# Patient Record
Sex: Female | Born: 1950 | Race: Black or African American | Hispanic: No | Marital: Married | Smoking: Never smoker
Health system: Southern US, Community
[De-identification: ages and names within clinical notes are randomized; demographics above are authoritative.]

## PROBLEM LIST (undated history)

## (undated) DIAGNOSIS — I639 Cerebral infarction, unspecified: Secondary | ICD-10-CM

## (undated) DIAGNOSIS — R531 Weakness: Secondary | ICD-10-CM

## (undated) DIAGNOSIS — J40 Bronchitis, not specified as acute or chronic: Secondary | ICD-10-CM

## (undated) DIAGNOSIS — Z803 Family history of malignant neoplasm of breast: Secondary | ICD-10-CM

## (undated) DIAGNOSIS — F32A Depression, unspecified: Secondary | ICD-10-CM

## (undated) DIAGNOSIS — R42 Dizziness and giddiness: Secondary | ICD-10-CM

## (undated) DIAGNOSIS — J029 Acute pharyngitis, unspecified: Secondary | ICD-10-CM

## (undated) DIAGNOSIS — N189 Chronic kidney disease, unspecified: Secondary | ICD-10-CM

## (undated) DIAGNOSIS — F329 Major depressive disorder, single episode, unspecified: Secondary | ICD-10-CM

## (undated) DIAGNOSIS — J349 Unspecified disorder of nose and nasal sinuses: Secondary | ICD-10-CM

## (undated) DIAGNOSIS — E785 Hyperlipidemia, unspecified: Secondary | ICD-10-CM

## (undated) DIAGNOSIS — I69359 Hemiplegia and hemiparesis following cerebral infarction affecting unspecified side: Secondary | ICD-10-CM

## (undated) DIAGNOSIS — I10 Essential (primary) hypertension: Secondary | ICD-10-CM

## (undated) DIAGNOSIS — G4730 Sleep apnea, unspecified: Secondary | ICD-10-CM

## (undated) DIAGNOSIS — M1990 Unspecified osteoarthritis, unspecified site: Secondary | ICD-10-CM

## (undated) DIAGNOSIS — R0602 Shortness of breath: Secondary | ICD-10-CM

## (undated) DIAGNOSIS — C649 Malignant neoplasm of unspecified kidney, except renal pelvis: Secondary | ICD-10-CM

## (undated) DIAGNOSIS — I69398 Other sequelae of cerebral infarction: Principal | ICD-10-CM

## (undated) DIAGNOSIS — I219 Acute myocardial infarction, unspecified: Secondary | ICD-10-CM

## (undated) HISTORY — DX: Hemiplegia and hemiparesis following cerebral infarction affecting unspecified side: I69.359

## (undated) HISTORY — DX: Unspecified disorder of nose and nasal sinuses: J34.9

## (undated) HISTORY — DX: Depression, unspecified: F32.A

## (undated) HISTORY — DX: Acute myocardial infarction, unspecified: I21.9

## (undated) HISTORY — DX: Unspecified osteoarthritis, unspecified site: M19.90

## (undated) HISTORY — DX: Hyperlipidemia, unspecified: E78.5

## (undated) HISTORY — DX: Shortness of breath: R06.02

## (undated) HISTORY — DX: Dizziness and giddiness: R42

## (undated) HISTORY — DX: Major depressive disorder, single episode, unspecified: F32.9

## (undated) HISTORY — DX: Chronic kidney disease, unspecified: N18.9

## (undated) HISTORY — DX: Other sequelae of cerebral infarction: I69.398

## (undated) HISTORY — DX: Cerebral infarction, unspecified: I63.9

## (undated) HISTORY — DX: Family history of malignant neoplasm of breast: Z80.3

## (undated) HISTORY — DX: Weakness: R53.1

## (undated) HISTORY — DX: Bronchitis, not specified as acute or chronic: J40

## (undated) HISTORY — DX: Sleep apnea, unspecified: G47.30

## (undated) HISTORY — DX: Essential (primary) hypertension: I10

## (undated) HISTORY — PX: CARPAL TUNNEL RELEASE: SHX101

## (undated) HISTORY — DX: Acute pharyngitis, unspecified: J02.9

---

## 1997-10-27 HISTORY — PX: CHOLECYSTECTOMY: SHX55

## 1999-10-28 HISTORY — PX: OTHER SURGICAL HISTORY: SHX169

## 1999-10-28 HISTORY — PX: NEPHRECTOMY: SHX65

## 2000-10-27 HISTORY — PX: ABDOMINAL HYSTERECTOMY: SHX81

## 2002-10-27 DIAGNOSIS — I219 Acute myocardial infarction, unspecified: Secondary | ICD-10-CM

## 2002-10-27 HISTORY — DX: Acute myocardial infarction, unspecified: I21.9

## 2003-08-08 ENCOUNTER — Inpatient Hospital Stay (HOSPITAL_COMMUNITY): Admission: EM | Admit: 2003-08-08 | Discharge: 2003-08-12 | Payer: Self-pay | Admitting: Emergency Medicine

## 2003-08-08 ENCOUNTER — Encounter: Payer: Self-pay | Admitting: Cardiology

## 2003-08-09 ENCOUNTER — Encounter: Payer: Self-pay | Admitting: Cardiology

## 2003-10-02 ENCOUNTER — Encounter (HOSPITAL_COMMUNITY): Admission: RE | Admit: 2003-10-02 | Discharge: 2003-12-26 | Payer: Self-pay | Admitting: Cardiology

## 2003-11-07 ENCOUNTER — Emergency Department (HOSPITAL_COMMUNITY): Admission: EM | Admit: 2003-11-07 | Discharge: 2003-11-07 | Payer: Self-pay | Admitting: Emergency Medicine

## 2004-05-03 ENCOUNTER — Encounter: Admission: RE | Admit: 2004-05-03 | Discharge: 2004-05-03 | Payer: Self-pay | Admitting: Internal Medicine

## 2004-08-07 ENCOUNTER — Inpatient Hospital Stay (HOSPITAL_COMMUNITY): Admission: EM | Admit: 2004-08-07 | Discharge: 2004-08-08 | Payer: Self-pay | Admitting: Emergency Medicine

## 2004-10-13 ENCOUNTER — Emergency Department (HOSPITAL_COMMUNITY): Admission: EM | Admit: 2004-10-13 | Discharge: 2004-10-13 | Payer: Self-pay | Admitting: Emergency Medicine

## 2004-12-20 ENCOUNTER — Ambulatory Visit (HOSPITAL_COMMUNITY): Admission: RE | Admit: 2004-12-20 | Discharge: 2004-12-20 | Payer: Self-pay | Admitting: Internal Medicine

## 2005-02-23 ENCOUNTER — Inpatient Hospital Stay (HOSPITAL_COMMUNITY): Admission: EM | Admit: 2005-02-23 | Discharge: 2005-02-25 | Payer: Self-pay | Admitting: Emergency Medicine

## 2005-04-15 ENCOUNTER — Encounter: Admission: RE | Admit: 2005-04-15 | Discharge: 2005-04-15 | Payer: Self-pay | Admitting: Otolaryngology

## 2005-12-25 ENCOUNTER — Encounter: Admission: RE | Admit: 2005-12-25 | Discharge: 2005-12-25 | Payer: Self-pay | Admitting: Cardiology

## 2005-12-30 ENCOUNTER — Encounter: Admission: RE | Admit: 2005-12-30 | Discharge: 2005-12-30 | Payer: Self-pay | Admitting: Cardiology

## 2006-05-21 ENCOUNTER — Emergency Department (HOSPITAL_COMMUNITY): Admission: EM | Admit: 2006-05-21 | Discharge: 2006-05-21 | Payer: Self-pay | Admitting: Emergency Medicine

## 2006-10-14 ENCOUNTER — Encounter: Admission: RE | Admit: 2006-10-14 | Discharge: 2006-10-14 | Payer: Self-pay | Admitting: Cardiology

## 2006-11-04 ENCOUNTER — Ambulatory Visit (HOSPITAL_COMMUNITY): Admission: RE | Admit: 2006-11-04 | Discharge: 2006-11-04 | Payer: Self-pay | Admitting: Gastroenterology

## 2006-11-04 ENCOUNTER — Encounter (INDEPENDENT_AMBULATORY_CARE_PROVIDER_SITE_OTHER): Payer: Self-pay | Admitting: Specialist

## 2006-11-16 ENCOUNTER — Ambulatory Visit: Payer: Self-pay | Admitting: Hematology and Oncology

## 2006-11-18 ENCOUNTER — Other Ambulatory Visit: Admission: RE | Admit: 2006-11-18 | Discharge: 2006-11-18 | Payer: Self-pay | Admitting: Hematology and Oncology

## 2006-11-18 ENCOUNTER — Encounter: Payer: Self-pay | Admitting: Hematology and Oncology

## 2006-11-18 LAB — CBC WITH DIFFERENTIAL/PLATELET
Eosinophils Absolute: 0.5 10*3/uL (ref 0.0–0.5)
HCT: 41.4 % (ref 34.8–46.6)
LYMPH%: 30.1 % (ref 14.0–48.0)
MONO#: 1.4 10*3/uL — ABNORMAL HIGH (ref 0.1–0.9)
NEUT#: 8.2 10*3/uL — ABNORMAL HIGH (ref 1.5–6.5)
NEUT%: 56 % (ref 39.6–76.8)
Platelets: 301 10*3/uL (ref 145–400)
WBC: 14.7 10*3/uL — ABNORMAL HIGH (ref 3.9–10.0)
lymph#: 4.4 10*3/uL — ABNORMAL HIGH (ref 0.9–3.3)

## 2006-11-20 LAB — COMPREHENSIVE METABOLIC PANEL
ALT: 35 U/L (ref 0–35)
CO2: 29 mEq/L (ref 19–32)
Calcium: 9.5 mg/dL (ref 8.4–10.5)
Chloride: 98 mEq/L (ref 96–112)
Creatinine, Ser: 0.82 mg/dL (ref 0.40–1.20)
Glucose, Bld: 214 mg/dL — ABNORMAL HIGH (ref 70–99)
Sodium: 142 mEq/L (ref 135–145)
Total Bilirubin: 0.5 mg/dL (ref 0.3–1.2)
Total Protein: 7.8 g/dL (ref 6.0–8.3)

## 2006-11-20 LAB — PROTEIN ELECTROPHORESIS, SERUM
Alpha-2-Globulin: 11 % (ref 7.1–11.8)
Gamma Globulin: 21.1 % — ABNORMAL HIGH (ref 11.1–18.8)
Total Protein, Serum Electrophoresis: 7.8 g/dL (ref 6.0–8.3)

## 2006-11-20 LAB — LACTATE DEHYDROGENASE: LDH: 158 U/L (ref 94–250)

## 2006-11-23 ENCOUNTER — Ambulatory Visit (HOSPITAL_COMMUNITY): Admission: RE | Admit: 2006-11-23 | Discharge: 2006-11-23 | Payer: Self-pay | Admitting: Hematology and Oncology

## 2006-11-24 LAB — FLOW CYTOMETRY

## 2007-08-27 ENCOUNTER — Encounter: Admission: RE | Admit: 2007-08-27 | Discharge: 2007-08-27 | Payer: Self-pay | Admitting: General Surgery

## 2007-09-03 ENCOUNTER — Ambulatory Visit (HOSPITAL_COMMUNITY): Admission: RE | Admit: 2007-09-03 | Discharge: 2007-09-03 | Payer: Self-pay | Admitting: General Surgery

## 2007-09-08 ENCOUNTER — Ambulatory Visit (HOSPITAL_BASED_OUTPATIENT_CLINIC_OR_DEPARTMENT_OTHER): Admission: RE | Admit: 2007-09-08 | Discharge: 2007-09-08 | Payer: Self-pay | Admitting: General Surgery

## 2007-09-08 ENCOUNTER — Encounter: Payer: Self-pay | Admitting: Internal Medicine

## 2007-09-19 ENCOUNTER — Ambulatory Visit: Payer: Self-pay | Admitting: Internal Medicine

## 2007-10-24 ENCOUNTER — Emergency Department (HOSPITAL_COMMUNITY): Admission: EM | Admit: 2007-10-24 | Discharge: 2007-10-24 | Payer: Self-pay | Admitting: Emergency Medicine

## 2007-11-04 ENCOUNTER — Ambulatory Visit: Payer: Self-pay | Admitting: Internal Medicine

## 2007-11-04 ENCOUNTER — Encounter: Payer: Self-pay | Admitting: Internal Medicine

## 2007-11-04 DIAGNOSIS — I219 Acute myocardial infarction, unspecified: Secondary | ICD-10-CM | POA: Insufficient documentation

## 2007-11-04 DIAGNOSIS — G4710 Hypersomnia, unspecified: Secondary | ICD-10-CM | POA: Insufficient documentation

## 2007-11-04 DIAGNOSIS — R29818 Other symptoms and signs involving the nervous system: Secondary | ICD-10-CM

## 2007-11-04 DIAGNOSIS — G4730 Sleep apnea, unspecified: Secondary | ICD-10-CM

## 2007-11-04 DIAGNOSIS — Z9089 Acquired absence of other organs: Secondary | ICD-10-CM

## 2007-11-04 DIAGNOSIS — J40 Bronchitis, not specified as acute or chronic: Secondary | ICD-10-CM

## 2007-11-04 DIAGNOSIS — Z905 Acquired absence of kidney: Secondary | ICD-10-CM | POA: Insufficient documentation

## 2007-11-08 ENCOUNTER — Encounter: Payer: Self-pay | Admitting: Internal Medicine

## 2008-01-21 ENCOUNTER — Ambulatory Visit: Payer: Self-pay | Admitting: Internal Medicine

## 2008-01-21 DIAGNOSIS — J309 Allergic rhinitis, unspecified: Secondary | ICD-10-CM | POA: Insufficient documentation

## 2008-02-17 ENCOUNTER — Encounter: Payer: Self-pay | Admitting: Internal Medicine

## 2008-04-19 ENCOUNTER — Encounter: Admission: RE | Admit: 2008-04-19 | Discharge: 2008-07-18 | Payer: Self-pay | Admitting: General Surgery

## 2008-05-09 ENCOUNTER — Ambulatory Visit (HOSPITAL_COMMUNITY): Admission: RE | Admit: 2008-05-09 | Discharge: 2008-05-10 | Payer: Self-pay | Admitting: General Surgery

## 2008-05-09 HISTORY — PX: LAPAROSCOPIC GASTRIC BANDING: SHX1100

## 2008-05-22 ENCOUNTER — Ambulatory Visit: Payer: Self-pay | Admitting: Internal Medicine

## 2008-08-03 ENCOUNTER — Encounter: Admission: RE | Admit: 2008-08-03 | Discharge: 2008-08-03 | Payer: Self-pay | Admitting: General Surgery

## 2008-10-02 ENCOUNTER — Encounter: Admission: RE | Admit: 2008-10-02 | Discharge: 2008-10-02 | Payer: Self-pay | Admitting: General Surgery

## 2008-12-18 ENCOUNTER — Ambulatory Visit: Payer: Self-pay | Admitting: Internal Medicine

## 2008-12-21 ENCOUNTER — Encounter: Admission: RE | Admit: 2008-12-21 | Discharge: 2008-12-21 | Payer: Self-pay | Admitting: General Surgery

## 2009-12-26 ENCOUNTER — Inpatient Hospital Stay (HOSPITAL_COMMUNITY): Admission: EM | Admit: 2009-12-26 | Discharge: 2009-12-28 | Payer: Self-pay | Admitting: Emergency Medicine

## 2009-12-27 ENCOUNTER — Encounter (INDEPENDENT_AMBULATORY_CARE_PROVIDER_SITE_OTHER): Payer: Self-pay | Admitting: Internal Medicine

## 2010-11-17 ENCOUNTER — Encounter: Payer: Self-pay | Admitting: General Surgery

## 2011-01-19 LAB — LIPID PANEL
Cholesterol: 214 mg/dL — ABNORMAL HIGH (ref 0–200)
Triglycerides: 74 mg/dL (ref <150)

## 2011-01-19 LAB — URINALYSIS, ROUTINE W REFLEX MICROSCOPIC
Bilirubin Urine: NEGATIVE
Ketones, ur: NEGATIVE mg/dL
Nitrite: NEGATIVE
Protein, ur: NEGATIVE mg/dL
Urobilinogen, UA: 1 mg/dL (ref 0.0–1.0)

## 2011-01-19 LAB — BASIC METABOLIC PANEL
Calcium: 9 mg/dL (ref 8.4–10.5)
Chloride: 103 mEq/L (ref 96–112)
Creatinine, Ser: 0.94 mg/dL (ref 0.4–1.2)
GFR calc Af Amer: >60 mL/min (ref >60)
Sodium: 136 mEq/L (ref 135–145)

## 2011-01-19 LAB — PROTIME-INR
INR: 1.02 (ref 0.00–1.49)
Prothrombin Time: 13.3 seconds (ref 11.6–15.2)

## 2011-01-19 LAB — CBC
MCV: 94.1 fL (ref 78.0–100.0)
RBC: 4.03 MIL/uL (ref 3.87–5.11)
RBC: 4.49 MIL/uL (ref 3.87–5.11)
RDW: 14.3 % (ref 11.5–15.5)
WBC: 9.1 10*3/uL (ref 4.0–10.5)
WBC: 9.5 10*3/uL (ref 4.0–10.5)

## 2011-01-19 LAB — POCT CARDIAC MARKERS
CKMB, poc: <1 ng/mL — ABNORMAL LOW (ref 1.0–8.0)
Myoglobin, poc: 69.6 ng/mL (ref 12–200)
Troponin i, poc: <0.05 ng/mL (ref 0.00–0.09)

## 2011-01-19 LAB — COMPREHENSIVE METABOLIC PANEL
BUN: 9 mg/dL (ref 6–23)
CO2: 25 mEq/L (ref 19–32)
Calcium: 9.3 mg/dL (ref 8.4–10.5)
Chloride: 101 mEq/L (ref 96–112)
Creatinine, Ser: 0.92 mg/dL (ref 0.4–1.2)
GFR calc non Af Amer: >60 mL/min (ref >60)
Total Bilirubin: 0.7 mg/dL (ref 0.3–1.2)

## 2011-01-19 LAB — GLUCOSE, CAPILLARY
Glucose-Capillary: 106 mg/dL — ABNORMAL HIGH (ref 70–99)
Glucose-Capillary: 115 mg/dL — ABNORMAL HIGH (ref 70–99)
Glucose-Capillary: 167 mg/dL — ABNORMAL HIGH (ref 70–99)
Glucose-Capillary: 205 mg/dL — ABNORMAL HIGH (ref 70–99)

## 2011-01-19 LAB — CK TOTAL AND CKMB (NOT AT ARMC): Total CK: 106 U/L (ref 7–177)

## 2011-01-19 LAB — URINE MICROSCOPIC-ADD ON

## 2011-01-19 LAB — HEMOGLOBIN A1C: Mean Plasma Glucose: 177 mg/dL

## 2011-01-19 LAB — APTT: aPTT: 22 seconds — ABNORMAL LOW (ref 24–37)

## 2011-03-11 NOTE — Op Note (Signed)
[name], [name]            ACCOUNT NO.:  [id number]   MEDICAL RECORD NO.:  [id number]          PATIENT TYPE:  AMB   LOCATION:  DAY                          FACILITY:  [facility]   PHYSICIAN:  Sharlet Salina T. Hoxworth, M.D.DATE OF BIRTH:  Nov 26, 1950   DATE OF PROCEDURE:  05/09/2008  DATE OF DISCHARGE:                               OPERATIVE REPORT   PREOPERATIVE DIAGNOSIS:  Morbid obesity.   POSTOPERATIVE DIAGNOSIS:  Morbid obesity.   SURGICAL PROCEDURES:  Placement of laparoscopic adjustable gastric band.   SURGEON:  Lorne Skeens. Hoxworth, M.D.   ANESTHESIA:  General.   ASSISTANT:  Dr. Luretha Murphy.   BRIEF HISTORY:  [name] is a 60 year old female with progressive  morbid obesity unresponsive to medical management and multiple  comorbidities.  Follow-up following extensive discussion detailed  elsewhere, we have elected to proceed with placement laparoscopic  adjustable gastric band for treatment of her morbid obesity.   DESCRIPTION OF OPERATION:  The patient brought to the operating room and  placed in supine position on the operating table and general anesthesia  was induced.  She received preoperative antibiotics and subcutaneous  heparin.  PAS were placed.  It was widely sterilely prepped and draped.  Correct patient and procedure were verified.  The patient had a previous  open splenectomy and nephrectomy with a large left subcostal incision.  I therefore gained access in the right upper quadrant midclavicular line  with 11 mm OptiVu trocar without difficulty and pneumoperitoneum  established.  There were fairly extensive adhesions to the anterior  abdominal wall and to the left upper quadrant incision but these were  omental adhesions and for the most part fairly filmy.  Under direct  vision I placed a 11 mm trocar just above and to the left of the  umbilicus and through this port took down the anterior adhesions of the  omentum until the stomach was exposed.   There was some adhesions up  around the fundus from the splenectomy, but not severe.  Through a 5-mm  site a Davidson retractor was placed to the epigastrium and the left  lobe of liver elevated with good exposure.  An additional 15 mm trocar  was placed well laterally in the right upper quadrant.  Another 11 mm  trocar was placed in the left upper quadrant.  The angle of His was  dissected and the peritoneum overlying the left crus incised and the  finger dissector used to dissect bluntly back toward the retrogastric  area.  The pars flaccida was then opened along the avascular area and  the base of the right crus identified in an area of crossing fat.  This  was opened and the finger dissector used to bluntly go retrogastric  without difficulty and it was deployed up to the previous dissected area  at the angle of His.  An AP Standard flush band system was introduced  and the tubing passed into the finger dissector which was then brought  back retrogastric and the band brought back around the stomach without  difficulty.  The calibration tube was placed orally into the stomach and  the band was buckled into place without any undue tension.  The  calibration tube was removed and holding the tubing toward the patient's  feet exposing the small gastric pouch, the fundus was imbricated up over  the band to the small gastric pouch with three interrupted 2-0 Ethibond  sutures.  This did require a little further mobilization of the fundus  which was done without difficulty.  The operative site was inspected for  hemostasis which appeared complete.  The band was in good position and  rotated freely.  The tubing was brought out through the right mid trocar  site.  The Nathanson's retractor was removed under direct vision.  All  CO2 was evacuated and trocars removed.  Right mid abdominal incision was  lengthened slightly and the anterior fascia exposed.  Four 2-0 Prolene  sutures were placed for  securing the port.  The tubing was cut, attached  the port which was then affixed to the anterior fascia with the sutures.  The tubing was seen to curve nicely into the abdomen.  All incisions  were irrigated with saline.  The subcu port site was closed with running  3-0 Vicryl and skin incisions were closed with subcuticular Monocryl and  Dermabond.  Sponge, needle and instrument counts were correct.  The  patient was taken recovery in good condition.      Lorne Skeens. Hoxworth, M.D.  Electronically Signed     BTH/MEDQ  D:  05/09/2008  T:  05/09/2008  Job:  [id number]

## 2011-03-11 NOTE — Procedures (Signed)
[name], [name]            ACCOUNT NO.:  [id number]   MEDICAL RECORD NO.:  [id number]          PATIENT TYPE:  OUT   LOCATION:  SLEEP CENTER                 FACILITY:  [facility]   PHYSICIAN:  Clinton D. Maple Hudson, MD, FCCP, FACPDATE OF BIRTH:  Mar 02[birth year] 1953   DATE OF STUDY:  09/08/2007                            NOCTURNAL POLYSOMNOGRAM   REFERRING PHYSICIAN:  Sharlet Salina T. Hoxworth, M.D.   INDICATIONS FOR PROCEDURE:  Hypersomnia with sleep apnea.   RESULTS:  Epward sleepiness score 15/24. BMI 44.8. Weight 237 pounds.  Height 61 inches. Neck 14 inches.   HOME MEDICATIONS:  Listed and reviewed.   A previous outside sleep report from January 11, 2003 recorded an AHI of  20 per hour with desaturation to 73% and patient reportedly subsequently  was fitted with CPAP, now for re-evaluation, anticipating surgery.   SLEEP ARCHITECTURE:  Total sleep time 381 minutes with sleep efficiency  86%. Stage 1 was 7%; Stage 2, 80%; Stage 3 absent. REM 12% of total  sleep time. Sleep latency 6 minutes, REM latency 269 minutes. Awake  after sleep onset 54 minutes. Arousal index 6.4.   RESPIRATORY DATA:  CPAP titration protocol. CPAP was titrated to 14 CWP,  AHI 1 per hour. A small Mirage Quattro full face mask was used with  heated humidifier.   OXYGEN DATA:  Mean oxygen saturation with CPAP was 95% on room air.   CARDIAC DATA:  Sinus rhythm with PVC's.   MOVEMENT/PARASOMNIA:  No significant movement disturbance. No bathroom  trips.   IMPRESSION/RECOMMENDATIONS:  1. Successful CPAP titration to 14 CWP, AHI 1 per hour. A small Mirage      Quattro mask was used with heated humidifier.  2. Diagnostic NP SG on January 11, 2003, elsewhere, had recorded an AHI      of 20 per hour.     Clinton D. Maple Hudson, MD, [facility], FACP  Diplomate, Biomedical engineer of Sleep Medicine  Electronically Signed    CDY/MEDQ  D:  09/19/2007 13:57:09  T:  09/19/2007 19:35:55  Job:  [id number]

## 2011-03-14 NOTE — H&P (Signed)
NAMEELESE, Erica NO.:  [id number]   MEDICAL RECORD NO.:  [id number]          PATIENT TYPE:  INP   LOCATION:  3733                         FACILITY:  [facility]   PHYSICIAN:  Cassell Clement, M.D. DATE OF BIRTH:  [birth year]   DATE OF ADMISSION:  08/07/2004  DATE OF DISCHARGE:                                HISTORY & PHYSICAL   CHIEF COMPLAINT:  Chest pain.   HISTORY:  This is a 61 year old married African American female admitted  with left chest pain. The pain started about 11 p.m. last evening while  watching television. It lasted for about four hours until she was able to  fall asleep. She tried taking nitroglycerin times two with no help. She  noted dyspnea. She noted radiation of the pain to the neck, down the left  arm, and through to the back.  There was no vomiting. There was mild nausea  and diaphoresis. The pain was better this morning, but then recurred at  about 11:00 this morning and she came to the emergency room. She does have a  past history of known coronary artery disease and had a lateral myocardial  infarction on August 11, 2003, with cardiac catheterization and stent by  Dr. Deborah Chalk. She was found to have an occluded or nearly occluded  intermediate coronary artery which was opened and stented. She had  subsequent adenosine Cardiolite stress test, February 06, 2004, which showed a  fixed lateral defect and equivocal lateral redistribution with a normal  ejection fraction of 63%.   Her family history reveals that her father died of a bad heart, diabetes,  and Alzheimer disease. Her mother is still living at age 8, but has high  blood pressure.   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 She is married and has two children. She does not use alcohol or  tobacco. She has no known medication allergies.   PAST MEDICAL HISTORY:  Diabetes for about 17 years and hypertension for  about 25 years. She has had a  prior left nephrectomy and splenectomy, but  she is unable to tell me what the cause of the surgery was. She has a  history of depression and sees a psychiatrist, Dr. Raquel James. The remainder of  the review of systems is negative in detail. She does have a history of GERD  and is on Protonix.   PHYSICAL EXAMINATION:  VITAL SIGNS: Blood pressure 122/60, pulse 70 and  regular, respirations normal.  GENERAL: This is an obese woman in no acute distress.  HEENT/NECK: Unremarkable. Carotids normal. Jugular venous pressure normal.  Thyroid normal.  CHEST: Clear.  HEART: Quiet precordium. There is no murmurs, rubs, or gallops. The patient  is very tender to palpation over the costochondral junction suggesting  costochondritis.  ABDOMEN: Soft. There is no mass. There is an old left upper quadrant scar.  EXTREMITIES: Good peripheral pulses. No edema, no phlebitis.   Electrocardiogram on admission showed minor nonspecific ST-T wave  abnormalities, but no acute abnormalities. Chest x-ray done in the emergency  room is pending my review.  LABORATORY DATA:  Negative cardiac enzymes, point of care twice in the  emergency room and once subsequent. She has normal renal function with a BUN  of 17, creatinine 1.0, and her blood sugar is 70.   IMPRESSION:  1.  Chest pain. Rule out myocardial infarction which is doubtful and I      suspect probable acute costochondritis.  2.  Coronary artery disease with a remote lateral wall myocardial infarction      with a history of catheterization and stent to intermediate coronary      vessel on August 11, 2003.  3.  Diabetes mellitus, insulin dependent.  4.  Hypertensive cardiovascular disease.  5.  Exogenous obesity.  6.  Depression.   DISPOSITION:  Will observe overnight on telemetry. Will continue with IV  heparin. Will get serial enzymes and EKGs. Will give her a trial of  ibuprofen for costochondritis. We will continue her Protonix to protect the   stomach.       TB/MEDQ  D:  08/07/2004  T:  08/07/2004  Job:  [id number]   cc:   Colleen Can. Deborah Chalk, M.D.  Fax: [phone]   Fleet Contras, M.D.  [address]  [place]  [place] [address]  Fax: [phone]

## 2011-03-14 NOTE — H&P (Signed)
[name], [name] NO.:  [id number]   MEDICAL RECORD NO.:  [id number]                   PATIENT TYPE:  INP   LOCATION:  2023                                 FACILITY:  [facility]   PHYSICIAN:  Colleen Can. Deborah Chalk, M.D.            DATE OF BIRTH:  1951/08/30   DATE OF ADMISSION:  08/08/2003  DATE OF DISCHARGE:                                HISTORY & PHYSICAL   CHIEF COMPLAINT:  Chest pain.   HISTORY OF PRESENT ILLNESS:  The patient is an obese black female who is 60  years old.  She presents to the emergency department from Dr. Malon Kindle  office on the evening of October 12th.  She has had chest pain that  basically started this past Saturday.  It has waxed and waned.  She  describes it as a tight-like feeling, but she does have chest wall  tenderness.  She has had associated shortness of breath as well as  diaphoresis.  It seemingly is exacerbated with exertion and improved with  rest.  She has been using Tums intermittently with only short-term relief.  She now presents to the emergency room for further evaluation and  management.   PAST MEDICAL/SURGICAL HISTORY:  1. Obesity.  2. Diabetes x10-12 years.  3. Hypertension.  4. Depression.  5. Previous left nephrectomy for cancer in approximately 2002.  6. Status post cholecystectomy, splenectomy, hysterectomy, appendectomy and     right hand carpal tunnel surgery.   ALLERGIES:  None.   MEDICATIONS:  1. Avandia 8 mg.  2. Glucovance 10-500 twice a day.  3. Lotrel 5-20.  4. Effexor 150.   FAMILY HISTORY:  Father died with diabetes and heart failure; mother is  alive in her 66's and has no report to heart problems.   SOCIAL HISTORY:  Nonsmoker, no alcohol use.  She has been married for 14  years.  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   REVIEW OF SYSTEMS:  She has had some chronic tinnitus, but no recent URI, no  fever, no fluid, no cough, no  abdominal pain.  She denies any injury to the  chest wall.  She has had no constipation, no diarrhea, no complaints of  edema, no syncope, presyncope and otherwise, review of systems is as noted  above and otherwise, unremarkable.   PHYSICAL EXAMINATION:  GENERAL:  A very pleasant, obese black female in no  acute distress.  VITAL SIGNS:  Blood pressure 144/85, heart rate 93, respirations 18.  She is  afebrile.  O2 saturation was 98%.  SKIN:  Warm and dry.  Color is unremarkable.  LUNGS:  Basically clear.  CARDIAC:  Regular rhythm.  She does have positive chest wall tenderness with  just minimal palpation.  ABDOMEN:  Obese, soft, positive bowel sounds, nontender.  EXTREMITIES:  Without edema.  NEUROLOGIC:  No gross focal deficit.   LABORATORY DATA:  Pertinent labs are pending.   OVERALL IMPRESSION:  1. Chest pain with both an exertional component as well as chest wall     tenderness, somewhat atypical for coronary disease.  2. Diabetes.  3. Hypertension.  4. Obesity.  5. History of nephrectomy for renal cell carcinoma.   PLAN:  She will be admitted, will follow her labs, will add PPI and  Darvocet.  Will initially plan for her to have Cardiolite study in light of  her soul kidney.  She may actually indeed, need cardiac catheterization.        Juanell Fairly C. Earl Gala, N.P.                 Colleen Can. Deborah Chalk, M.D.    LCO/MEDQ  D:  08/09/2003  T:  08/09/2003  Job:  [id number]   cc:   Jethro Bastos, M.D.  [address]  [place]  [place] [address]  Fax: [phone]

## 2011-03-14 NOTE — Discharge Summary (Signed)
NAMEVICI, NOVICK            ACCOUNT NO.:  [id number]   MEDICAL RECORD NO.:  [id number]          PATIENT TYPE:  INP   LOCATION:  3713                         FACILITY:  [facility]   PHYSICIAN:  Colleen Can. Deborah Chalk, M.D.DATE OF BIRTH:  [birth year]   DATE OF ADMISSION:  02/23/2005  DATE OF DISCHARGE:  02/25/2005                                 DISCHARGE SUMMARY   PRIMARY DISCHARGE DIAGNOSIS:  Chest pain with subsequent negative adenosine  Cardiolite study with no evidence of ischemia, ejection fraction is 58%.   SECONDARY DISCHARGE DIAGNOSES:  1.  Known atherosclerotic cardiovascular disease with previous history of a      lateral wall myocardial infarction suffered on August 11, 2003 with a      catheterization and stent placement per Dr. Roger Shelter to an      occluded intermediate coronary artery.  2.  Obesity.  3.  Diabetes.  4.  Gastroesophageal reflux disease.  5.  Hyperlipidemia.  6.  Anxiety and depression.  7.  Hypertension.  8.  Previous history of a left nephrectomy.  9.  History of splenectomy.   HISTORY OF PRESENT ILLNESS:  The patient is a pleasant obese black female  who presents to the emergency room on February 23, 2005 with complaints of  chest pain.  She has had chest pain off and on over the past several weeks  that has been occurring in her back as well as the left side of her chest  and in the midsternal region.  She describes it as a stabby-like discomfort,  but on the day prior to admission, she had developed chest pressure that was  similar to her previous chest pain syndrome.  She presented to the emergency  room, where her cardiac markers were negative x3; she was subsequently  admitted to the rule-out unit for further evaluation.   Please see the history and physical per Dr. Armanda Magic for further patient  presentation and profile.   LABORATORY DATA:  Her cardiac markers were negative.  I-STAT was basically  satisfactory.  CBC was normal.   Twelve-lead electrocardiogram showed nonspecific T wave abnormalities with  normal sinus rhythm.   HOSPITAL COURSE:  The patient was admitted.  She was placed on IV  nitroglycerin as well as subcutaneous Lovenox.  She ruled out negative for  myocardial infarction.  We proceeded on with stress Cardiolite study the  following day on Feb 24, 2005; this was tolerated well without any problems.  The results showed no evidence of ischemia.  Ejection fraction was 58%.  She  subsequently had her IV nitroglycerin weaned and had her oral nitrate  therapy restarted and today, on Feb 25, 2005, she is doing well and she had  no further bouts of chest pain.  She has been ambulatory without problems.  Her rhythm has remained stable.  She did have a brief episode of her heart  rate down into the low 30s while she was soundly sleeping and was basically  asymptomatic.  Overall, it is felt that she is stable for discharge and will  continue with outpatient evaluation.   DISCHARGE CONDITION:  Stable.   DISCHARGE MEDICINES:  1.  Actos 30 mg a day.  2.  Valium p.r.n.  3.  Plavix 75 mg a day.  4.  Wellbutrin 100 mg a day.  5.  Insulin as before.  6.  Glucovance 5/500 daily.  7.  Lotrel 10/20 daily.  8.  Protonix 40 mg a day.  9.  Imdur 60 mg a day.  10. Vytorin as before.  11. Atenolol as before.   DIET:  Her diet is to be diabetic, heart-healthy.   FOLLOWUP:  We will see her back in the office in approximately 1 week; she  is asked to call the office and schedule that appointment.  She is asked to  bring all of her medicine bottles to that appointment as well.      LC/MEDQ  D:  02/25/2005  T:  02/25/2005  Job:  [id number]

## 2011-03-14 NOTE — Op Note (Signed)
[name], [name]            ACCOUNT NO.:  [id number]   MEDICAL RECORD NO.:  [id number]          PATIENT TYPE:  AMB   LOCATION:  ENDO                         FACILITY:  [facility]   PHYSICIAN:  Shirley Friar, MDDATE OF BIRTH:  1951-10-07   DATE OF PROCEDURE:  11/04/2006  DATE OF DISCHARGE:                               OPERATIVE REPORT   PROCEDURE:  Colonoscopy.   INDICATION:  Family history of colon polyps.   MEDICATIONS:  Fentanyl 125 mcg IV, Versed 12.5 mg IV.   FINDINGS:  Rectal exam was normal.  The adult colonoscope was inserted  into an adequately-prepped colon and advanced to the cecum, where the  ileocecal valve and appendiceal orifice were identified.  During  intubation of the cecum a small amount of superficial punctate  hemorrhages occurred from scope suction.  A 3 mm sessile polyp was noted  in the cecal base, and this was were removed with cold biopsy.  On  careful withdrawal of the colonoscope a 2 mm the ascending colon polyp  was noted, and this was removed with cold biopsy.  No other polyps were  seen.  Shallow left-sided diverticulosis was noted.  Retroflexion was  unremarkable.   ASSESSMENT:  1. Colon polyps x2, status post cold biopsy removal.  2. Scattered left-sided diverticulosis.   PLAN:  1. Follow up on pathology.  2. Resume Plavix secondary to history of coronary stents as recommend      by cardiology.  3. Repeat colonoscopy in 3-5 years.  4. High-fiber diet.      Shirley Friar, MD  Electronically Signed     VCS/MEDQ  D:  11/04/2006  T:  11/04/2006  Job:  [id number]   cc:   Colleen Can. Deborah Chalk, M.D.  Fleet Contras, M.D.

## 2011-03-14 NOTE — Discharge Summary (Signed)
NAMEMAEBRY, OBRIEN                        ACCOUNT NO.:  [id number]   MEDICAL RECORD NO.:  [id number]                   PATIENT TYPE:  INP   LOCATION:  6524                                 FACILITY:  [facility]   PHYSICIAN:  Colleen Can. Deborah Chalk, M.D.            DATE OF BIRTH:  02-01-1951   DATE OF ADMISSION:  08/08/2003  DATE OF DISCHARGE:  08/12/2003                                 DISCHARGE SUMMARY   PRIMARY DISCHARGE DIAGNOSIS:  Lateral wall myocardial infarction with  occluded intermediate coronary artery; moderate diffuse 3 vessel disease per  cardiac catheterization; and successful Stent placement to the intermediate  coronary.   SECONDARY DISCHARGE DIAGNOSES:  1. Obesity.  2. Diabetes.  3. Hypertension.  4. History of depression.  5. Hypertension.   HISTORY OF PRESENT ILLNESS:  The patient is a very pleasant 60 year old  obese black female who presents to the emergency department from Dr.  Malon Kindle office on the evening of August 08, 2003. She has recently moved  to the [place] area from [place]. She had recently attended the [employer]coming game where she began having chest pain. It basically waxed and  waned. It was described as a tight-like feeling. She did have, however,  associated chest wall tenderness. She also noted to be shortness of breath.  Her discomfort was exacerbated with exertion as well as relieved with rest.  However, was also relieved with Tums for short term resolution. She  subsequently presented to the emergency room and was admitted for further  medical management.   Please see the dictated history and physical for further patient  presentation and profile.   LABORATORY DATA:  On admission, cardiac enzymes were negative x2. Troponin,  however, 0.36 to 0.36. Chemistries were within normal limits. CBC showed  white count 12.5, hemoglobin 12, hematocrit 36. Platelets were 338,000. PT  and PTT were unremarkable.   PROCEDURE:  Adenosine  Cardiolite showing a small fixed perfusion defect to  the mid anterior wall with a moderate focus of reversible perfusion adjacent  to the fixed defect. This extended from the mid anterior lateral wall to the  lateral wall of the apex. The left ventricular cavity size appeared to be  within normal limits. Ejection fraction was calculated to be 48.   HOSPITAL COURSE:  The patient was admitted to telemetry. She was placed on  nitrates as well as anti-coagulation with heparin. We proceeded initially  with Cardiolite study, with the results as noted above. In light of these  findings, we proceeded on with cardiac catheterization. The patient does  have prior history of nephrectomy and was hydrated and then we proceeded on  with cardiac catheterization on August 11, 2003. With that, she  demonstrated mild anterior hypokinesis. The left main was normal. The  intermediate was 100% occluded. The left anterior descending has diffuse  irregularities of the 60% to 70% nature. The right coronary artery is small  with diffuse  60% to 70% proximal narrowings. There is a large obtuse  marginal branch with 30% to 40% narrowing. A subsequent 2.5 x 18 mm Cypher  stent was placed to the intermediate with an overall satisfactory result  obtained. Post procedure, she was transferred back to telemetry and by  August 12, 2003, she was doing well without complaints. She was felt to be  a stable candidate for discharge.   CONDITION ON DISCHARGE:  Stable.   DISCHARGE MEDICATIONS:  She will resume her Effexor, Lotrel, and Avandia as  she was taking before. She is given the okay to restart her Glucovance as of  Monday following discharge. New medicines include:  1. Lipitor 10 q.d.  2. Plavix 75 mg a day for 6 months.  3. Aspirin daily.  4. Lopressor 25 b.i.d.  5. Nitroglycerin p.r.n.  6. Protonix 40 mg a day.   ACTIVITY:  To be light until she is seen back in the office. She is to place  an ice pack to the  groin as needed.   FOLLOW UP:  Will plan on seeing her back in 2 to 3 weeks and certainly  sooner if problems would arise in the interim.      Juanell Fairly C. Earl Gala, N.P.                 Colleen Can. Deborah Chalk, M.D.    LCO/MEDQ  D:  09/09/2003  T:  09/09/2003  Job:  [id number]   cc:   Jethro Bastos, M.D.  [address]  [place]  [place] [address]  Fax: [phone]   Gwen Pounds, M.D.  [address]  [place]  [place] [address]  Fax: [phone]

## 2011-03-14 NOTE — Cardiovascular Report (Signed)
NAMELENISE, JR                        ACCOUNT NO.:  [id number]   MEDICAL RECORD NO.:  [id number]                   PATIENT TYPE:  INP   LOCATION:  6524                                 FACILITY:  [facility]   PHYSICIAN:  Colleen Can. Deborah Chalk, M.D.            DATE OF BIRTH:  [birth year]   DATE OF PROCEDURE:  08/11/2003  DATE OF DISCHARGE:  08/12/2003                              CARDIAC CATHETERIZATION   HISTORY:  Ms. Rankin is a 60 year old female with a history of diabetes  and obesity.  She presents with approximately a three-day history of chest  pain.  She had positive troponins but with negative CK-MBs.  She is referred  for catheterization after having a positive adenosine Cardiolite study.   PROCEDURE:  Left heart catheterization with selective coronary angiography,  left ventricular angiography with stent placement in the intermediate  coronary.   TYPE AND SITE OF ENTRY:  Percutaneous, right femoral artery.   CATHETERS:  6-French 4-curved Judkins right and left coronary catheters; 6-  French pigtail ventriculographic catheter; 6-French FL4 guide, Hi-Torque  Floppy guidewire, 2.5 x 15-mm Maverick balloon, subsequently a 2.5 x 18-mm  CYPHER stent.   CONTRAST MATERIAL:  Omnipaque.   MEDICATIONS GIVEN PRIOR TO THE PROCEDURE:  Valium 10 mg p.o.   MEDICATIONS GIVEN DURING THE PROCEDURE:  Versed 2 mg IV, heparin,  Integrilin.   COMMENTS:  The patient tolerated the procedure well.   HEMODYNAMIC DATA:  1. The aortic pressure was 140/84.  2. The LV was 134/11-19.  3. There was no aortic valve gradient noted on pullback.   ANGIOGRAPHIC DATA:  1. Left main coronary artery was normal.  2. Left anterior descending:  The left anterior descending was a reasonably     large vessel that crossed the apex.  There was 60% narrowing distally.  3. Left circumflex:  The left circumflex continued in the AV groove.  There     was a bifurcating branch.  One of the distal branches had  60-70%     narrowing.  4. Intermediate:  There were two large to moderate-sized intermediate     branches.  A large intermediate vessel had 30-40% narrowings proximally.     It had three distal branches with irregularities but no significant focal     disease.  A second intermediate-type vessel was totally occluded and     filled with retrograde collaterals.  5. Right coronary artery:  The right coronary artery was a small,     nondominant vessel.  There was diffuse 60-70% narrowing proximally.  This     would not be suitable for angioplasty because of the small nature of the     artery.   Left ventricular angiogram was performed in the RAO position.  Overall  cardiac size and silhouette are normal.  There was anterior hypokinesis and  mild distal inferior hypokinesis.  Global ejection fraction would be  estimated to be in the  50% range.   ANGIOPLASTY PROCEDURE:  We turned our attention to the severely stenotic or  occluded intermediate vessel.  There was antegrade flow, and this was met  with retrograde flow, making it hard to tell if the vessel was totally  occluded or just severely stenotic.  We used an FL4 guide, Hi-Torque Floppy  guidewire initially passed with a 2.5 x 15-mm Maverick balloon.  This was  inflated but did have difficulty without moving in location with inflation.  We then returned with a 2.5 x 18-mm CYPHER stent.  This was inflated to a  maximum of 14 atm.  The final angiographic result was felt to be excellent,  with good distal flow and appropriate lumen size.   OVERALL IMPRESSION:  1. Lateral myocardial infarction with occluded intermediate coronary.  2. Moderate diffuse three-vessel coronary atherosclerosis.  3. Successful stent placement in the intermediate coronary artery.                                               Colleen Can. Deborah Chalk, M.D.    SNT/MEDQ  D:  08/11/2003  T:  08/12/2003  Job:  [id number]   cc:   Jethro Bastos, M.D.  [address]  [place]  [place] [address]  Fax: [phone]

## 2011-03-14 NOTE — Discharge Summary (Signed)
[name], [name] NO.:  [id number]   MEDICAL RECORD NO.:  [id number]          PATIENT TYPE:  INP   LOCATION:  3733                         FACILITY:  [facility]   PHYSICIAN:  Cassell Clement, M.D. DATE OF BIRTH:  1951-04-25   DATE OF ADMISSION:  08/07/2004  DATE OF DISCHARGE:  08/08/2004                                 DISCHARGE SUMMARY   FINAL DIAGNOSES:  1.  Chest pain, myocardial infarction ruled out.  2.  Probable costochondritis.  3.  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  4.  Diabetes.  5.  Hypertension.  6.  Exogenous obesity.  7.  Depression.   HISTORY:  This 60 year old African American female was admitted through the  emergency room with left chest pain on August 07, 2004.  She had had the  onset of the pain the previous evening while watching television.  She had  taken nitroglycerin with no improvement.  The pain resolved and then  recurred on the morning of admission.  She has a history of known coronary  disease with a prior lateral myocardial infarction on August 11, 2003,  requiring a stent to a nearly totally occluded intermediate coronary artery  by Dr. Deborah Chalk.  She had a two-day adenosine Cardiolite stress test on February 06, 2004, in the office showing a fixed lateral deficit, equivocal lateral  redistribution and normal EF of 63%.   PHYSICAL EXAMINATION:  The physical exam on admission was significant,  except for significant tenderness along the left costochondral junctions of  the chest wall.  Vital signs on admission were stable.   LABORATORY DATA:  Her electrocardiogram on admission showed only minor  nonspecific ST-T wave abnormalities, but no acute change.  Cardiac enzymes  initially were negative.  BUN 17 and creatinine 1.0.   HOSPITAL COURSE:  The patient was admitted to 3700.  She was placed on  telemetry.  Her rhythm remained stable.  She was placed on IV heparin.  She  was observed overnight and  serial enzymes x 4 showed no evidence of  myocardial damage.  She was treated empirically for her costochondritis  chest wall pain with ibuprofen with good results overnight and by the  following morning she was ready for discharge.   DISCHARGE MEDICATIONS:  She is being discharged on the same medications as  admission with the addition of Nexium 40 mg one daily for prophylaxis  against GERD and on ibuprofen 200 mg three times a day p.r.n.   FOLLOWUP:  She is to follow up with Dr. Deborah Chalk in two weeks.   CONDITION ON DISCHARGE:  Improved.       TB/MEDQ  D:  08/08/2004  T:  08/08/2004  Job:  [id number]   cc:   Colleen Can. Deborah Chalk, M.D.  Fax: [phone]   Fleet Contras, M.D.  [address]  [place]  [place] [address]  Fax: [phone]

## 2011-03-17 ENCOUNTER — Other Ambulatory Visit: Payer: Self-pay | Admitting: Orthopaedic Surgery

## 2011-03-17 ENCOUNTER — Ambulatory Visit (HOSPITAL_COMMUNITY)
Admission: RE | Admit: 2011-03-17 | Discharge: 2011-03-17 | Disposition: A | Discharge status: Home / Self Care | Payer: BC Managed Care – PPO | Source: Ambulatory Visit | Attending: Orthopaedic Surgery | Admitting: Orthopaedic Surgery

## 2011-03-17 ENCOUNTER — Other Ambulatory Visit (HOSPITAL_COMMUNITY): Payer: Self-pay | Admitting: Orthopaedic Surgery

## 2011-03-17 ENCOUNTER — Encounter (HOSPITAL_COMMUNITY): Payer: BC Managed Care – PPO

## 2011-03-17 DIAGNOSIS — Z01818 Encounter for other preprocedural examination: Secondary | ICD-10-CM

## 2011-03-17 DIAGNOSIS — Z01810 Encounter for preprocedural cardiovascular examination: Secondary | ICD-10-CM | POA: Insufficient documentation

## 2011-03-17 DIAGNOSIS — I10 Essential (primary) hypertension: Secondary | ICD-10-CM | POA: Insufficient documentation

## 2011-03-17 DIAGNOSIS — Z01812 Encounter for preprocedural laboratory examination: Secondary | ICD-10-CM | POA: Insufficient documentation

## 2011-03-17 LAB — BASIC METABOLIC PANEL
Calcium: 9.4 mg/dL (ref 8.4–10.5)
Creatinine, Ser: 0.89 mg/dL (ref 0.4–1.2)
GFR calc Af Amer: >60 mL/min (ref >60)

## 2011-03-17 LAB — CBC
Platelets: 300 10*3/uL (ref 150–400)
RDW: 14.8 % (ref 11.5–15.5)
WBC: 12.7 10*3/uL — ABNORMAL HIGH (ref 4.0–10.5)

## 2011-03-17 LAB — PROTIME-INR
INR: 0.98 (ref 0.00–1.49)
Prothrombin Time: 13.2 seconds (ref 11.6–15.2)

## 2011-03-17 LAB — SURGICAL PCR SCREEN
MRSA, PCR: NEGATIVE
Staphylococcus aureus: NEGATIVE

## 2011-03-20 ENCOUNTER — Ambulatory Visit (HOSPITAL_COMMUNITY)
Admission: RE | Admit: 2011-03-20 | Discharge: 2011-03-20 | Disposition: A | Discharge status: Home / Self Care | Payer: BC Managed Care – PPO | Source: Ambulatory Visit | Attending: Orthopaedic Surgery | Admitting: Orthopaedic Surgery

## 2011-03-20 ENCOUNTER — Other Ambulatory Visit: Payer: Self-pay | Admitting: Orthopaedic Surgery

## 2011-03-20 DIAGNOSIS — Z01812 Encounter for preprocedural laboratory examination: Secondary | ICD-10-CM | POA: Insufficient documentation

## 2011-03-20 DIAGNOSIS — D2120 Benign neoplasm of connective and other soft tissue of unspecified lower limb, including hip: Secondary | ICD-10-CM | POA: Insufficient documentation

## 2011-03-20 DIAGNOSIS — I739 Peripheral vascular disease, unspecified: Secondary | ICD-10-CM | POA: Insufficient documentation

## 2011-03-20 DIAGNOSIS — G4733 Obstructive sleep apnea (adult) (pediatric): Secondary | ICD-10-CM | POA: Insufficient documentation

## 2011-03-20 DIAGNOSIS — I2510 Atherosclerotic heart disease of native coronary artery without angina pectoris: Secondary | ICD-10-CM | POA: Insufficient documentation

## 2011-03-20 DIAGNOSIS — E119 Type 2 diabetes mellitus without complications: Secondary | ICD-10-CM | POA: Insufficient documentation

## 2011-03-20 LAB — GLUCOSE, CAPILLARY

## 2011-03-22 NOTE — Op Note (Signed)
  [name], [name]            ACCOUNT NO.:  [id number]  MEDICAL RECORD NO.:  [id number]           PATIENT TYPE:  O  LOCATION:  DAYL                         [facility]ACILITY:  [facility]  PHYSICIAN:  Vanita Panda. Magnus Ivan, M.D.DATE OF BIRTH:  11-17-1950  DATE OF PROCEDURE:  03/20/2011 DATE OF DISCHARGE:  03/20/2011                              OPERATIVE REPORT   PREOPERATIVE DIAGNOSIS:  Right anterior ankle soft tissue mass.  POSTOPERATIVE DIAGNOSIS:  Right anterior ankle soft tissue mass.  PROCEDURE:  Excision of right ankle soft tissue mass.  FINDINGS:  Small less than 3 cm mass in the anterior soft tissues, possible giant cell tumor, tendon sheath; pathology pending.  SURGEON:  Vanita Panda. Magnus Ivan, M.D.  ANESTHESIA: 1. General via LMA. 2. Local with 0.25% plain Sensorcaine.  BLOOD LOSS:  Minimal.  COMPLICATIONS:  None.  INDICATIONS:  [name] is a 60 year old female with diabetes, heart disease, and peripheral vascular disease.  She has developed a right ankle soft tissue mass in the soft tissues of the anterior aspect of her ankle __________ and found to be a solid mass.  I recommended she undergo excision of this mass.  The risks and benefits of this were explained and she did wish to proceed with surgery.  Given the fact that was hurting her as far as shoe wear.  PROCEDURE DESCRIPTION:  After informed consent was obtained, appropriate right ankle was marked.  She was brought to the operating room, placed supine on the operating table.  General anesthesia has been obtained. The right ankle was prepped and draped with DuraPrep and sterile drapes. Time-out was called and she was identified as a correct patient, correct right ankle.  I made an incision directly over this small soft tissue mass and I was able to dissect around the mass and removed it from its entirety.  It looked like it was associated with the anterior tendon. There was no joint involvement and no  invasions of the soft tissue surrounding bone that I could see.  I passed this off the table for prominent identification through pathology.  I then copiously irrigated the tissues and closed with 2- 0 Vicryl and followed by 4-0 Vicryl and Dermabond and well-padded sterile dressing  and OpSite was placed.  The patient was awakened, extubated, and taken to the recovery room in stable condition.  All final counts were correct and there were no complications noted.     Vanita Panda. Magnus Ivan, M.D.     CYB/MEDQ  D:  03/20/2011  T:  03/21/2011  Job:  [id number]  Electronically Signed by Doneen Poisson M.D. on 03/22/2011 11:10:49 AM

## 2011-05-23 ENCOUNTER — Encounter (INDEPENDENT_AMBULATORY_CARE_PROVIDER_SITE_OTHER): Payer: Self-pay

## 2011-05-23 ENCOUNTER — Ambulatory Visit (INDEPENDENT_AMBULATORY_CARE_PROVIDER_SITE_OTHER): Payer: BC Managed Care – PPO | Admitting: Physician Assistant

## 2011-05-23 VITALS — BP 138/84 | Ht 61.0 in | Wt 212.8 lb

## 2011-05-23 DIAGNOSIS — Z4651 Encounter for fitting and adjustment of gastric lap band: Secondary | ICD-10-CM

## 2011-05-23 NOTE — Patient Instructions (Signed)
Take clear liquids for the next 48 hours. Thin protein shakes are ok to start on Saturday evening. Call us if you have persistent vomiting or regurgitation, night cough or reflux symptoms. Return as scheduled or sooner if you notice no changes in hunger/portion sizes.   

## 2011-05-23 NOTE — Progress Notes (Signed)
  HISTORY: [name] is a 60 y.o.female who received an AP-Standard lap-band in July 2009 by Dr. Johna Sheriff. She was last seen in June 2011 when she complained of GERD type symptoms and we opted to not do an adjustment at that time secondary to perceived over-restriction. She comes in today complaining of increased weight. It is very difficult to obtain clear information about her eating habits but she is insistent on having an adjustment today. She says she often eats fish without difficulty but overall she has relatively little appetite. She will sometimes eat ice cream and mashed potatoes. She has no persistent reflux or regurgitation, fortunately.  VITAL SIGNS: Filed Vitals:   05/23/11 1458  BP: 138/84    PHYSICAL EXAM: Physical exam reveals a very well-appearing 60 y.o.female in no apparent distress Neurologic: Awake, alert, oriented Psych: Bright affect, conversant Respiratory: Breathing even and unlabored. No stridor or wheezing Abdomen: Soft, nontender, nondistended to palpation. Incisions well-healed. No incisional hernias. Port easily palpated. Extremities: Atraumatic, good range of motion.  ASSESMENT: 60 y.o.  female  s/p AP-Standard lap-band. She would like an adjustment today.  PLAN: We discussed at length the likelihood that she's engaging in maladaptive eating behaviors but again, it was difficult to get a consistent history. I don't believe she's completely over-restricted as she's capable of eating solid foods without complaint. As such we decided to do a very small fill in the hopes that she could lose some more weight. I added 0.25 mL to her band after which she was able to swallow water without difficulty at all. Her total fill volume to date is 6.25 mL. She was instructed to take clears over the weekend and to return in 3 months.

## 2011-05-30 ENCOUNTER — Encounter (INDEPENDENT_AMBULATORY_CARE_PROVIDER_SITE_OTHER): Payer: BC Managed Care – PPO

## 2011-06-06 ENCOUNTER — Encounter (INDEPENDENT_AMBULATORY_CARE_PROVIDER_SITE_OTHER): Payer: BC Managed Care – PPO

## 2011-07-24 LAB — CBC
Platelets: 304
RBC: 4.75
WBC: 11.9 — ABNORMAL HIGH

## 2011-07-24 LAB — BASIC METABOLIC PANEL
BUN: 16
Calcium: 9.8
Chloride: 103
Creatinine, Ser: 0.93
GFR calc Af Amer: >60
GFR calc non Af Amer: >60

## 2011-07-25 LAB — CBC
HCT: 40.1
Hemoglobin: 13.1
MCV: 88.2
Platelets: 282
RBC: 4.55
WBC: 19.2 — ABNORMAL HIGH

## 2011-07-25 LAB — DIFFERENTIAL
Eosinophils Absolute: 0
Eosinophils Relative: 0
Lymphocytes Relative: 18
Lymphs Abs: 3.4
Monocytes Absolute: 2 — ABNORMAL HIGH
Monocytes Relative: 10

## 2011-08-22 ENCOUNTER — Encounter (INDEPENDENT_AMBULATORY_CARE_PROVIDER_SITE_OTHER): Payer: BC Managed Care – PPO

## 2011-09-03 ENCOUNTER — Encounter (INDEPENDENT_AMBULATORY_CARE_PROVIDER_SITE_OTHER): Payer: Self-pay | Admitting: Physician Assistant

## 2011-09-05 ENCOUNTER — Telehealth (INDEPENDENT_AMBULATORY_CARE_PROVIDER_SITE_OTHER): Payer: Self-pay | Admitting: General Surgery

## 2011-09-05 NOTE — Telephone Encounter (Signed)
Called pt at home. Confirmed she would be able to come to lap band clinic for 10:15 appt on 09/12/11.

## 2011-09-05 NOTE — Telephone Encounter (Signed)
Called pt mobile to advise of correction needed for 11/16 appt at 1:30. Left vmail advising of 1/2 day schedule.

## 2011-09-10 ENCOUNTER — Encounter (INDEPENDENT_AMBULATORY_CARE_PROVIDER_SITE_OTHER): Payer: Self-pay | Admitting: General Surgery

## 2011-09-12 ENCOUNTER — Encounter (INDEPENDENT_AMBULATORY_CARE_PROVIDER_SITE_OTHER): Payer: BC Managed Care – PPO

## 2011-09-25 ENCOUNTER — Encounter (INDEPENDENT_AMBULATORY_CARE_PROVIDER_SITE_OTHER): Payer: Self-pay | Admitting: Physician Assistant

## 2011-10-10 ENCOUNTER — Encounter (INDEPENDENT_AMBULATORY_CARE_PROVIDER_SITE_OTHER): Payer: Self-pay

## 2011-10-10 ENCOUNTER — Ambulatory Visit (INDEPENDENT_AMBULATORY_CARE_PROVIDER_SITE_OTHER): Payer: BC Managed Care – PPO | Admitting: Physician Assistant

## 2011-10-10 ENCOUNTER — Encounter (INDEPENDENT_AMBULATORY_CARE_PROVIDER_SITE_OTHER): Payer: Self-pay | Admitting: General Surgery

## 2011-10-10 VITALS — BP 138/92 | HR 80 | Temp 98.0°F | Resp 18 | Ht 61.0 in | Wt 196.8 lb

## 2011-10-10 DIAGNOSIS — Z4651 Encounter for fitting and adjustment of gastric lap band: Secondary | ICD-10-CM

## 2011-10-10 NOTE — Patient Instructions (Signed)
Take clear liquids for the next 48 hours. Thin protein shakes are ok to start on Saturday evening. Call us if you have persistent vomiting or regurgitation, night cough or reflux symptoms. Return as scheduled or sooner if you notice no changes in hunger/portion sizes.   

## 2011-10-10 NOTE — Progress Notes (Signed)
  HISTORY: [name] is a 60 y.o.female who received an AP-Standard lap-band in July 2009 by Dr. Johna Sheriff. She says she's getting more hungry than previously. She's having no untoward symptoms.  VITAL SIGNS: Filed Vitals:   10/10/11 1119  BP: 138/92  Pulse: 80  Temp: 98 F (36.7 C)  Resp: 18    PHYSICAL EXAM: Physical exam reveals a very well-appearing 60 y.o.female in no apparent distress Neurologic: Awake, alert, oriented Psych: Bright affect, conversant Respiratory: Breathing even and unlabored. No stridor or wheezing Abdomen: Soft, nontender, nondistended to palpation. Incisions well-healed. No incisional hernias. Port easily palpated. Extremities: Atraumatic, good range of motion.  ASSESMENT: 60 y.o.  female  s/p AP-Standard lap-band.   PLAN: The patient's port was accessed with a 20G Huber needle without difficulty. Clear fluid was aspirated and 0.25 mL saline was added to the port to give a total predicted volume of 6.5 mL. The patient was able to swallow water without difficulty following the procedure and was instructed to take clear liquids for the next 24-48 hours and advance slowly as tolerated.

## 2011-12-11 ENCOUNTER — Encounter (INDEPENDENT_AMBULATORY_CARE_PROVIDER_SITE_OTHER): Payer: Self-pay | Admitting: General Surgery

## 2012-02-12 ENCOUNTER — Other Ambulatory Visit (INDEPENDENT_AMBULATORY_CARE_PROVIDER_SITE_OTHER): Payer: Self-pay

## 2012-02-12 ENCOUNTER — Encounter (INDEPENDENT_AMBULATORY_CARE_PROVIDER_SITE_OTHER): Payer: Self-pay | Admitting: Physician Assistant

## 2012-02-12 ENCOUNTER — Ambulatory Visit (INDEPENDENT_AMBULATORY_CARE_PROVIDER_SITE_OTHER): Payer: BC Managed Care – PPO | Admitting: Physician Assistant

## 2012-02-12 DIAGNOSIS — E669 Obesity, unspecified: Secondary | ICD-10-CM

## 2012-02-12 NOTE — Patient Instructions (Signed)
Return in 3 months or sooner if needed. Follow-up with nutrition to evaluate your diet. Exercise 30 minutes daily.

## 2012-02-12 NOTE — Progress Notes (Signed)
  HISTORY: [name] is a 61 y.o.female who received an AP-Standard lap-band in July 2009 by Dr. Johna Sheriff. She says overall she doesn't get hungry. She eats once a day for the most part and her portions remain small. She denies persistent regurgitation or reflux but says she can't tolerate certain foods. She has not been exercising regularly.  VITAL SIGNS: Filed Vitals:   02/12/12 1113  BP: 130/82    PHYSICAL EXAM: Physical exam reveals a very well-appearing 61 y.o.female in no apparent distress Neurologic: Awake, alert, oriented Psych: Bright affect, conversant Respiratory: Breathing even and unlabored. No stridor or wheezing Extremities: Atraumatic, good range of motion. Skin: Warm, Dry, no rashes Musculoskeletal: Normal gait, Joints normal  ASSESMENT: 60 y.o.  female  s/p AP-Standard lap-band.   PLAN: Her two biggest hurdles to weight loss are lack of physical activity and her diet. She does avoid fried foods but she names potatoes as one of the foods that she "can eat." I advised her to check in with nutrition to evaluate her overall diet then return to see [place] in 3 months. I also asked that she exercise daily at least 30 minutes. She has a treadmill at home.

## 2012-03-16 ENCOUNTER — Ambulatory Visit: Payer: BC Managed Care – PPO | Admitting: *Deleted

## 2012-04-26 ENCOUNTER — Ambulatory Visit: Payer: BC Managed Care – PPO | Admitting: *Deleted

## 2012-05-13 ENCOUNTER — Encounter (INDEPENDENT_AMBULATORY_CARE_PROVIDER_SITE_OTHER): Payer: BC Managed Care – PPO

## 2012-07-19 ENCOUNTER — Inpatient Hospital Stay (HOSPITAL_COMMUNITY)
Admission: EM | Admit: 2012-07-19 | Discharge: 2012-07-22 | DRG: 014 | Disposition: A | Discharge status: Rehabilitation Facility | Payer: BC Managed Care – PPO | Attending: Internal Medicine | Admitting: Internal Medicine

## 2012-07-19 ENCOUNTER — Encounter (HOSPITAL_COMMUNITY): Payer: Self-pay | Admitting: Family Medicine

## 2012-07-19 ENCOUNTER — Emergency Department (HOSPITAL_COMMUNITY): Payer: BC Managed Care – PPO

## 2012-07-19 DIAGNOSIS — M6281 Muscle weakness (generalized): Secondary | ICD-10-CM

## 2012-07-19 DIAGNOSIS — Z8249 Family history of ischemic heart disease and other diseases of the circulatory system: Secondary | ICD-10-CM

## 2012-07-19 DIAGNOSIS — R531 Weakness: Secondary | ICD-10-CM | POA: Diagnosis present

## 2012-07-19 DIAGNOSIS — G8190 Hemiplegia, unspecified affecting unspecified side: Secondary | ICD-10-CM | POA: Diagnosis present

## 2012-07-19 DIAGNOSIS — I639 Cerebral infarction, unspecified: Secondary | ICD-10-CM | POA: Diagnosis present

## 2012-07-19 DIAGNOSIS — R209 Unspecified disturbances of skin sensation: Secondary | ICD-10-CM

## 2012-07-19 DIAGNOSIS — F32A Depression, unspecified: Secondary | ICD-10-CM | POA: Diagnosis present

## 2012-07-19 DIAGNOSIS — F3289 Other specified depressive episodes: Secondary | ICD-10-CM | POA: Diagnosis present

## 2012-07-19 DIAGNOSIS — Z833 Family history of diabetes mellitus: Secondary | ICD-10-CM

## 2012-07-19 DIAGNOSIS — Z8051 Family history of malignant neoplasm of kidney: Secondary | ICD-10-CM

## 2012-07-19 DIAGNOSIS — I6789 Other cerebrovascular disease: Secondary | ICD-10-CM

## 2012-07-19 DIAGNOSIS — E785 Hyperlipidemia, unspecified: Secondary | ICD-10-CM | POA: Diagnosis present

## 2012-07-19 DIAGNOSIS — I6350 Cerebral infarction due to unspecified occlusion or stenosis of unspecified cerebral artery: Principal | ICD-10-CM | POA: Diagnosis present

## 2012-07-19 DIAGNOSIS — E119 Type 2 diabetes mellitus without complications: Secondary | ICD-10-CM | POA: Diagnosis present

## 2012-07-19 DIAGNOSIS — Z823 Family history of stroke: Secondary | ICD-10-CM

## 2012-07-19 DIAGNOSIS — I10 Essential (primary) hypertension: Secondary | ICD-10-CM | POA: Diagnosis present

## 2012-07-19 DIAGNOSIS — Z803 Family history of malignant neoplasm of breast: Secondary | ICD-10-CM

## 2012-07-19 DIAGNOSIS — I2510 Atherosclerotic heart disease of native coronary artery without angina pectoris: Secondary | ICD-10-CM | POA: Diagnosis present

## 2012-07-19 DIAGNOSIS — F329 Major depressive disorder, single episode, unspecified: Secondary | ICD-10-CM | POA: Diagnosis present

## 2012-07-19 DIAGNOSIS — Z8673 Personal history of transient ischemic attack (TIA), and cerebral infarction without residual deficits: Secondary | ICD-10-CM

## 2012-07-19 DIAGNOSIS — Z7902 Long term (current) use of antithrombotics/antiplatelets: Secondary | ICD-10-CM

## 2012-07-19 DIAGNOSIS — Z79899 Other long term (current) drug therapy: Secondary | ICD-10-CM

## 2012-07-19 DIAGNOSIS — Z8261 Family history of arthritis: Secondary | ICD-10-CM

## 2012-07-19 DIAGNOSIS — I252 Old myocardial infarction: Secondary | ICD-10-CM

## 2012-07-19 HISTORY — DX: Malignant neoplasm of unspecified kidney, except renal pelvis: C64.9

## 2012-07-19 LAB — COMPREHENSIVE METABOLIC PANEL
Alkaline Phosphatase: 136 U/L — ABNORMAL HIGH (ref 39–117)
BUN: 13 mg/dL (ref 6–23)
Chloride: 100 mEq/L (ref 96–112)
GFR calc Af Amer: 78 mL/min — ABNORMAL LOW (ref >90)
Glucose, Bld: 137 mg/dL — ABNORMAL HIGH (ref 70–99)
Potassium: 4.5 mEq/L (ref 3.5–5.1)
Total Bilirubin: 0.3 mg/dL (ref 0.3–1.2)

## 2012-07-19 LAB — CBC WITH DIFFERENTIAL/PLATELET
Eosinophils Absolute: 0.3 10*3/uL (ref 0.0–0.7)
HCT: 40.5 % (ref 36.0–46.0)
Hemoglobin: 13 g/dL (ref 12.0–15.0)
Lymphs Abs: 3.4 10*3/uL (ref 0.7–4.0)
MCH: 29.5 pg (ref 26.0–34.0)
Monocytes Relative: 9 % (ref 3–12)
Neutrophils Relative %: 50 % (ref 43–77)
RBC: 4.4 MIL/uL (ref 3.87–5.11)

## 2012-07-19 LAB — PROTIME-INR: Prothrombin Time: 12.7 seconds (ref 11.6–15.2)

## 2012-07-19 LAB — GLUCOSE, CAPILLARY: Glucose-Capillary: 135 mg/dL — ABNORMAL HIGH (ref 70–99)

## 2012-07-19 LAB — TROPONIN I: Troponin I: <0.3 ng/mL (ref <0.30)

## 2012-07-19 MED ORDER — CLOPIDOGREL BISULFATE 75 MG PO TABS
75.0000 mg | ORAL_TABLET | Freq: Every day | ORAL | Status: DC
  Administered 2012-07-20 – 2012-07-21 (×3): 75 mg via ORAL
  Filled 2012-07-19 (×3): qty 1

## 2012-07-19 MED ORDER — ENOXAPARIN SODIUM 40 MG/0.4ML ~~LOC~~ SOLN
40.0000 mg | Freq: Every day | SUBCUTANEOUS | Status: DC
  Administered 2012-07-20 – 2012-07-21 (×3): 40 mg via SUBCUTANEOUS
  Filled 2012-07-19 (×4): qty 0.4

## 2012-07-19 MED ORDER — INSULIN ASPART 100 UNIT/ML ~~LOC~~ SOLN
0.0000 [IU] | Freq: Three times a day (TID) | SUBCUTANEOUS | Status: DC
  Administered 2012-07-20: 8 [IU] via SUBCUTANEOUS
  Administered 2012-07-20 – 2012-07-21 (×2): 3 [IU] via SUBCUTANEOUS
  Administered 2012-07-21 – 2012-07-22 (×3): 2 [IU] via SUBCUTANEOUS
  Administered 2012-07-22: 3 [IU] via SUBCUTANEOUS
  Administered 2012-07-22: 2 [IU] via SUBCUTANEOUS

## 2012-07-19 MED ORDER — VENLAFAXINE HCL ER 150 MG PO CP24
150.0000 mg | ORAL_CAPSULE | Freq: Every day | ORAL | Status: DC
  Administered 2012-07-20 – 2012-07-21 (×3): 150 mg via ORAL
  Filled 2012-07-19 (×4): qty 1

## 2012-07-19 MED ORDER — SODIUM CHLORIDE 0.9 % IV SOLN
INTRAVENOUS | Status: DC
  Administered 2012-07-20: 01:00:00 via INTRAVENOUS

## 2012-07-19 MED ORDER — LOSARTAN POTASSIUM 50 MG PO TABS
100.0000 mg | ORAL_TABLET | Freq: Every day | ORAL | Status: DC
  Administered 2012-07-20 – 2012-07-21 (×2): 100 mg via ORAL
  Filled 2012-07-19 (×4): qty 2

## 2012-07-19 MED ORDER — CITALOPRAM HYDROBROMIDE 20 MG PO TABS
20.0000 mg | ORAL_TABLET | Freq: Every day | ORAL | Status: DC
  Administered 2012-07-20 – 2012-07-21 (×3): 20 mg via ORAL
  Filled 2012-07-19 (×4): qty 1

## 2012-07-19 MED ORDER — ONDANSETRON HCL 4 MG/2ML IJ SOLN: 4.0000 mg | Freq: Four times a day (QID) | INTRAMUSCULAR | Status: DC | PRN

## 2012-07-19 MED ORDER — METOPROLOL TARTRATE 50 MG PO TABS
50.0000 mg | ORAL_TABLET | Freq: Two times a day (BID) | ORAL | Status: DC
  Administered 2012-07-20 – 2012-07-22 (×6): 50 mg via ORAL
  Filled 2012-07-19 (×9): qty 1

## 2012-07-19 MED ORDER — SENNOSIDES-DOCUSATE SODIUM 8.6-50 MG PO TABS
1.0000 | ORAL_TABLET | Freq: Every evening | ORAL | Status: DC | PRN
  Filled 2012-07-19: qty 1

## 2012-07-19 NOTE — ED Notes (Signed)
Patient transported to CT 

## 2012-07-19 NOTE — ED Notes (Signed)
Per pt woke up this am and noticed some left sided weakness. sts unable to walk on left leg and use left arm appropriately. Pt hx of stroke with left sided weakness.

## 2012-07-19 NOTE — ED Notes (Signed)
CBG 135  

## 2012-07-19 NOTE — ED Notes (Signed)
Admitting MD at bedside.

## 2012-07-19 NOTE — ED Notes (Signed)
PIV attempt x 2. Unsuccessful. 2nd RN in to assess.

## 2012-07-19 NOTE — H&P (Signed)
[facility] History and Physical  HIRA TRENT QMV:[id number] DOB: 1951/08/06 DOA: 07/19/2012  Referring physician: Dr. Tanna Savoy PCP: [name], MD   Chief Complaint: left side weakness (concerns for CVA)  HPI: [name] is a 61 y.o. female with pmh of HTN, HLD, DM, hx of previously heart attack and remote pontine CVA; came to ED complaining of left side weakness, LUE numbness and difficulty walking. Patient reports that symptoms started gradually around 10-11am after waking up; reports some associated base neck discomfort. Patient came to ED after more than 6 hours of worsening symptoms (reason why code stroke and TPA has been called). Patient denies CP, SOB, abdominal pain, nausea/vomiting, dysuria, cough or any other complaints. CT in ED negative for acute intracranial abnormalities. [facility] called to admit patient for further evaluation and treatment.  Of note due to prior stroke and also MI, patient has been on plavix and reports to be compliant with medications.  Review of Systems: Negative except as otherwise mentioned on HPI.  Past Medical History  Diagnosis Date  . Heart attack   . Stroke   . Hypertension   . Diabetes mellitus   . Depression   . Sleep apnea   . Hyperlipidemia   . Bronchitis   . SOB (shortness of breath)   . Arthritis   . Dizziness   . Weakness   . Chronic kidney disease     possible stones - confirm with patient  . Sore throat   . Sinus problem   . Family history of breast cancer    Past Surgical History  Procedure Date  . Spleen removal 2001  . Kidney surgery 2001    removal - confirm which with patient, was not on history form  . Laparoscopic gastric banding 05/09/2008  . Abdominal hysterectomy 2002  . Cholecystectomy 1999   Social History:  reports that she has never smoked. She does not have any smokeless tobacco history on file. She reports that she does not drink alcohol or use illicit drugs. Patient from home with no  assistance needed  prior to admission for ADL's  Allergies  Allergen Reactions  . Iohexol      Code: HIVES, Desc: Pt states in 2005 w/ a heart cath, she broke out in large hives and a rash.  She was given 50mg  benadryl, po.  She tolerated the procedure well w/o complication.  Thanks., Onset Date: [id number]     Family History  Problem Relation Age of Onset  . Diabetes Father   . Alzheimer's disease Father   . Kidney cancer Father   . Hypertension Father   . Kidney disease Father     kidney removal  . Hypertension Mother   . Arthritis Mother   . Stroke Brother   . Diabetes Brother   . Hypertension Brother   . Diabetes Sister   . Thyroid disease Sister   . Hyperlipidemia Sister   . Other Sister     thyroidectomy   Prior to Admission medications   Medication Sig Start Date End Date Taking? Authorizing Provider  citalopram (CELEXA) 20 MG tablet Take 20 mg by mouth at bedtime.   Yes Historical Provider, MD  clopidogrel (PLAVIX) 75 MG tablet Take 75 mg by mouth at bedtime.   Yes Historical Provider, MD  losartan (COZAAR) 100 MG tablet Take 100 mg by mouth at bedtime.   Yes Historical Provider, MD  metFORMIN (GLUCOPHAGE) 500 MG tablet Take 500 mg by mouth at bedtime.   Yes Historical  Provider, MD  metoprolol (LOPRESSOR) 50 MG tablet Take 50 mg by mouth 2 (two) times daily.   Yes Historical Provider, MD  traMADol (ULTRAM) 50 MG tablet Take 50 mg by mouth 2 (two) times daily as needed. For pain   Yes Historical Provider, MD  venlafaxine XR (EFFEXOR-XR) 150 MG 24 hr capsule Take 150 mg by mouth at bedtime.   Yes Historical Provider, MD   Physical Exam: Filed Vitals:   07/19/12 1513 07/19/12 1734 07/19/12 1800 07/19/12 1830  BP: 159/97  112/94 141/70  Pulse: 99  76 88  Temp: 97.6 F (36.4 C) 98.6 F (37 C)    TempSrc: Oral     Resp: 18  20 16   SpO2: 97%  100% 97%     General:  NAD  Eyes: PERRLA, no nystagmus, EOMI  ENT: no erythema or exudates inside her mouth, good  dentitions, moist MM  Neck: no bruits  Cardiovascular: regular rate, S1 and S2, no rubs or gallops.  Respiratory: CTA bilaterally  Abdomen: soft, NT, ND, positive BS  Skin: no rash or petechiae  Musculoskeletal: no joint swelling, no joint erythema, decrease range of motion on her Left shoulder, no pain.  Psychiatric: appropriate, no SI  Neurologic: CN grossly intact, AAOX3; patient with 3-5 LUE muscle strength; also with 4/5 on LLE; rest of her limbs with normal strength and coordination; no tongue deviation; no nystagmus.  Labs on Admission:  Basic Metabolic Panel:  Lab 07/19/12 2130  NA 137  K 4.5  CL 100  CO2 29  GLUCOSE 137*  BUN 13  CREATININE 0.91  CALCIUM 10.2  MG --  PHOS --   Liver Function Tests:  Lab 07/19/12 1555  AST 20  ALT 15  ALKPHOS 136*  BILITOT 0.3  PROT 8.6*  ALBUMIN 4.0   CBC:  Lab 07/19/12 1555  WBC 9.5  NEUTROABS 4.8  HGB 13.0  HCT 40.5  MCV 92.0  PLT 341   CBG:  Lab 07/19/12 1541  GLUCAP 135*    Radiological Exams on Admission: Ct Head Wo Contrast  07/19/2012  *RADIOLOGY REPORT*  Clinical Data: 61 year old female left side weakness.  History of stroke.  CT HEAD WITHOUT CONTRAST  Technique:  Contiguous axial images were obtained from the base of the skull through the vertex without contrast.  Comparison: Brain MRI and head CT 12/26/2009.  Findings: Visualized paranasal sinuses and mastoids are clear.  No acute osseous abnormality identified.  Visualized orbits and scalp soft tissues are within normal limits.  Chronic advanced small vessel ischemia.  Chronic lacunar infarct in the left thalamus.  Expected evolution of the right brain stem infarct which was acute at the time of the comparison.  Patchy and confluent cerebral white matter hypodensity.  No midline shift, mass effect, or evidence of mass lesion.  No ventriculomegaly. No acute intracranial hemorrhage identified.  No suspicious intracranial vascular hyperdensity. No  evidence of cortically based acute infarction identified.  IMPRESSION: Chronically advanced small vessel ischemia. No acute intracranial abnormality.   Original Report Authenticated By: Ulla Potash III, M.D.     EKG: no acute ischemic changes; Regular rate  Assessment/Plan 1-Left-sided weakness: affecting LUE more than LLE. At this point with concerns for recurrent CVA vs pinch nerve; patient is reporting base of neck discomfort and mainly LUE extremity numbness along with weakness. Will admit for stroke work up, MRI to be extended to cervical spine; continue plavix and follow neurology recommendations. CT scan of head without acute abnormalities.  2-HTN (  hypertension): essentially stable, especially for acute setting of stroke; mild permissive with hypertensive state. Continue current meds.  3-HLD (hyperlipidemia): check lipid profile, continue statins.  4-Depression: continue celexa and effexor; no SI or hallucinations.  5-Diabetes mellitus: will hold metformin while inside the hospital; start on SSI and follow A1C  6-Hx of MI: no current CP, EKG w/o acute ischemia. Will admit to telemetry; cycle cardiac enzymes.  DVT: lovenox   Code Status: Full Family Communication: plan of care discussed with husband and patient at bedside Disposition Plan: home when medically stable  Time spent: > 30 minutes  Leelynn Whetsel [facility] Pager [phone]  If 7PM-7AM, please contact night-coverage [email or web address] Password [facility] 07/19/2012, 6:57 PM

## 2012-07-19 NOTE — Consult Note (Signed)
Reason for Consult: Left-sided weakness Referring Physician: [place], C.  CC: Left-sided weakness  History is obtained from: Patient, husband  HPI: Erica HENDRICKSEN is an 61 y.o. female with a history of a previous stroke causing left hemiparesis who presents with onset a left hemiparesis that started this morning. She does feel that it got slightly worse later, when she noticed that she was falling. She states that it is similar to her previous stroke symptoms, however she did not have as much difficulty with walking with her previous stroke. She denies any recent illness, cold, burning with urination, other signs of infection.  She takes Plavix at home for stroke prevention.   ROS: An 11 point ROS was performed and is negative except as noted in the HPI.  Past Medical History  Diagnosis Date  . Heart attack   . Stroke   . Hypertension   . Diabetes mellitus   . Depression   . Sleep apnea   . Hyperlipidemia   . Bronchitis   . SOB (shortness of breath)   . Arthritis   . Dizziness   . Weakness   . Chronic kidney disease     possible stones - confirm with patient  . Sore throat   . Sinus problem   . Family history of breast cancer     Family History: Alzheimer's disease, diabetes, hypertension, brother with stroke  Social History: Tob: Negative  Exam: Current vital signs: BP 150/102  Pulse 99  Temp 98.6 F (37 C) (Oral)  Resp 18  SpO2 100% Vital signs in last 24 hours: Temp:  [97.6 F (36.4 C)-98.6 F (37 C)] 98.6 F (37 C) (09/23 1734) Pulse Rate:  [76-99] 99  (09/23 2045) Resp:  [16-29] 18  (09/23 2045) BP: (112-159)/(70-118) 150/102 mmHg (09/23 2045) SpO2:  [97 %-100 %] 100 % (09/23 2045)  General: In bed, no apparent distress CV: Regular rate and rhythm Mental Status: Patient is awake, alert, oriented to person, place, month, year, and situation. Immediate and remote memory are intact. Patient is able to give a clear and coherent history. Speech is  non-dysarthric language is fluent. Cranial Nerves: II: Visual Fields are full. Pupils are equal, round, and reactive to light.  Discs are difficult to visualize. III,IV, VI: EOMI without ptosis or diploplia.  V,VII: Facial sensation and movement are symmetric.  VIII: hearing is intact to voice X: Uvula elevates symmetrically XI: Shoulder shrug is symmetric. XII: tongue is midline without atrophy or fasciculations.  Motor: Tone is normal. Bulk is normal. 5/5 strength was present in right arm and leg in her left arm, she has 3-4/5 strength, and her left leg she has 4/5 strength in hip flexion, otherwise 5 out of 5 Sensory: Sensation is symmetric to light touch and temperature in the arms and legs. Deep Tendon Reflexes: 2+ and symmetric in the biceps and patellae.  Plantars: Toes are downgoing bilaterally.  Cerebellar: FNF and HKS are intact on right, FNF consistent with weakness on left.  Gait: Did not assess due to weakness   I have reviewed labs in epic and the results pertinent to this consultation are: CMP unremarkable CBC unremarkable Coags normal  I have reviewed the images obtained: CT head-old right pontine stroke, no acute findings  Impression: 61 year old female with left hemiparesis in the setting of a previous right pontine CVA. At this time, I suspect that she has had a recurrent CVA. Also possible would be unmasking of her previous deficits do to some infection, though  there are no signs of this currently. She was not given tPA due to delay in arrival.   Recommendations: 1. HgbA1c, fasting lipid panel 2. MRI, MRA  of the brain without contrast 3. PT consult, OT consult, Speech consult 4. Echocardiogram 5. Carotid dopplers 6. Prophylactic therapy-Antiplatelet med: Plavix - dose 75mg  after swallow eval, if fails then rectal ASA 300mg  7. Risk factor modification 8. Telemetry monitoring 9. Frequent neuro checks 10. UA  Ritta Slot, MD [facility] [phone]

## 2012-07-19 NOTE — ED Provider Notes (Signed)
History     CSN: [id number]  Arrival date & time 07/19/12  1500   First MD Initiated Contact with Patient 07/19/12 1640      Chief Complaint  Patient presents with  . Cerebrovascular Accident    (Consider location/radiation/quality/duration/timing/severity/associated sxs/prior treatment) Patient is a 61 y.o. female presenting with weakness. The history is provided by the patient.  Weakness The primary symptoms include paresthesias and focal weakness. Primary symptoms do not include loss of consciousness, altered mental status, loss of sensation, speech change, fever, nausea or vomiting. The symptoms began 12 to 24 hours ago. The episode lasted 10 hours. The symptoms are unchanged. The neurological symptoms are focal. Context: woke up with the left sided weakness and has not gotten better.  The weakness is unchanged. There is weakness in these regions/motions: left shoulder extension, left bicep flexion, left forearm extension, left thigh extension, left thigh flexion, left plantarflexion and left dorsiflexion. There is impairment of the following actions: reaching upwards, holding things, standing up and walking. There is no impairment of the following actions: swallowing, articulating words or sticking tongue out.  Additional symptoms include weakness. Additional symptoms do not include leg pain or loss of balance. Medical issues also include cerebral vascular accident. Workup history includes MRI.    Past Medical History  Diagnosis Date  . Heart attack   . Stroke   . Hypertension   . Diabetes mellitus   . Depression   . Sleep apnea   . Hyperlipidemia   . Bronchitis   . SOB (shortness of breath)   . Arthritis   . Dizziness   . Weakness   . Chronic kidney disease     possible stones - confirm with patient  . Sore throat   . Sinus problem   . Family history of breast cancer     Past Surgical History  Procedure Date  . Spleen removal 2001  . Kidney surgery 2001    removal  - confirm which with patient, was not on history form  . Laparoscopic gastric banding 05/09/2008  . Abdominal hysterectomy 2002  . Cholecystectomy 1999    Family History  Problem Relation Age of Onset  . Diabetes Father   . Alzheimer's disease Father   . Kidney cancer Father   . Hypertension Father   . Kidney disease Father     kidney removal  . Hypertension Mother   . Arthritis Mother   . Stroke Brother   . Diabetes Brother   . Hypertension Brother   . Diabetes Sister   . Thyroid disease Sister   . Hyperlipidemia Sister   . Other Sister     thyroidectomy    History  Substance Use Topics  . Smoking status: Never Smoker   . Smokeless tobacco: Not on file  . Alcohol Use: No    OB History    Grav Para Term Preterm Abortions TAB SAB Ect Mult Living                  Review of Systems  Constitutional: Negative for fever.  Gastrointestinal: Negative for nausea and vomiting.  Neurological: Positive for focal weakness, weakness and paresthesias. Negative for speech change, loss of consciousness and loss of balance.  Psychiatric/Behavioral: Negative for altered mental status.  All other systems reviewed and are negative.    Allergies  Iohexol  Home Medications   Current Outpatient Rx  Name Route Sig Dispense Refill  . CITALOPRAM HYDROBROMIDE 20 MG PO TABS Oral Take 20 mg by  mouth at bedtime.    . CLOPIDOGREL BISULFATE 75 MG PO TABS Oral Take 75 mg by mouth at bedtime.    Marland Kitchen LOSARTAN POTASSIUM 100 MG PO TABS Oral Take 100 mg by mouth at bedtime.    Marland Kitchen METFORMIN HCL 500 MG PO TABS Oral Take 500 mg by mouth at bedtime.    Marland Kitchen METOPROLOL TARTRATE 50 MG PO TABS Oral Take 50 mg by mouth 2 (two) times daily.    . TRAMADOL HCL 50 MG PO TABS Oral Take 50 mg by mouth 2 (two) times daily as needed. For pain    . VENLAFAXINE HCL ER 150 MG PO CP24 Oral Take 150 mg by mouth at bedtime.      BP 159/97  Pulse 99  Temp 97.6 F (36.4 C) (Oral)  Resp 18  SpO2 97%  Physical Exam    Nursing note and vitals reviewed. Constitutional: She is oriented to person, place, and time. She appears well-developed and well-nourished. No distress.  HENT:  Head: Normocephalic and atraumatic.  Right Ear: Tympanic membrane and ear canal normal.  Left Ear: Tympanic membrane and ear canal normal.  Mouth/Throat: Oropharynx is clear and moist.  Eyes: Conjunctivae normal and EOM are normal. Pupils are equal, round, and reactive to light.  Neck: Normal range of motion. Neck supple.  Cardiovascular: Normal rate, regular rhythm and intact distal pulses.   No murmur heard. Pulmonary/Chest: Effort normal and breath sounds normal. No respiratory distress. She has no wheezes. She has no rales.  Abdominal: Soft. She exhibits no distension. There is no tenderness. There is no rebound and no guarding.  Musculoskeletal: Normal range of motion. She exhibits no edema and no tenderness.  Neurological: She is alert and oriented to person, place, and time. She has normal strength. A sensory deficit is present. No cranial nerve deficit. Coordination normal.       4/5 strength in the left upper and lower ext.  Normal finger to nose.  Skin: Skin is warm and dry. No rash noted. No erythema.  Psychiatric: She has a normal mood and affect. Her behavior is normal.    ED Course  Procedures (including critical care time)  Labs Reviewed  GLUCOSE, CAPILLARY - Abnormal; Notable for the following:    Glucose-Capillary 135 (*)     All other components within normal limits  COMPREHENSIVE METABOLIC PANEL - Abnormal; Notable for the following:    Glucose, Bld 137 (*)     Total Protein 8.6 (*)     Alkaline Phosphatase 136 (*)     GFR calc non Af Amer 68 (*)     GFR calc Af Amer 78 (*)     All other components within normal limits  CBC WITH DIFFERENTIAL  PROTIME-INR   Ct Head Wo Contrast  07/19/2012  *RADIOLOGY REPORT*  Clinical Data: 61 year old female left side weakness.  History of stroke.  CT HEAD WITHOUT  CONTRAST  Technique:  Contiguous axial images were obtained from the base of the skull through the vertex without contrast.  Comparison: Brain MRI and head CT 12/26/2009.  Findings: Visualized paranasal sinuses and mastoids are clear.  No acute osseous abnormality identified.  Visualized orbits and scalp soft tissues are within normal limits.  Chronic advanced small vessel ischemia.  Chronic lacunar infarct in the left thalamus.  Expected evolution of the right brain stem infarct which was acute at the time of the comparison.  Patchy and confluent cerebral white matter hypodensity.  No midline shift, mass effect, or  evidence of mass lesion.  No ventriculomegaly. No acute intracranial hemorrhage identified.  No suspicious intracranial vascular hyperdensity. No evidence of cortically based acute infarction identified.  IMPRESSION: Chronically advanced small vessel ischemia. No acute intracranial abnormality.   Original Report Authenticated By: Harley Hallmark, M.D.      No diagnosis found.    MDM   Patient with symptoms concerning for stroke today. She has a history of stroke and heart attack in the past. States she has no ongoing symptoms from her prior stroke that occurred 5-6 years ago.  She woke up this morning with left upper and lower extremity symptoms.  On exam she has left upper extremity and lower extremity weakness and mild decrease in sensation. Lab work is unremarkable. Patient states she still currently taking Plavix.  Head CT pending  Feel patient needs admission for further stroke workup.  Also MRI of head and neck ordered to r/o cervical cord compression.        Gwyneth Sprout, MD 07/19/12 2320

## 2012-07-20 ENCOUNTER — Encounter (HOSPITAL_COMMUNITY): Payer: Self-pay | Admitting: Physical Medicine and Rehabilitation

## 2012-07-20 ENCOUNTER — Inpatient Hospital Stay (HOSPITAL_COMMUNITY): Payer: BC Managed Care – PPO

## 2012-07-20 DIAGNOSIS — E119 Type 2 diabetes mellitus without complications: Secondary | ICD-10-CM

## 2012-07-20 DIAGNOSIS — I10 Essential (primary) hypertension: Secondary | ICD-10-CM

## 2012-07-20 DIAGNOSIS — E785 Hyperlipidemia, unspecified: Secondary | ICD-10-CM

## 2012-07-20 DIAGNOSIS — F329 Major depressive disorder, single episode, unspecified: Secondary | ICD-10-CM

## 2012-07-20 DIAGNOSIS — I6789 Other cerebrovascular disease: Secondary | ICD-10-CM

## 2012-07-20 DIAGNOSIS — M6281 Muscle weakness (generalized): Secondary | ICD-10-CM

## 2012-07-20 LAB — TROPONIN I
Troponin I: <0.3 ng/mL (ref <0.30)
Troponin I: <0.3 ng/mL (ref <0.30)

## 2012-07-20 LAB — LIPID PANEL
HDL: 57 mg/dL (ref >39)
LDL Cholesterol: 151 mg/dL — ABNORMAL HIGH (ref 0–99)
Total CHOL/HDL Ratio: 4 RATIO
VLDL: 22 mg/dL (ref 0–40)

## 2012-07-20 LAB — HEMOGLOBIN A1C: Hgb A1c MFr Bld: 6.9 % — ABNORMAL HIGH (ref <5.7)

## 2012-07-20 LAB — GLUCOSE, CAPILLARY
Glucose-Capillary: 251 mg/dL — ABNORMAL HIGH (ref 70–99)
Glucose-Capillary: 149 mg/dL — ABNORMAL HIGH (ref 70–99)

## 2012-07-20 MED ORDER — MORPHINE SULFATE 2 MG/ML IJ SOLN: 2.0000 mg | Freq: Four times a day (QID) | INTRAMUSCULAR | Status: DC | PRN

## 2012-07-20 MED ORDER — GABAPENTIN 100 MG PO CAPS
100.0000 mg | ORAL_CAPSULE | Freq: Three times a day (TID) | ORAL | Status: DC
  Administered 2012-07-20 – 2012-07-22 (×7): 100 mg via ORAL
  Filled 2012-07-20 (×9): qty 1

## 2012-07-20 MED ORDER — ATORVASTATIN CALCIUM 10 MG PO TABS
10.0000 mg | ORAL_TABLET | Freq: Every day | ORAL | Status: DC
  Filled 2012-07-20: qty 1

## 2012-07-20 MED ORDER — STROKE: EARLY STAGES OF RECOVERY BOOK
Freq: Once | Status: AC
  Administered 2012-07-20: 17:00:00
  Filled 2012-07-20: qty 1

## 2012-07-20 MED ORDER — INFLUENZA VIRUS VACC SPLIT PF IM SUSP
0.5000 mL | INTRAMUSCULAR | Status: AC
  Administered 2012-07-21: 0.5 mL via INTRAMUSCULAR
  Filled 2012-07-20: qty 0.5

## 2012-07-20 MED ORDER — TRAMADOL HCL 50 MG PO TABS: 50.0000 mg | ORAL_TABLET | Freq: Four times a day (QID) | ORAL | Status: DC | PRN

## 2012-07-20 MED ORDER — ATORVASTATIN CALCIUM 20 MG PO TABS
20.0000 mg | ORAL_TABLET | Freq: Every day | ORAL | Status: DC
  Administered 2012-07-20 – 2012-07-22 (×3): 20 mg via ORAL
  Filled 2012-07-20 (×3): qty 1

## 2012-07-20 NOTE — Progress Notes (Signed)
Utilization review completed. Keyandre Pileggi, RN, BSN. 

## 2012-07-20 NOTE — Evaluation (Addendum)
Physical Therapy Evaluation Patient Details Name: [name] MRN: [id number] DOB: [birth year] Today's Date: 07/20/2012 Time: 7253-6644 PT Time Calculation (min): 50 min  PT Assessment / Plan / Recommendation Clinical Impression  Pt admitted with left sided weakness with prior R pontine infarct and CT negative awaiting MRI. Dr. Gwenlyn Perking present at initiation of session and encouraged pt for mobility with P.T. Pt able to ambulate with therapist min assist to bathroom to void with door partially closed for pt privacy. Pt instructed to tell P.T. when finished voiding as P.T. in room to assist with mobility due to decreased strength but pt attempted standing on her own from [facility] over toilet to perform pericare and as P.T. opened door to assist, pt with LOB and landed on left buttock on ground. Pt with report of no injury and assisted via maximove with P.T. and Rn  to return pt from bathroom to bed. Pt demonstrating decreased strength of LUE and LLE, decreased balance, safety awareness and  [name] benefit from acute therapy to maximize strength, function, balance and mobility prior to discharge to decrease burden of care and maximize safety.     PT Assessment  Patient needs continued PT services    Follow Up Recommendations  Inpatient Rehab;Supervision for mobility/OOB    Barriers to Discharge Decreased caregiver support      Equipment Recommendations  Other (comment) (to be assessed based on pt progress)    Recommendations for Other Services Rehab consult   Frequency Min 4X/week    Precautions / Restrictions Precautions Precautions: Fall Precaution Comments: left weakness   Pertinent Vitals/Pain 3/10 left shoulder soreness throughout without change and RN aware BP 174/74 supine with HR 72 prior to activity BP 182/104 prior to lift HR 66 BP 158/95 HR 63 end of session supine in bed      Mobility  Bed Mobility Bed Mobility: Rolling Right;Rolling Left;Left Sidelying to Sit;Sitting -  Scoot to [place] of Bed;Scooting to [facility] Rolling Right: 3: Mod assist Rolling Left: 3: Mod assist;With rail Left Sidelying to Sit: 3: Mod assist;HOB flat;With rails Sitting - Scoot to Edge of Bed: 4: Min assist Scooting to [facility]: 1: +2 Total assist Scooting to [facility]: Patient Percentage: 30% Details for Bed Mobility Assistance: cueing for sequence, technique and safety with transfers. Assist to elevate trunk and pivot hips to EOB with use of pad and reciprocal scooting Transfers Transfers: Sit to Stand;Stand to Sit Sit to Stand: 4: Min assist;From bed Stand to Sit: 4: Min assist;To bed;To toilet Details for Transfer Assistance: Pt stood x 2 trials from bed with left knee blocked and cueing for sequence with min assist of anterior translation and steadying once standing Ambulation/Gait Ambulation/Gait Assistance: 4: Min assist Ambulation Distance (Feet): 15 Feet Assistive device: 1 person hand held assist Ambulation/Gait Assistance Details: Pt ambulated to bathroom from bed with one person hand held assist with wide based gait and slightly unsteady gait Gait Pattern: Step-to pattern;Wide base of support;Trunk flexed Gait velocity: decreased Stairs: No    Exercises     PT Diagnosis: Abnormality of gait;Acute pain;Hemiplegia non-dominant side  PT Problem List: Decreased strength;Decreased range of motion;Decreased knowledge of use of DME;Decreased activity tolerance;Decreased balance;Decreased mobility;Decreased knowledge of precautions;Decreased safety awareness;Pain PT Treatment Interventions: Gait training;DME instruction;Functional mobility training;Therapeutic activities;Therapeutic exercise;Balance training;Patient/family education   PT Goals Acute Rehab PT Goals PT Goal Formulation: With patient Time For Goal Achievement: 08/03/12 Potential to Achieve Goals: Fair Pt [name] go Supine/Side to Sit: with supervision;with HOB 0 degrees PT Goal: Supine/Side  to Sit - Progress: Goal set today Pt  [name] go Sit to Supine/Side: with min assist;with HOB 0 degrees PT Goal: Sit to Supine/Side - Progress: Goal set today Pt [name] go Sit to Stand: with supervision PT Goal: Sit to Stand - Progress: Goal set today Pt [name] go Stand to Sit: with supervision PT Goal: Stand to Sit - Progress: Goal set today Pt [name] Ambulate: 51 - 150 feet;with least restrictive assistive device;with supervision PT Goal: Ambulate - Progress: Goal set today Pt [name] Go Up / Down Stairs: 6-9 stairs;with min assist;with rail(s) PT Goal: Up/Down Stairs - Progress: Goal set today  Visit Information  Last PT Received On: 07/20/12 Assistance Needed: +2 (safety)    Subjective Data  Subjective: I forgot that I wasn't as strong as usual Patient Stated Goal: be able to return home   Prior Functioning  Home Living Lives With: Spouse Available Help at Discharge: Family;Available PRN/intermittently Type of Home: House Home Access: Ramped entrance Home Layout: Two level;Bed/bath upstairs Alternate Level Stairs-Number of Steps: 13 Alternate Level Stairs-Rails: Right Bathroom Shower/Tub: Walk-in shower;Door [employer] Toilet: Standard Home Adaptive Equipment: Bedside commode/3-in-1;Walker - rolling;Straight cane;Other (comment) (chair lift and hoyer lift) Additional Comments: Pt dgtr has cerebral palsy and equipment left from dgtr who currently lives out of the home.  Prior Function Level of Independence: Independent Able to Take Stairs?: Yes Driving: Yes Communication Communication: No difficulties    Cognition  Overall Cognitive Status: Impaired Area of Impairment: Safety/judgement Arousal/Alertness: Awake/alert Orientation Level: Appears intact for tasks assessed Behavior During Session: [facility] for tasks performed Safety/Judgement: Decreased safety judgement for tasks assessed;Decreased awareness of need for assistance Safety/Judgement - Other Comments: Pt educated for need for assist with mobility and still  attempting standing without assist    Extremity/Trunk Assessment Right Upper Extremity Assessment RUE ROM/Strength/Tone: Within functional levels Left Upper Extremity Assessment LUE ROM/Strength/Tone: Deficits LUE ROM/Strength/Tone Deficits: grossly 2+/5 elbow flexion and extension, 1/5 shoulder flexion LUE Sensation: WFL - Light Touch LUE Coordination: Deficits LUE Coordination Deficits: pt unable to grip on LUE or perform finger to thumb with any digit Right Lower Extremity Assessment RLE ROM/Strength/Tone: Within functional levels RLE Sensation: WFL - Light Touch RLE Coordination: WFL - gross/fine motor Left Lower Extremity Assessment LLE ROM/Strength/Tone: Deficits LLE ROM/Strength/Tone Deficits: hip flexion 3+/5, knee flexion 3-/5, hip abduction/ADD 2+/5 LLE Sensation: WFL - Light Touch Trunk Assessment Trunk Assessment: Other exceptions Trunk Exceptions: pt with decreaed neck ROM due to pain left shoulder   Balance Static Sitting Balance Static Sitting - Balance Support: Feet supported;Right upper extremity supported Static Sitting - Level of Assistance: 5: Stand by assistance Static Sitting - Comment/# of Minutes: 3 Static Standing Balance Static Standing - Balance Support: Left upper extremity supported Static Standing - Level of Assistance: 4: Min assist Static Standing - Comment/# of Minutes: 1  End of Session PT - End of Session Activity Tolerance: Other (comment) (treatment limited secondary to fall ) Patient left: in bed;with call bell/phone within reach;with bed alarm set Nurse Communication: Mobility status  GP     Delorse Lek 07/20/2012, 9:59 AM  Delaney Meigs, PT [phone]

## 2012-07-20 NOTE — Progress Notes (Signed)
Pt fell with PT today; per PT and patient, she landed on her buttocks; pt does not c/o pain; MD paged and made aware, also made aware that MRI results were available

## 2012-07-20 NOTE — Evaluation (Signed)
Speech Language Pathology Evaluation Patient Details Name: LAQUETA BONAVENTURA MRN: [id number] DOB: 09-Apr-1951 Today's Date: 07/20/2012 Time: 0865-7846 SLP Time Calculation (min): 29 min  Problem List:  Patient Active Problem List  Diagnosis  . AMI  . ALLERGIC RHINITIS  . BRONCHITIS  . HYPERSOMNIA, ASSOCIATED WITH SLEEP APNEA  . MUSCULOSKELETAL PAIN  . NEPHRECTOMY, HX OF  . Other Acquired Absence of Organ  . Left-sided weakness  . HTN (hypertension)  . HLD (hyperlipidemia)  . Depression  . Diabetes mellitus   Past Medical History:  Past Medical History  Diagnosis Date  . Heart attack   . Stroke   . Hypertension   . Diabetes mellitus   . Depression   . Sleep apnea   . Hyperlipidemia   . Bronchitis   . SOB (shortness of breath)   . Arthritis   . Dizziness   . Weakness   . Chronic kidney disease     possible stones - confirm with patient  . Sore throat   . Sinus problem   . Family history of breast cancer    Past Surgical History:  Past Surgical History  Procedure Date  . Spleen removal 2001  . Kidney surgery 2001    removal - confirm which with patient, was not on history form  . Laparoscopic gastric banding 05/09/2008  . Abdominal hysterectomy 2002  . Cholecystectomy 1999   HPI:   61 year old female with left hemiparesis in the setting of a previous right pontine CVA. Pt reports baseline memory deficits after prior CVA.    Assessment / Plan / Recommendation Clinical Impression  Pt reports decline in memory several weeks prior to this admission, with family commenting on her changes in recall.  Pt presents today with deficits in complex verbal recall, anticipatory awareness, and complex verbal problem-solving.  Speech is intelligible/not dysarthric.  Pt expresses belief that cognitive function has declined.  May benefit from trial SLP intervention per her request to address the aforementioned deficits. Has passed RN stroke swallow screen.        SLP Assessment  All further Speech Language Pathology  needs can be addressed in the next venue of care    Follow Up Recommendations  Inpatient Rehab    Analycia Khokhar L. Samson Frederic, [place] CCC/SLP Pager [phone]  Blenda Mounts Laurice 07/20/2012, 10:41 AM

## 2012-07-20 NOTE — Consult Note (Signed)
Physical Medicine and Rehabilitation Consult  Reason for Consult: Left sided weakness with numbness Referring Physician:  Dr. Gwenlyn Perking   HPI: [name] is a 61 y.o. 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 CT head negative for acute changes. Neurology consulted and recommended full workup for recurrent stroke v/s unmasking of piror deficits due to infection. 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  PT/ST evaluations done today. Patient with decline in memory over past several weeks as well as balance deficits with unsteady gait. MD, therapy team recommending CIR.   Review of Systems  HENT: Negative for hearing loss.   Eyes: Negative for blurred vision and double vision.  Respiratory: Negative for cough and shortness of breath.   Cardiovascular: Negative for chest pain and palpitations.  Gastrointestinal: Negative for heartburn and abdominal pain.  Genitourinary: Negative for urgency and frequency.  Musculoskeletal: Positive for joint pain (left greater than right hip pain. ).       Left shoulder pain--falls X3 prior to admission.   Neurological: Positive for focal weakness and weakness. Negative for headaches.  Psychiatric/Behavioral: Positive for memory loss (for a  past few months. ).   Past Medical History  Diagnosis Date  . Heart attack   . Stroke   . Hypertension   . Diabetes mellitus   . Depression   . Sleep apnea   . Hyperlipidemia   . Bronchitis   . SOB (shortness of breath)   . Arthritis   . Dizziness   . Weakness   . Chronic kidney disease     possible stones - confirm with patient  . Sore throat   . Sinus problem   . Family history of breast cancer   . Renal cancer    Past Surgical History    Procedure Date  . Spleen removal 2001  . Kidney surgery 2001    removal - due to cancer  . Laparoscopic gastric banding 05/09/2008  . Abdominal hysterectomy 2002  . Cholecystectomy 1999   Family History  Problem Relation Age of Onset  . Diabetes Father   . Alzheimer's disease Father   . Kidney cancer Father   . Hypertension Father   . Kidney disease Father     kidney removal  . Hypertension Mother   . Arthritis Mother   . Stroke Brother   . Diabetes Brother   . Hypertension Brother   . Diabetes Sister   . Thyroid disease Sister   . Hyperlipidemia Sister   . Other Sister     thyroidectomy   Social History:  Married.  Disabled due to multiple medical issues. She reports that she has never smoked. She does not have any smokeless tobacco history on file. She reports that she does not use illicit drugs. Uses alcohol on socially-1/24months. Sedentary PTA- "spends a lot of time in bed" per husband.   Allergies  Allergen Reactions  . Iohexol      Code: HIVES, Desc: Pt states in 2005 w/ a heart cath, she broke out in large hives and a rash.  She was given 50mg  benadryl, po.  She tolerated the procedure well w/o complication.  Thanks., Onset Date: [id number]    Medications Prior to Admission  Medication Sig Dispense Refill  . citalopram (CELEXA) 20 MG tablet Take 20 mg by mouth  at bedtime.      . clopidogrel (PLAVIX) 75 MG tablet Take 75 mg by mouth at bedtime.      Marland Kitchen losartan (COZAAR) 100 MG tablet Take 100 mg by mouth at bedtime.      . metFORMIN (GLUCOPHAGE) 500 MG tablet Take 500 mg by mouth at bedtime.      . metoprolol (LOPRESSOR) 50 MG tablet Take 50 mg by mouth 2 (two) times daily.      . traMADol (ULTRAM) 50 MG tablet Take 50 mg by mouth 2 (two) times daily as needed. For pain      . venlafaxine XR (EFFEXOR-XR) 150 MG 24 hr capsule Take 150 mg by mouth at bedtime.        Home: Home Living Lives With: Spouse Available Help at Discharge: Family;Available  PRN/intermittently Type of Home: House Home Access: Ramped entrance Home Layout: Two level;Bed/bath upstairs Alternate Level Stairs-Number of Steps: 13 Alternate Level Stairs-Rails: Right Bathroom Shower/Tub: Walk-in shower;Door [employer] Toilet: Standard Home Adaptive Equipment: Bedside commode/3-in-1;Walker - rolling;Straight cane;Other (comment) (chair lift and hoyer lift) Additional Comments: Pt dgtr has cerebral palsy and equipment left from dgtr who currently lives out of the home.   Functional History: Prior Function Able to Take Stairs?: Yes Driving: Yes Vocation: On disability Functional Status:  Mobility: Bed Mobility Bed Mobility: Rolling Right;Rolling Left;Left Sidelying to Sit;Sitting - Scoot to [place] of Bed;Scooting to [facility] Rolling Right: 3: Mod assist Rolling Left: 3: Mod assist;With rail Left Sidelying to Sit: 3: Mod assist;HOB flat;With rails Sitting - Scoot to Edge of Bed: 4: Min assist Scooting to [facility]: 1: +2 Total assist Scooting to [facility]: Patient Percentage: 30% Transfers Transfers: Sit to Stand;Stand to Sit Sit to Stand: 4: Min assist;From bed Stand to Sit: 4: Min assist;To bed;To toilet Ambulation/Gait Ambulation/Gait Assistance: 4: Min assist Ambulation Distance (Feet): 15 Feet Assistive device: 1 person hand held assist Ambulation/Gait Assistance Details: Pt ambulated to bathroom from bed with one person hand held assist with wide based gait and slightly unsteady gait Gait Pattern: Step-to pattern;Wide base of support;Trunk flexed Gait velocity: decreased Stairs: No    ADL:    Cognition: Cognition Overall Cognitive Status: Impaired Arousal/Alertness: Awake/alert Orientation Level: Oriented X4 Attention: Selective Memory: Impaired Memory Impairment: Other (comment) (baseline deficits) Awareness: Impaired Awareness Impairment: Anticipatory impairment Problem Solving: Impaired Problem Solving Impairment: Verbal complex Safety/Judgment:  Impaired Cognition Overall Cognitive Status: Impaired Area of Impairment: Safety/judgement Arousal/Alertness: Awake/alert Orientation Level: Appears intact for tasks assessed Behavior During Session: WFL for tasks performed Safety/Judgement: Decreased safety judgement for tasks assessed;Decreased awareness of need for assistance Safety/Judgement - Other Comments: Pt educated for need for assist with mobility and still attempting standing without assist  Blood pressure 158/95, pulse 64, temperature 98.4 F (36.9 C), temperature source Oral, resp. rate 18, height 5\' 1"  (1.549 m), weight 89.359 kg (197 lb), SpO2 100.00%. Physical Exam  Nursing note and vitals reviewed. Constitutional: She is oriented to person, place, and time. She appears well-developed and well-nourished.  HENT:  Head: Normocephalic and atraumatic.  Eyes: Pupils are equal, round, and reactive to light.  Neck: Normal range of motion.  Cardiovascular: Normal rate and regular rhythm.   Pulmonary/Chest: Effort normal and breath sounds normal.  Abdominal: Soft. Bowel sounds are normal.  Musculoskeletal: She exhibits tenderness (left shoulder with ROM).  Neurological: She is alert and oriented to person, place, and time.       Distracted and needs redirection. Impaired insight.  Left upper exttremity is 1-2/5. LLE is  3-4/5. Mild sensory defiicts along left arm more than leg. Mild left central 7 and tongue deviation.  Skin: Skin is warm and dry.  Psychiatric: She has a normal mood and affect. Her behavior is normal. Judgment and thought content normal.    HEMOGLOBIN A1C     Status: Abnormal   Collection Time   07/19/12 10:20 PM      Component Value Range   Hemoglobin A1C 6.9 (*) <5.7 %   Mean Plasma Glucose 151 (*) <117 mg/dL  TROPONIN I     Status: Normal   Collection Time   07/19/12 10:20 PM      Component Value Range   Troponin I <0.30  <0.30 ng/mL  VITAMIN B12     Status: Normal   Collection Time   07/19/12 10:20  PM      Component Value Range   Vitamin B-12 635  211 - 911 pg/mL  TSH     Status: Normal   Collection Time   07/19/12 10:20 PM      Component Value Range   TSH 0.930  0.350 - 4.500 uIU/mL  TROPONIN I     Status: Normal   Collection Time   07/20/12  5:32 AM      Component Value Range   Troponin I <0.30  <0.30 ng/mL  LIPID PANEL     Status: Abnormal   Collection Time   07/20/12  5:35 AM      Component Value Range   Cholesterol 230 (*) 0 - 200 mg/dL   Triglycerides 161  <096 mg/dL   HDL 57  >04 mg/dL   Total CHOL/HDL Ratio 4.0     VLDL 22  0 - 40 mg/dL   LDL Cholesterol 540 (*) 0 - 99 mg/dL  GLUCOSE, CAPILLARY     Status: Abnormal   Collection Time   07/20/12  7:46 AM      Component Value Range   Glucose-Capillary 119 (*) 70 - 99 mg/dL  GLUCOSE, CAPILLARY     Status: Abnormal   Collection Time   07/20/12  1:12 PM      Component Value Range   Glucose-Capillary 166 (*) 70 - 99 mg/dL     Assessment/Plan: Diagnosis: right frontal infarct with left upper extremity weakness 1. Does the need for close, 24 hr/day medical supervision in concert with the patient's rehab needs make it unreasonable for this patient to be served in a less intensive setting? Yes 2. Co-Morbidities requiring supervision/potential complications: htn, depression,  3. Due to bladder management, bowel management, safety, skin/wound care, disease management, medication administration, pain management and patient education, does the patient require 24 hr/day rehab nursing? Yes 4. Does the patient require coordinated care of a physician, rehab nurse, PT (1-2 hrs/day, 5 days/week) and OT (1-2 hrs/day, 5 days/week) to address physical and functional deficits in the context of the above medical diagnosis(es)? Yes Addressing deficits in the following areas: balance, endurance, locomotion, strength, transferring, bowel/bladder control, bathing, dressing, feeding, grooming, toileting and psychosocial support 5. Can the  patient actively participate in an intensive therapy program of at least 3 hrs of therapy per day at least 5 days per week? Yes 6. The potential for patient to make measurable gains while on inpatient rehab is excellent 7. Anticipated functional outcomes upon discharge from inpatient rehab are mod I with PT, mod I with OT, n/a with SLP. 8. Estimated rehab length of stay to reach the above functional goals is: 7-12 days 9. Does the patient  have adequate social supports to accommodate these discharge functional goals? Yes 10. Anticipated D/C setting: Home 11. Anticipated post D/C treatments: HH therapy 12. Overall Rehab/Functional Prognosis: excellent  RECOMMENDATIONS: This patient's condition is appropriate for continued rehabilitative care in the following setting: CIR Patient has agreed to participate in recommended program. Yes Note that insurance prior authorization may be required for reimbursement for recommended care.  Comment:Rehab RN to follow up. Bed availability remains a potential issue.   Ivory Broad, MD     07/20/2012

## 2012-07-20 NOTE — Progress Notes (Signed)
Stroke Team Progress Note  HISTORY [name] is an 61 y.o. female with a history of a previous stroke causing left hemiparesis who presents with onset a left hemiparesis that started this morning 07/19/2012. She does feel that it got slightly worse later, when she noticed that she was falling. She states that it is similar to her previous stroke symptoms, however she did not have as much difficulty with walking with her previous stroke. She denies any recent illness, cold, burning with urination, other signs of infection. She takes Plavix at home for stroke prevention. Patient was not a TPA candidate secondary to delay in arrival. She was admitted  for further evaluation and treatment.  SUBJECTIVE No family is at the bedside.  Overall she feels her condition is gradually improving.   OBJECTIVE Most recent Vital Signs: Filed Vitals:   07/20/12 0200 07/20/12 0400 07/20/12 0800 07/20/12 0938  BP: 178/89 166/92 171/74 158/95  Pulse: 68 64 64 63  Temp:      TempSrc:      Resp:      Height:      Weight:      SpO2: 100%      CBG (last 3)   Basename 07/20/12 0746 07/19/12 2143 07/19/12 1541  GLUCAP 119* 177* 135*   Intake/Output from previous day:    IV Fluid Intake:     . sodium chloride 50 mL/hr at 07/20/12 0038    MEDICATIONS    . citalopram  20 mg Oral QHS  . clopidogrel  75 mg Oral QHS  . enoxaparin  40 mg Subcutaneous QHS  . influenza  inactive virus vaccine  0.5 mL Intramuscular Tomorrow-1000  . insulin aspart  0-15 Units Subcutaneous TID WC  . losartan  100 mg Oral QHS  . metoprolol  50 mg Oral BID  . venlafaxine XR  150 mg Oral QHS   PRN:  ondansetron (ZOFRAN) IV, senna-docusate  Diet:  Carb Control thin liquids Activity:  Bedrest and Up with assistance DVT Prophylaxis:  Lovenox 40 mg sq daily   CLINICALLY SIGNIFICANT STUDIES Basic Metabolic Panel:  Lab 07/19/12 4098  NA 137  K 4.5  CL 100  CO2 29  GLUCOSE 137*  BUN 13  CREATININE 0.91  CALCIUM  10.2  MG --  PHOS --   Liver Function Tests:  Lab 07/19/12 1555  AST 20  ALT 15  ALKPHOS 136*  BILITOT 0.3  PROT 8.6*  ALBUMIN 4.0   CBC:  Lab 07/19/12 1555  WBC 9.5  NEUTROABS 4.8  HGB 13.0  HCT 40.5  MCV 92.0  PLT 341   Coagulation:  Lab 07/19/12 1555  LABPROT 12.7  INR 0.96   Cardiac Enzymes:  Lab 07/20/12 0532 07/19/12 2220  CKTOTAL -- --  CKMB -- --  CKMBINDEX -- --  TROPONINI <0.30 <0.30   Urinalysis: No results found for this basename: COLORURINE:2,APPERANCEUR:2,LABSPEC:2,PHURINE:2,GLUCOSEU:2,HGBUR:2,BILIRUBINUR:2,KETONESUR:2,PROTEINUR:2,UROBILINOGEN:2,NITRITE:2,LEUKOCYTESUR:2 in the last 168 hours Lipid Panel    Component Value Date/Time   CHOL 230* 07/20/2012 0535   TRIG 108 07/20/2012 0535   HDL 57 07/20/2012 0535   CHOLHDL 4.0 07/20/2012 0535   VLDL 22 07/20/2012 0535   LDLCALC 151* 07/20/2012 0535   HgbA1C  Lab Results  Component Value Date   HGBA1C  Value: 7.8 (NOTE) The ADA recommends the following therapeutic goal for glycemic control related to Hgb A1c measurement: Goal of therapy: <6.5 Hgb A1c  Reference: American Diabetes Association: Clinical Practice Recommendations 2010, Diabetes Care, 2010, 33: (Suppl  1).* 12/26/2009  Urine Drug Screen:   No results found for this basename: labopia, cocainscrnur, labbenz, amphetmu, thcu, labbarb    Alcohol Level: No results found for this basename: ETH:2 in the last 168 hours  CT of the brain  07/19/2012 Chronically advanced small vessel ischemia. No acute intracranial abnormality  MRI of the brain    MRA of the brain    2D Echocardiogram    Carotid Doppler    CXR    EKG  normal sinus rhythm.   Therapy Recommendations PT - ; OT - ; ST -   Physical Exam   Pleasant young African American lady currently not in distress.Awake alert. Afebrile. Head is nontraumatic. Neck is supple without bruit. Hearing is normal. Cardiac exam no murmur or gallop. Lungs are clear to auscultation. Distal pulses are  well felt.  Neurological Exam : Awake alert oriented x 3 normal speech and language.fundi not visualized. Vision acuity and fields appear normal. Extraocular moments are full range without nystagmus. Mild left lower face asymmetry. Tongue midline. No drift. Mild diminished fine finger movements on left. Orbits right over left upper extremity. Mild left grip weak.. Normal sensation . Normal coordination. Plantars both downgoing. Gait was not tested. ASSESSMENT [name] is a 61 y.o. female presenting with left hemiparesis. Imaging pending; Work up underway. On clopidogrel 75 mg orally every day prior to admission. Now on clopidogrel 75 mg orally every day for secondary stroke prevention. Patient with resultant left hemiparesis that is improving.   CAD - MI  Stroke Hypertension Diabetes, HgbA1c pending Sleep apnea Hyperlipidemia, LDL 151, not on statin   Hospital day # 1  TREATMENT/PLAN  Continue clopidogrel 75 mg orally every day for secondary stroke prevention.  F/u MRI, MRA, 2D, carotid doppler Consider ACCELERATE trial, use of Cholesteryl Ester Transfer Protein (CETP) Inhibitor: Potential of Evacetrapib to treat atherosclerosis and CAD. [facility] will contact patient with information about trial, screen for inclusion. Statin added  Annie Main, MSN, RN, ANVP-BC, ANP-BC, GNP-BC [facility] Pager: [phone] 07/20/2012 10:01 AM  Scribe for Dr. Delia Heady, Stroke Center Medical Director, who has personally reviewed chart, pertinent data, examined the patient and developed the plan of care. Pager:  [phone]

## 2012-07-20 NOTE — Progress Notes (Signed)
[facility] PROGRESS NOTE  [name]:[id number] DOB: 1950/12/17 DOA: 07/19/2012 PCP: [name], MD  Assessment/Plan: 1-Left-sided weakness: affecting LUE more than LLE; with associated left shoulder pain and left arm numbness. Will follow MRI and finish stroke workup. Will continue plavix for now as secondary prevention and follow neurology recommendations. Patient has also been started on neurontin for left shoulder discomfort. Continue Pt/OT/SLP  2-HTN (hypertension): essentially stable, especially for acute setting of stroke; will be permissive with hypertensive state. Continue current meds. Pain on her shoulder might be contributing to elevated BP as well. Further adjustment to be done depending on clinical evolution.  3-HLD (hyperlipidemia): LDL 151. Will increase lipitor to 20; patient advise to follow low fatd diet.  4-Depression: continue celexa and effexor; no SI or hallucinations.   5-Diabetes mellitus: will continue holding metformin while inside the hospital; Continue SSI; A1C 6.9 (good control)  6-Hx of MI: no current CP, EKG and telemetry w/o acute ischemia. Will cardiac enzymes negative so far.   DVT: lovenox   Code Status: Full Family Communication: no family at bedside this morning Disposition Plan: Per PT patient will benefit of CIR prior going back home. Order has been placed   Brief narrative: Erica y.o. [name] with pmh of HTN, HLD, DM, hx of previously heart attack and remote pontine CVA; came to ED complaining of left side weakness, LUE numbness and difficulty walking. Patient reports that symptoms started gradually around 10-11am after waking up; reports some associated base neck discomfort. Patient came to ED after more than 6 hours of worsening symptoms (reason why code stroke and TPA has been called). Patient denies CP, SOB, abdominal pain, nausea/vomiting, dysuria, cough or any other complaints. CT in ED negative for acute intracranial  abnormalities.   Consultants:  Neuro  Procedures:  CT head (no acute abnormalities)  MRI brain pending  2-D echo pending  Antibiotics:  none  HPI/Subjective: Afebrile; still with LUE and LLE weakness; also with left shoulder pain and numbness. MRI pending.  Objective: Filed Vitals:   07/20/12 0400 07/20/12 0800 07/20/12 0938 07/20/12 1020  BP: 166/92 171/74 158/95 158/95  Pulse: 64 64 63 64  Temp:      TempSrc:      Resp:      Height:      Weight:      SpO2:       No intake or output data in the 24 hours ending 07/20/12 1034 Filed Weights   07/19/12 2145  Weight: 89.359 kg (197 lb)    Exam:   General:  NAD; afebrile; complaining of left shoulder pain, numbness and also LUE and LLE weakness  Cardiovascular: no rubs or gallops. S1 and s2; regular rate  Respiratory: CTA bilaterally  Abdomen: soft, NT, ND; positive BS  Neuro: LUE and LLE weakness; gait is also affected due to balance issues; normal speech and CN grossly intact.  Data Reviewed: Basic Metabolic Panel:  Lab 07/19/12 8119  NA 137  K 4.5  CL 100  CO2 29  GLUCOSE 137*  BUN 13  CREATININE 0.91  CALCIUM 10.2  MG --  PHOS --   Liver Function Tests:  Lab 07/19/12 1555  AST 20  ALT 15  ALKPHOS 136*  BILITOT 0.3  PROT 8.6*  ALBUMIN 4.0   CBC:  Lab 07/19/12 1555  WBC 9.5  NEUTROABS 4.8  HGB 13.0  HCT 40.5  MCV 92.0  PLT 341   Cardiac Enzymes:  Lab 07/20/12 0532 07/19/12 2220  CKTOTAL -- --  CKMB -- --  CKMBINDEX -- --  TROPONINI <0.30 <0.30   CBG:  Lab 07/20/12 0746 07/19/12 2143 07/19/12 1541  GLUCAP 119* 177* 135*      Studies: Ct Head Wo Contrast  07/19/2012  *RADIOLOGY REPORT*  Clinical Data: [name] left side weakness.  History of stroke.  CT HEAD WITHOUT CONTRAST  Technique:  Contiguous axial images were obtained from the base of the skull through the vertex without contrast.  Comparison: Brain MRI and head CT 12/26/2009.  Findings: Visualized  paranasal sinuses and mastoids are clear.  No acute osseous abnormality identified.  Visualized orbits and scalp soft tissues are within normal limits.  Chronic advanced small vessel ischemia.  Chronic lacunar infarct in the left thalamus.  Expected evolution of the right brain stem infarct which was acute at the time of the comparison.  Patchy and confluent cerebral white matter hypodensity.  No midline shift, mass effect, or evidence of mass lesion.  No ventriculomegaly. No acute intracranial hemorrhage identified.  No suspicious intracranial vascular hyperdensity. No evidence of cortically based acute infarction identified.  IMPRESSION: Chronically advanced small vessel ischemia. No acute intracranial abnormality.   Original Report Authenticated By: Ulla Potash III, M.D.     Scheduled Meds:   . atorvastatin  20 mg Oral q1800  . citalopram  20 mg Oral QHS  . clopidogrel  75 mg Oral QHS  . enoxaparin  40 mg Subcutaneous QHS  . gabapentin  100 mg Oral TID  . influenza  inactive virus vaccine  0.5 mL Intramuscular Tomorrow-1000  . insulin aspart  0-15 Units Subcutaneous TID WC  . losartan  100 mg Oral QHS  . metoprolol  50 mg Oral BID  . venlafaxine XR  150 mg Oral QHS  . DISCONTD: atorvastatin  10 mg Oral q1800   Continuous Infusions:   . sodium chloride 50 mL/hr at 07/20/12 0038     Time spent: > 30 minutes    Monzerat Handler  [facility] Pager [phone]. If 8PM-8AM, please contact night-coverage at [email or web address], password [facility] 07/20/2012, 10:34 AM  LOS: 1 day

## 2012-07-20 NOTE — Progress Notes (Signed)
*  PRELIMINARY RESULTS* Echocardiogram 2D Echocardiogram has been performed.  Jeryl Columbia 07/20/2012, 2:17 PM

## 2012-07-20 NOTE — Progress Notes (Signed)
*  PRELIMINARY RESULTS* Vascular Ultrasound Carotid Duplex (Doppler) has been completed.  Preliminary findings: bilaterally no significant ICA stenosis with antegrade vertebral flow.  Farrel Demark, RDMS, RVT  07/20/2012, 4:03 PM

## 2012-07-21 DIAGNOSIS — I639 Cerebral infarction, unspecified: Secondary | ICD-10-CM | POA: Diagnosis present

## 2012-07-21 DIAGNOSIS — M4712 Other spondylosis with myelopathy, cervical region: Secondary | ICD-10-CM

## 2012-07-21 DIAGNOSIS — I6330 Cerebral infarction due to thrombosis of unspecified cerebral artery: Secondary | ICD-10-CM

## 2012-07-21 LAB — CARDIOLIPIN ANTIBODIES, IGG, IGM, IGA
Anticardiolipin IgG: 10 GPL U/mL — ABNORMAL LOW (ref <23)
Anticardiolipin IgM: 12 MPL U/mL — ABNORMAL HIGH (ref <11)

## 2012-07-21 LAB — BASIC METABOLIC PANEL
CO2: 24 mEq/L (ref 19–32)
Calcium: 9.4 mg/dL (ref 8.4–10.5)
Creatinine, Ser: 0.86 mg/dL (ref 0.50–1.10)
GFR calc non Af Amer: 73 mL/min — ABNORMAL LOW (ref >90)
Sodium: 137 mEq/L (ref 135–145)

## 2012-07-21 LAB — LUPUS ANTICOAGULANT PANEL
DRVVT: 30.3 secs (ref <45.1)
PTT Lupus Anticoagulant: 27.6 secs — ABNORMAL LOW (ref 28.0–43.0)

## 2012-07-21 LAB — GLUCOSE, CAPILLARY
Glucose-Capillary: 140 mg/dL — ABNORMAL HIGH (ref 70–99)
Glucose-Capillary: 109 mg/dL — ABNORMAL HIGH (ref 70–99)

## 2012-07-21 LAB — BETA-2-GLYCOPROTEIN I ABS, IGG/M/A: Beta-2-Glycoprotein I IgM: 4 M Units (ref <20)

## 2012-07-21 NOTE — Progress Notes (Signed)
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 Left-sided weakness: affecting LUE more than LLE; with associated left shoulder pain and left arm numbness. Will follow MRI and finish stroke workup. Will continue plavix for now as secondary prevention and follow neurology recommendations. Patient has also been started on neurontin for left shoulder discomfort. Continue Pt/OT/SLP. Await CIR  2-HTN (hypertension): essentially stable, especially for acute setting of stroke; will be permissive with hypertensive state. Continue current meds. Pain on her shoulder might be contributing to elevated BP as well. Further adjustment to be done depending on clinical evolution.  3-HLD (hyperlipidemia): LDL 151. Will increase lipitor to 20; patient advise to follow low fatd diet.  4-Depression: continue celexa and effexor; no SI or hallucinations.   5-Diabetes mellitus: will continue holding metformin while inside the hospital; Continue SSI; A1C 6.9 (good control)  6-Hx of MI: no current CP, EKG and telemetry w/o acute ischemia. Will cardiac enzymes negative so far.   DVT: lovenox   Code Status: Full Family Communication:  Disposition Plan: CIR vs SNF   Brief narrative: 61 y.o. female with pmh of HTN, HLD, DM, hx of previously heart attack and remote pontine CVA; came to ED complaining of left side weakness, LUE numbness and difficulty walking. Patient reports that symptoms started gradually around 10-11am after waking up; reports some associated base neck discomfort. Patient came to ED after more than 6 hours of worsening symptoms (reason why code stroke and TPA has been called). Patient denies CP, SOB, abdominal pain, nausea/vomiting, dysuria, cough or any other complaints. CT in ED negative for acute intracranial  abnormalities.   Consultants:  Neuro  Procedures:  CT head (no acute abnormalities)  MRI brain pending  2-D echo pending  Antibiotics:  none  HPI/Subjective: C/o LUE weakness   Objective: Filed Vitals:   07/20/12 2000 07/21/12 0000 07/21/12 0400 07/21/12 0629  BP: 163/95 159/83 190/81 168/90  Pulse: 66 61 56 62  Temp: 98.7 F (37.1 C) 98.6 F (37 C) 98.4 F (36.9 C)   TempSrc: Oral Oral Oral   Resp: 18  18   Height:      Weight:      SpO2: 96% 100% 98%     Intake/Output Summary (Last 24 hours) at 07/21/12 0838 Last data filed at 07/21/12 0700  Gross per 24 hour  Intake 1518.33 ml  Output      0 ml  Net 1518.33 ml   Filed Weights   07/19/12 2145  Weight: 89.359 kg (197 lb)    Exam:   General:  NAD; afebrile; complaining of left shoulder pain, numbness and also LUE and LLE weakness  Cardiovascular: no rubs or gallops. S1 and s2; regular rate  Respiratory: CTA bilaterally  Abdomen: soft, NT, ND; positive BS  Neuro: LUE and LLE weakness; gait is also affected due to balance issues; normal speech and CN grossly intact.  Data Reviewed: Basic Metabolic Panel:  Lab 07/21/12 8295 07/19/12 1555  NA 137 137  K 4.1 4.5  CL 102 100  CO2 24 29  GLUCOSE 163* 137*  BUN 16 13  CREATININE 0.86 0.91  CALCIUM 9.4 10.2  MG -- --  PHOS -- --   Liver Function Tests:  Lab 07/19/12 1555  AST 20  ALT 15  ALKPHOS 136*  BILITOT 0.3  PROT 8.6*  ALBUMIN 4.0   CBC:  Lab 07/19/12 1555  WBC 9.5  NEUTROABS 4.8  HGB 13.0  HCT 40.5  MCV 92.0  PLT 341   Cardiac Enzymes:  Lab 07/20/12 1351 07/20/12 0532 07/19/12 2220  CKTOTAL -- -- --  CKMB -- -- --  CKMBINDEX -- -- --  TROPONINI <0.30 <0.30 <0.30   CBG:  Lab 07/21/12 0732 07/20/12 2015 07/20/12 1640 07/20/12 1312 07/20/12 0746  GLUCAP 149* 149* 251* 166* 119*      Studies: Dg Chest 2 View  07/20/2012  *RADIOLOGY REPORT*  Clinical Data: Stroke  CHEST - 2 VIEW  Comparison: Chest radiograph  03/17/2011 and CT chest 11/23/2006  Findings: Stable mild cardiomegaly.  Lung volumes are low bilaterally.  No airspace disease, effusion, or pneumothorax. Multiple healed left-sided rib fractures.  Midline trachea.  On the lateral view, surgical clips are seen in the posterior upper abdomen.  A small-bore catheter tubing projects over the anterior abdomen.  IMPRESSION: No acute cardiopulmonary disease.  Low lung volumes.   Original Report Authenticated By: Britta Mccreedy, M.D.    Ct Head Wo Contrast  07/19/2012  *RADIOLOGY REPORT*  Clinical Data: 61 year old female left side weakness.  History of stroke.  CT HEAD WITHOUT CONTRAST  Technique:  Contiguous axial images were obtained from the base of the skull through the vertex without contrast.  Comparison: Brain MRI and head CT 12/26/2009.  Findings: Visualized paranasal sinuses and mastoids are clear.  No acute osseous abnormality identified.  Visualized orbits and scalp soft tissues are within normal limits.  Chronic advanced small vessel ischemia.  Chronic lacunar infarct in the left thalamus.  Expected evolution of the right brain stem infarct which was acute at the time of the comparison.  Patchy and confluent cerebral white matter hypodensity.  No midline shift, mass effect, or evidence of mass lesion.  No ventriculomegaly. No acute intracranial hemorrhage identified.  No suspicious intracranial vascular hyperdensity. No evidence of cortically based acute infarction identified.  IMPRESSION: Chronically advanced small vessel ischemia. No acute intracranial abnormality.   Original Report Authenticated By: Harley Hallmark, M.D.    Mr Brain Wo Contrast  07/20/2012  *RADIOLOGY REPORT*  Clinical Data:  Hypertensive diabetic patient with hyperlipidemia with remote pontine infarct presenting with left-sided weakness and difficulty walking.  MRI BRAIN WITHOUT CONTRAST MRA HEAD WITHOUT CONTRAST  Technique: Multiplanar, multiecho pulse sequences of the brain and  surrounding structures were obtained according to standard protocol without intravenous contrast.  Angiographic images of the head were obtained using MRA technique without contrast.  Comparison: 07/19/2012 head CT.  12/26/2009 brain MRI and MRA angiogram.  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  Prominent small vessel disease type changes.  No intracranial hemorrhage.  No intracranial mass lesion detected on this unenhanced exam.  Global atrophy without hydrocephalus.  Minimal paranasal sinus mucosal thickening.  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  Prominent small vessel disease type changes.  This has been made a PRA call report utilizing dashboard call feature.  MRA HEAD  Findings: Motion degraded exam.  Evaluating for aneurysm or grading stenosis is therefore limited.  Anterior circulation without medium or large size vessel significant stenosis or occlusion.  Mild to moderate irregularity of portions of the middle cerebral artery branches bilaterally.  Fetal type origin of the posterior cerebral arteries.  Diminutive size vertebral arteries and basilar artery with ectasia and moderate  to marked tandem stenoses most notable involving the right vertebral artery and mid aspect of the basilar artery.  Poor delineation of the right PICA and both AICAs.  Narrowing of the left PICA.  Superior cerebellar artery distal posterior cerebral artery branch vessel irregularity bilaterally.  IMPRESSION: Motion degraded exam.  Intracranial atherosclerotic type changes as detailed above. Findings are most notable involving the posterior circulation and branch vessels.  MRI CERVICAL SPINE WITHOUT CONTRAST  Technique:  Multiplanar and multiecho pulse sequences of the cervical spine, to include the  craniocervical junction and cervicothoracic junction, were obtained without intravenous contrast.  Comparison:   None  Findings:  Motion degraded exam.  Cervical medullary junction unremarkable.  No focal cervical cord signal abnormality.  Visualized paravertebral structures unremarkable.  Both vertebral arteries are patent.  C2-3:  Negative.  C3-4:  Moderate broad-based disc osteophyte complex greater to the right.  Cord flattening greater on the right.  C4-5:  Broad-based disc osteophyte complex greater to the right. Cord flattening greater on the right.  Mild foraminal narrowing greater on the right.  C5-6:  Broad-based disc osteophyte complex greater to the left. Cord flattening greater on the left.  Minimal to mild bilateral foraminal narrowing.  C6-7:  Negative.  C7-T1:  Mild bilateral facet joint degenerative changes.  Mild bilateral foraminal narrowing.  IMPRESSION: Exam is motion degraded particularly axial images.  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   Original Report Authenticated By: Fuller Canada, M.D.    Mr Cervical Spine Wo Contrast  07/20/2012  *RADIOLOGY REPORT*  Clinical Data:  Hypertensive diabetic patient with hyperlipidemia with remote pontine infarct presenting with left-sided weakness and difficulty walking.  MRI BRAIN WITHOUT CONTRAST MRA HEAD WITHOUT CONTRAST  Technique: Multiplanar, multiecho pulse sequences of the brain and surrounding structures were obtained according to standard protocol without intravenous contrast.  Angiographic images of the head were obtained using MRA technique without contrast.  Comparison: 07/19/2012 head CT.  12/26/2009 brain MRI and MRA angiogram.  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  Prominent small vessel disease type changes.  No intracranial hemorrhage.  No intracranial  mass lesion detected on this unenhanced exam.  Global atrophy without hydrocephalus.  Minimal paranasal sinus mucosal thickening.  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  Prominent small vessel disease type changes.  This has been made a PRA call report utilizing dashboard call feature.  MRA HEAD  Findings: Motion degraded exam.  Evaluating for aneurysm or grading stenosis is therefore limited.  Anterior circulation without medium or large size vessel significant stenosis or occlusion.  Mild to moderate irregularity of portions of the middle cerebral artery branches bilaterally.  Fetal type origin of the posterior cerebral arteries.  Diminutive size vertebral arteries and basilar artery with ectasia and moderate to marked tandem stenoses most notable involving the right vertebral artery and mid aspect of the basilar artery.  Poor delineation of the right PICA and both AICAs.  Narrowing of the left PICA.  Superior cerebellar artery distal posterior cerebral artery branch vessel irregularity bilaterally.  IMPRESSION: Motion degraded exam.  Intracranial atherosclerotic type changes as detailed above. Findings are most notable involving the posterior circulation and branch vessels.  MRI CERVICAL SPINE WITHOUT CONTRAST  Technique:  Multiplanar and multiecho pulse sequences of the cervical spine, to  include the craniocervical junction and cervicothoracic junction, were obtained without intravenous contrast.  Comparison:   None  Findings:  Motion degraded exam.  Cervical medullary junction unremarkable.  No focal cervical cord signal abnormality.  Visualized paravertebral structures unremarkable.  Both vertebral arteries are patent.  C2-3:  Negative.  C3-4:  Moderate broad-based disc osteophyte complex greater to the right.  Cord flattening greater on the right.  C4-5:  Broad-based disc osteophyte complex greater  to the right. Cord flattening greater on the right.  Mild foraminal narrowing greater on the right.  C5-6:  Broad-based disc osteophyte complex greater to the left. Cord flattening greater on the left.  Minimal to mild bilateral foraminal narrowing.  C6-7:  Negative.  C7-T1:  Mild bilateral facet joint degenerative changes.  Mild bilateral foraminal narrowing.  IMPRESSION: Exam is motion degraded particularly axial images.  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   Original Report Authenticated By: Fuller Canada, M.D.    Mr Maxine Glenn Head/brain Wo Cm  07/20/2012  *RADIOLOGY REPORT*  Clinical Data:  Hypertensive diabetic patient with hyperlipidemia with remote pontine infarct presenting with left-sided weakness and difficulty walking.  MRI BRAIN WITHOUT CONTRAST MRA HEAD WITHOUT CONTRAST  Technique: Multiplanar, multiecho pulse sequences of the brain and surrounding structures were obtained according to standard protocol without intravenous contrast.  Angiographic images of the head were obtained using MRA technique without contrast.  Comparison: 07/19/2012 head CT.  12/26/2009 brain MRI and MRA angiogram.  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  Prominent small vessel disease type changes.  No intracranial hemorrhage.  No intracranial mass lesion detected on this unenhanced exam.  Global atrophy without hydrocephalus.  Minimal paranasal sinus mucosal thickening.  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  Prominent small vessel disease type changes.  This has been made a PRA call report utilizing dashboard call feature.  MRA HEAD  Findings: Motion degraded exam.  Evaluating for aneurysm or grading stenosis is therefore limited.   Anterior circulation without medium or large size vessel significant stenosis or occlusion.  Mild to moderate irregularity of portions of the middle cerebral artery branches bilaterally.  Fetal type origin of the posterior cerebral arteries.  Diminutive size vertebral arteries and basilar artery with ectasia and moderate to marked tandem stenoses most notable involving the right vertebral artery and mid aspect of the basilar artery.  Poor delineation of the right PICA and both AICAs.  Narrowing of the left PICA.  Superior cerebellar artery distal posterior cerebral artery branch vessel irregularity bilaterally.  IMPRESSION: Motion degraded exam.  Intracranial atherosclerotic type changes as detailed above. Findings are most notable involving the posterior circulation and branch vessels.  MRI CERVICAL SPINE WITHOUT CONTRAST  Technique:  Multiplanar and multiecho pulse sequences of the cervical spine, to include the craniocervical junction and cervicothoracic junction, were obtained without intravenous contrast.  Comparison:   None  Findings:  Motion degraded exam.  Cervical medullary junction unremarkable.  No focal cervical cord signal abnormality.  Visualized paravertebral structures unremarkable.  Both vertebral arteries are patent.  C2-3:  Negative.  C3-4:  Moderate broad-based disc osteophyte complex greater to the right.  Cord flattening greater on the right.  C4-5:  Broad-based disc osteophyte complex greater to the right. Cord flattening greater on the right.  Mild foraminal  narrowing greater on the right.  C5-6:  Broad-based disc osteophyte complex greater to the left. Cord flattening greater on the left.  Minimal to mild bilateral foraminal narrowing.  C6-7:  Negative.  C7-T1:  Mild bilateral facet joint degenerative changes.  Mild bilateral foraminal narrowing.  IMPRESSION: Exam is motion degraded particularly axial images.  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   Original Report Authenticated By: Fuller Canada, M.D.     Scheduled Meds:    .  stroke: mapping our early stages of recovery book   Does not apply Once  . atorvastatin  20 mg Oral q1800  . citalopram  20 mg Oral QHS  . clopidogrel  75 mg Oral QHS  . enoxaparin  40 mg Subcutaneous QHS  . gabapentin  100 mg Oral TID  . influenza  inactive virus vaccine  0.5 mL Intramuscular Tomorrow-1000  . insulin aspart  0-15 Units Subcutaneous TID WC  . losartan  100 mg Oral QHS  . metoprolol  50 mg Oral BID  . venlafaxine XR  150 mg Oral QHS  . DISCONTD: atorvastatin  10 mg Oral q1800   Continuous Infusions:    . sodium chloride 50 mL/hr at 07/20/12 0038         Darren Nodal  [facility] Pager [phone]. If 8PM-8AM, please contact night-coverage at [email or web address], password [facility] 07/21/2012, 8:38 AM  LOS: 2 days

## 2012-07-21 NOTE — Progress Notes (Signed)
Physical Therapy Treatment Patient Details Name: [name] MRN: [id number] DOB: Jun 01, 1951 Today's Date: 07/21/2012 Time: 7829-5621 PT Time Calculation (min): 49 min  PT Assessment / Plan / Recommendation Comments on Treatment Session  Pt very motivated to improve mobility.  Pt is very interested in CIR.  Does not want NHP.  No family present during session.    Follow Up Recommendations  Inpatient Rehab;Supervision for mobility/OOB    Barriers to Discharge        Equipment Recommendations  Wheelchair (measurements);Other (comment) (hemiwalker)    Recommendations for Other Services Rehab consult  Frequency Min 4X/week   Plan Discharge plan remains appropriate    Precautions / Restrictions Precautions Precautions: Fall Precaution Comments: left weakness   Pertinent Vitals/Pain C/o left shoulder pain.      Mobility  Bed Mobility Bed Mobility: Not assessed (Pt up in chair) Transfers Transfers: Sit to Stand;Stand to Sit Sit to Stand: 4: Min assist;With upper extremity assist;Without upper extremity assist;With armrests;From chair/3-in-1 (Practiced multiple times fro strengthing and safety) Stand to Sit: 4: Min assist;With upper extremity assist;Without upper extremity assist;With armrests;To chair/3-in-1 Details for Transfer Assistance: Pt unsteady when first standing and needs time/assist to gain balance Ambulation/Gait Ambulation/Gait Assistance: 4: Min assist Ambulation Distance (Feet): 15 Feet (Also ambulated twice 4 feet each without device) Assistive device: Hemi-walker Ambulation/Gait Assistance Details: Frequent cues for hemiwalker use and sequencing Gait Pattern: Step-to pattern;Wide base of support;Trunk flexed Gait velocity: decreased General Gait Details: Second person utilized for safety and to push IV pole/chair Stairs: No Wheelchair Mobility Wheelchair Mobility: No Modified Rankin (Stroke Patients Only) Pre-Morbid Rankin Score: No symptoms Modified  Rankin: Moderately severe disability    Exercises     PT Diagnosis:    PT Problem List:   PT Treatment Interventions:     PT Goals Acute Rehab PT Goals Time For Goal Achievement: 08/03/12 Potential to Achieve Goals: Good Pt will go Supine/Side to Sit: with supervision;with HOB 0 degrees PT Goal: Supine/Side to Sit - Progress: Other (comment) (NT) Pt will go Sit to Supine/Side: with min assist;with HOB 0 degrees PT Goal: Sit to Supine/Side - Progress: Other (comment) (NT) Pt will go Sit to Stand: with supervision PT Goal: Sit to Stand - Progress: Progressing toward goal Pt will go Stand to Sit: with supervision PT Goal: Stand to Sit - Progress: Progressing toward goal Pt will Ambulate: 51 - 150 feet;with least restrictive assistive device;with supervision PT Goal: Ambulate - Progress: Progressing toward goal Pt will Go Up / Down Stairs: 6-9 stairs;with min assist;with rail(s) PT Goal: Up/Down Stairs - Progress: Other (comment) (NT)  Visit Information  Last PT Received On: 07/21/12 Assistance Needed: +2    Subjective Data  Subjective: Did I have a stroke? Patient Stated Goal: be able to return home   Cognition  Overall Cognitive Status: Appears within functional limits for tasks assessed/performed Arousal/Alertness: Awake/alert Orientation Level: Appears intact for tasks assessed Behavior During Session: [facility] for tasks performed Safety/Judgement - Other Comments: Pt educated for need for assist with mobility    Balance  Static Standing Balance Static Standing - Balance Support: No upper extremity supported Static Standing - Level of Assistance: 4: Min assist Static Standing - Comment/# of Minutes: Stood 3 times for about 2 minutes each time.  performed marching with BLE 5 reps each leg.   End of Session PT - End of Session Equipment Utilized During Treatment: Gait belt Activity Tolerance: Patient tolerated treatment well Patient left: in chair;with chair alarm set Nurse  Communication: Mobility status   GP     Donnella Sham 07/21/2012, 11:54 AM [place], PT  [phone] 07/21/2012

## 2012-07-21 NOTE — Evaluation (Signed)
Occupational Therapy Evaluation Patient Details Name: [name] MRN: [id number] DOB: 1950-10-28 Today's Date: 07/21/2012 Time: 4098-1191 OT Time Calculation (min): 21 min  OT Assessment / Plan / Recommendation Clinical Impression  Pt admitted for R posterior frontal lobe infact resulting in L hemiparesis with deficits listed below.  Pt would benefit from cont OT to increase I with adls and become safer with all mobility due to new hemiplegia.    OT Assessment  Patient needs continued OT Services    Follow Up Recommendations  Inpatient Rehab    Barriers to Discharge Decreased caregiver support hsuband works during the day.  Pt is home alone.  Equipment Recommendations  Tub/shower bench;Other (comment) (thinks she has a 3:1 at home but not sure.)    Recommendations for Other Services Rehab consult  Frequency  Min 2X/week    Precautions / Restrictions Precautions Precautions: Fall Precaution Comments: left weakness Restrictions Weight Bearing Restrictions: No   Pertinent Vitals/Pain Pt w no c/o pain    ADL  Eating/Feeding: Performed;Set up;Other (comment) (occasional min assist since using non dom hand to eat.) Where Assessed - Eating/Feeding: Chair Grooming: Performed;Teeth care;Wash/dry hands;Minimal assistance Where Assessed - Grooming: Supported sitting Upper Body Bathing: Simulated;Minimal assistance Where Assessed - Upper Body Bathing: Supported sitting Lower Body Bathing: Simulated;Maximal assistance Where Assessed - Lower Body Bathing: Supported sit to stand Upper Body Dressing: Performed;Maximal assistance;Other (comment) (hemi dressing technique) Where Assessed - Upper Body Dressing: Supported sitting Lower Body Dressing: Performed;Maximal assistance Where Assessed - Lower Body Dressing: Supported sit to Pharmacist, hospital: Performed;Moderate assistance Toilet Transfer Method: Stand pivot Toilet Transfer Equipment: Bedside commode Toileting - Clothing  Manipulation and Hygiene: Performed;Moderate assistance Where Assessed - Toileting Clothing Manipulation and Hygiene: Standing Transfers/Ambulation Related to ADLs: Pt unsteady and unsafe during transfers.  Pt with decreased awareness of her limitations and feels she can transfer w/o assist.   ADL Comments: Pt with great deal of assist needed for adls due to lack of movement in LUE.    OT Diagnosis: Generalized weakness;Cognitive deficits;Hemiplegia dominant side  OT Problem List: Decreased strength;Decreased range of motion;Impaired balance (sitting and/or standing);Decreased coordination;Decreased cognition;Decreased safety awareness;Decreased knowledge of use of DME or AE;Decreased knowledge of precautions;Impaired sensation;Impaired UE functional use;Pain OT Treatment Interventions: Self-care/ADL training;Neuromuscular education;DME and/or AE instruction;Therapeutic activities   OT Goals Acute Rehab OT Goals OT Goal Formulation: With patient Time For Goal Achievement: 08/04/12 Potential to Achieve Goals: Good ADL Goals Pt Will Perform Eating: with set-up;Sitting, chair ADL Goal: Eating - Progress: Goal set today Pt Will Perform Grooming: Supported;Standing at sink;with supervision ADL Goal: Grooming - Progress: Goal set today Pt Will Perform Upper Body Bathing: with min assist;Sitting at sink ADL Goal: Upper Body Bathing - Progress: Goal set today Pt Will Perform Upper Body Dressing: with min assist;Sit to stand from chair;Supported ADL Goal: Location manager Dressing - Progress: Goal set today Pt Will Perform Tub/Shower Transfer: Tub transfer;Transfer tub bench;with min assist ADL Goal: Tub/Shower Transfer - Progress: Goal set today Additional ADL Goal #1: PT will complete all aspects of toileting with 3:1 over commode and S. ADL Goal: Additional Goal #1 - Progress: Goal set today Arm Goals Additional Arm Goal #1: PT will be independent with SROM of LUE to maintain ROM of this side for  future adls. Arm Goal: Additional Goal #1 - Progress: Goal set today  Visit Information  Last OT Received On: 07/21/12 Assistance Needed: +1    Subjective Data  Subjective: "I just want to go on my cruise  in Dec." Patient Stated Goal: to get better   Prior Functioning     Home Living Lives With: Spouse Available Help at Discharge: Family;Available PRN/intermittently Type of Home: House Home Access: Ramped entrance Home Layout: Two level;Bed/bath upstairs Alternate Level Stairs-Number of Steps: 13 Alternate Level Stairs-Rails: Right Bathroom Shower/Tub: Tub/shower unit;Walk-in shower Bathroom Toilet: Standard Home Adaptive Equipment: Bedside commode/3-in-1;Walker - rolling;Straight cane;Other (comment) Additional Comments: Pt dgtr has cerebral palsy and equipment left from dgtr who currently lives out of the home.  Prior Function Level of Independence: Independent Able to Take Stairs?: Yes Driving: Yes Vocation: On disability Communication Communication: No difficulties Dominant Hand: Left         Vision/Perception Vision - Assessment Vision Assessment: Vision not tested Perception Perception: Impaired Inattention/Neglect: Does not attend to left side of body   Cognition  Overall Cognitive Status: Impaired Area of Impairment: Attention;Safety/judgement;Awareness of deficits Arousal/Alertness: Awake/alert Orientation Level: Oriented X4 / Intact Behavior During Session: [facility] for tasks performed Current Attention Level: Selective Attention - Other Comments: Pt was easily distracted.  Had a difficult time focusing on the things that were being evaluated.  Pt mildly hyperverbal and this may be premorbid.  Feel she did not hear most of what therapist was instructing. Safety/Judgement: Decreased awareness of safety precautions;Decreased safety judgement for tasks assessed;Impulsive;Decreased awareness of need for assistance Safety/Judgement - Other Comments: Pt educated  for need for assist with mobility Awareness of Deficits: Pt feels she could go home and take care of herself "somehow."  Pt did admit by end of session to needing some rehab before returning home. Cognition - Other Comments: Pt may have had "impulsive" and talkative personality beforehand.  Feel she will be a fall risk due to wanting to get up and do for herself all the time.    Extremity/Trunk Assessment Right Upper Extremity Assessment RUE ROM/Strength/Tone: Within functional levels RUE Sensation: WFL - Light Touch RUE Coordination: WFL - gross/fine motor Left Upper Extremity Assessment LUE ROM/Strength/Tone: Deficits LUE ROM/Strength/Tone Deficits: Shoulder 1/5, elbow flexion and extension 1/5, wrist and hand 0/5. LUE Sensation: Deficits LUE Sensation Deficits: Pt with decreased light touch but is able to feel. LUE Coordination: Deficits LUE Coordination Deficits: pt unable to grip on LUE or perform finger to thumb with any digit     Mobility Bed Mobility Bed Mobility: Not assessed Transfers Transfers: Sit to Stand;Stand to Sit Sit to Stand: 4: Min assist;Without upper extremity assist;From chair/3-in-1;With armrests Stand to Sit: 4: Min assist;With upper extremity assist;Without upper extremity assist;With armrests;To chair/3-in-1 Details for Transfer Assistance: Pt with greater difficulty moving LLE than RLE.  Pt unsteady when first standing and should not be standing alone.     Shoulder Instructions     Exercise Other Exercises Other Exercises: Educated pt on PROM exercises for L side.  Pt needs reinforcement.   Balance Static Standing Balance Static Standing - Balance Support: No upper extremity supported Static Standing - Level of Assistance: 4: Min assist Static Standing - Comment/# of Minutes: Stood 3 times for about 2 minutes each time.  performed marching with BLE 5 reps each leg.    End of Session OT - End of Session Activity Tolerance: Patient tolerated treatment  well Patient left: in chair;with call bell/phone within reach Nurse Communication: Mobility status  GO     Hope Budds 07/21/2012, 12:35 PM [phone]

## 2012-07-21 NOTE — Clinical Social Work Psychosocial (Signed)
     Clinical Social Work Department BRIEF PSYCHOSOCIAL ASSESSMENT 07/21/2012  Patient:  [name], [name]     Account Number:  [id number]     Admit date:  07/19/2012  Clinical Social Worker:  Doree Albee  Date/Time:  07/21/2012 05:06 PM  Referred by:  RN  Date Referred:  07/21/2012 Referred for  SNF Placement   Other Referral:   Interview type:  Patient Other interview type:    PSYCHOSOCIAL DATA Living Status:  FAMILY Admitted from facility:   Level of care:   Primary support name:  Pieri,Theldelroe Primary support relationship to patient:  SPOUSE Degree of support available:   strong    CURRENT CONCERNS Current Concerns  Post-Acute Placement   Other Concerns:    SOCIAL WORK ASSESSMENT / PLAN CSW met with pt at bedside to complete psychosocial assessment and assist with pt dc plans. Pt son was visiting pt at time of assessment.    Pt shared that she lives at home with pt spouse. Pt shared that she is hopeful to be admitted to [facility]. CSW and pt discussed the need for back up options as [facility] is currently full.    Pt verbalized understanding and interest in snf. CSW will initiate snf search for patient. Pt is interested in skilled nursing [facility] for rehab.   Assessment/plan status:  Psychosocial Support/Ongoing Assessment of Needs Other assessment/ plan:   Information/referral to community resources:   skilled nursing facility    PATIENTS/FAMILYS RESPONSE TO PLAN OF CARE: Pt thanked csw for concern and support. Pt is motivated to regain strength and to return home. Pt hopeful for CIR and open to snf for rehab.

## 2012-07-21 NOTE — Progress Notes (Signed)
Stroke Team Progress Note  HISTORY [name] is an 61 y.o. female with a history of a previous stroke causing left hemiparesis who presents with onset a left hemiparesis that started this morning 07/19/2012. She does feel that it got slightly worse later, when she noticed that she was falling. She states that it is similar to her previous stroke symptoms, however she did not have as much difficulty with walking with her previous stroke. She denies any recent illness, cold, burning with urination, other signs of infection. She takes Plavix at home for stroke prevention. Patient was not a TPA candidate secondary to delay in arrival. She was admitted  for further evaluation and treatment.  SUBJECTIVE "How can a person go to bed normal and wake up a different person?" pt admits she has cholesterol pills - she stopped taking - the doctor never told her to stop, but he never told her to continue.  OBJECTIVE Most recent Vital Signs: Filed Vitals:   07/20/12 2000 07/21/12 0000 07/21/12 0400 07/21/12 0629  BP: 163/95 159/83 190/81 168/90  Pulse: 66 61 56 62  Temp: 98.7 F (37.1 C) 98.6 F (37 C) 98.4 F (36.9 C)   TempSrc: Oral Oral Oral   Resp: 18  18   Height:      Weight:      SpO2: 96% 100% 98%    CBG (last 3)   Basename 07/21/12 0732 07/20/12 2015 07/20/12 1640  GLUCAP 149* 149* 251*   Intake/Output from previous day: 09/24 0701 - 09/25 0700 In: 1518.3 [I.V.:1518.3] Out: -   IV Fluid Intake:      . sodium chloride 50 mL/hr at 07/20/12 0038    MEDICATIONS     .  stroke: mapping our early stages of recovery book   Does not apply Once  . atorvastatin  20 mg Oral q1800  . citalopram  20 mg Oral QHS  . clopidogrel  75 mg Oral QHS  . enoxaparin  40 mg Subcutaneous QHS  . gabapentin  100 mg Oral TID  . influenza  inactive virus vaccine  0.5 mL Intramuscular Tomorrow-1000  . insulin aspart  0-15 Units Subcutaneous TID WC  . losartan  100 mg Oral QHS  . metoprolol  50  mg Oral BID  . venlafaxine XR  150 mg Oral QHS   PRN:  morphine injection, ondansetron (ZOFRAN) IV, senna-docusate, traMADol  Diet:  Carb Control thin liquids Activity:  Up with assistance DVT Prophylaxis:  Lovenox 40 mg sq daily   CLINICALLY SIGNIFICANT STUDIES Basic Metabolic Panel:   Lab 07/21/12 0557 07/19/12 1555  NA 137 137  K 4.1 4.5  CL 102 100  CO2 24 29  GLUCOSE 163* 137*  BUN 16 13  CREATININE 0.86 0.91  CALCIUM 9.4 10.2  MG -- --  PHOS -- --   Liver Function Tests:   Lab 07/19/12 1555  AST 20  ALT 15  ALKPHOS 136*  BILITOT 0.3  PROT 8.6*  ALBUMIN 4.0   CBC:   Lab 07/19/12 1555  WBC 9.5  NEUTROABS 4.8  HGB 13.0  HCT 40.5  MCV 92.0  PLT 341   Coagulation:   Lab 07/19/12 1555  LABPROT 12.7  INR 0.96   Cardiac Enzymes:   Lab 07/20/12 1351 07/20/12 0532 07/19/12 2220  CKTOTAL -- -- --  CKMB -- -- --  CKMBINDEX -- -- --  TROPONINI <0.30 <0.30 <0.30   Lipid Panel    Component Value Date/Time   CHOL 230*  07/20/2012 0535   TRIG 108 07/20/2012 0535   HDL 57 07/20/2012 0535   CHOLHDL 4.0 07/20/2012 0535   VLDL 22 07/20/2012 0535   LDLCALC 151* 07/20/2012 0535   HgbA1C  Lab Results  Component Value Date   HGBA1C 6.9* 07/19/2012    Urine Drug Screen:   No results found for this basename: labopia,  cocainscrnur,  labbenz,  amphetmu,  thcu,  labbarb    Alcohol Level: No results found for this basename: ETH:2 in the last 168 hours  CT of the brain  07/19/2012 Chronically advanced small vessel ischemia. No acute intracranial abnormality  MRI of the brain  07/20/2012  Acute non hemorrhagic posterior right frontal lobe infarct.  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  Prominent small vessel disease type changes.  MRI Cervical Spine 07/20/2012  Exam is motion degraded particularly axial images.  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     MRA of the brain   07/20/2012 .  Motion degraded exam.  Intracranial atherosclerotic type changes as detailed above. Findings are most notable involving the posterior circulation and branch vessels.   2D Echocardiogram   EF 45-50% with no source of embolus. Mild LVH. Diffuse hypokinesis.  Carotid Doppler  No evidence of hemodynamically significant internal carotid artery stenosis. Vertebral artery flow is antegrade.   CXR  07/20/2012   No acute cardiopulmonary disease.  Low lung volumes.   EKG  normal sinus rhythm.   Therapy Recommendations PT - CIR ; OT - ; ST -   Physical Exam   Pleasant young African American lady currently not in distress.Awake alert. Afebrile. Head is nontraumatic. Neck is supple without bruit. Hearing is normal. Cardiac exam no murmur or gallop. Lungs are clear to auscultation. Distal pulses are well felt.  Neurological Exam : Awake alert oriented x 3 normal speech and language.fundi not visualized. Vision acuity and fields appear normal. Extraocular moments are full range without nystagmus. Mild left lower face asymmetry. Tongue midline. No drift. Mild diminished fine finger movements on left. Orbits right over left upper extremity. Mild left grip weak.. Normal sensation . Normal coordination. Plantars both downgoing. Gait was not tested.  ASSESSMENT Ms. [name] is a 61 y.o. female presenting with left hemiparesis. MRI reveals a new posterior right frontal lobe infarct in setting of old right pontine infarct and bilateral thalamic/basal ganglia infarcts. Infarcts secondary small vessel disease. On clopidogrel 75 mg orally every day prior to admission. Continues clopidogrel 75 mg orally every day for secondary stroke prevention. Patient with resultant left hemiparesis that is improving. Inpatient rehab recommended.   CAD - MI  Stroke Hypertension Diabetes, HgbA1c pending Sleep apnea Hyperlipidemia, LDL 151, on new statin   Hospital day # 2  TREATMENT/PLAN  Continue  clopidogrel 75 mg orally every day for secondary stroke prevention. Consider ACCELERATE trial, use of Cholesteryl Ester Transfer Protein (CETP) Inhibitor: Potential of Evacetrapib to treat atherosclerosis and CAD. [facility] will contact patient with information about trial, screen for inclusion. Ongoing risk factor control Stroke Service will sign off. Follow up with Dr. Pearlean Brownie, Stroke Clinic, in 2 months.  Annie Main, MSN, RN, ANVP-BC, ANP-BC, [facility] Pager: [phone] 07/21/2012 10:45 AM  Scribe for Dr. Delia Heady, Stroke Center Medical Director, who has personally reviewed chart, pertinent data, examined the patient and developed the plan of care. Pager:  [phone]

## 2012-07-22 ENCOUNTER — Encounter (HOSPITAL_COMMUNITY): Payer: Self-pay | Admitting: Physical Medicine and Rehabilitation

## 2012-07-22 ENCOUNTER — Inpatient Hospital Stay (HOSPITAL_COMMUNITY)
Admission: RE | Admit: 2012-07-22 | Discharge: 2012-08-06 | DRG: 462 | Disposition: A | Discharge status: Home Health | Payer: BC Managed Care – PPO | Source: Ambulatory Visit | Attending: Physical Medicine & Rehabilitation | Admitting: Physical Medicine & Rehabilitation

## 2012-07-22 DIAGNOSIS — I252 Old myocardial infarction: Secondary | ICD-10-CM

## 2012-07-22 DIAGNOSIS — I639 Cerebral infarction, unspecified: Secondary | ICD-10-CM | POA: Diagnosis present

## 2012-07-22 DIAGNOSIS — F329 Major depressive disorder, single episode, unspecified: Secondary | ICD-10-CM | POA: Diagnosis present

## 2012-07-22 DIAGNOSIS — Z85528 Personal history of other malignant neoplasm of kidney: Secondary | ICD-10-CM

## 2012-07-22 DIAGNOSIS — I6330 Cerebral infarction due to thrombosis of unspecified cerebral artery: Secondary | ICD-10-CM

## 2012-07-22 DIAGNOSIS — I129 Hypertensive chronic kidney disease with stage 1 through stage 4 chronic kidney disease, or unspecified chronic kidney disease: Secondary | ICD-10-CM

## 2012-07-22 DIAGNOSIS — M47812 Spondylosis without myelopathy or radiculopathy, cervical region: Secondary | ICD-10-CM

## 2012-07-22 DIAGNOSIS — R32 Unspecified urinary incontinence: Secondary | ICD-10-CM

## 2012-07-22 DIAGNOSIS — Z7902 Long term (current) use of antithrombotics/antiplatelets: Secondary | ICD-10-CM

## 2012-07-22 DIAGNOSIS — G4733 Obstructive sleep apnea (adult) (pediatric): Secondary | ICD-10-CM

## 2012-07-22 DIAGNOSIS — Z5189 Encounter for other specified aftercare: Principal | ICD-10-CM

## 2012-07-22 DIAGNOSIS — R269 Unspecified abnormalities of gait and mobility: Secondary | ICD-10-CM

## 2012-07-22 DIAGNOSIS — I672 Cerebral atherosclerosis: Secondary | ICD-10-CM

## 2012-07-22 DIAGNOSIS — M129 Arthropathy, unspecified: Secondary | ICD-10-CM

## 2012-07-22 DIAGNOSIS — I6350 Cerebral infarction due to unspecified occlusion or stenosis of unspecified cerebral artery: Principal | ICD-10-CM

## 2012-07-22 DIAGNOSIS — G8110 Spastic hemiplegia affecting unspecified side: Secondary | ICD-10-CM

## 2012-07-22 DIAGNOSIS — G8190 Hemiplegia, unspecified affecting unspecified side: Secondary | ICD-10-CM

## 2012-07-22 DIAGNOSIS — Z79899 Other long term (current) drug therapy: Secondary | ICD-10-CM

## 2012-07-22 DIAGNOSIS — E785 Hyperlipidemia, unspecified: Secondary | ICD-10-CM

## 2012-07-22 DIAGNOSIS — I2510 Atherosclerotic heart disease of native coronary artery without angina pectoris: Secondary | ICD-10-CM

## 2012-07-22 DIAGNOSIS — N3289 Other specified disorders of bladder: Secondary | ICD-10-CM

## 2012-07-22 DIAGNOSIS — E119 Type 2 diabetes mellitus without complications: Secondary | ICD-10-CM

## 2012-07-22 DIAGNOSIS — N189 Chronic kidney disease, unspecified: Secondary | ICD-10-CM

## 2012-07-22 DIAGNOSIS — Z9884 Bariatric surgery status: Secondary | ICD-10-CM

## 2012-07-22 DIAGNOSIS — Z8673 Personal history of transient ischemic attack (TIA), and cerebral infarction without residual deficits: Secondary | ICD-10-CM

## 2012-07-22 LAB — FACTOR 5 LEIDEN

## 2012-07-22 LAB — GLUCOSE, CAPILLARY
Glucose-Capillary: 174 mg/dL — ABNORMAL HIGH (ref 70–99)
Glucose-Capillary: 127 mg/dL — ABNORMAL HIGH (ref 70–99)
Glucose-Capillary: 144 mg/dL — ABNORMAL HIGH (ref 70–99)

## 2012-07-22 MED ORDER — PROCHLORPERAZINE 25 MG RE SUPP
12.5000 mg | Freq: Four times a day (QID) | RECTAL | Status: DC | PRN
  Filled 2012-07-22: qty 1

## 2012-07-22 MED ORDER — TRAZODONE HCL 50 MG PO TABS: 25.0000 mg | ORAL_TABLET | Freq: Every evening | ORAL | Status: DC | PRN

## 2012-07-22 MED ORDER — INSULIN ASPART 100 UNIT/ML ~~LOC~~ SOLN
0.0000 [IU] | Freq: Three times a day (TID) | SUBCUTANEOUS | Status: DC
  Administered 2012-07-23 – 2012-07-26 (×9): 2 [IU] via SUBCUTANEOUS
  Administered 2012-07-26: 5 [IU] via SUBCUTANEOUS
  Administered 2012-07-27 – 2012-07-28 (×2): 2 [IU] via SUBCUTANEOUS
  Administered 2012-07-28: 3 [IU] via SUBCUTANEOUS
  Administered 2012-07-29 (×2): 2 [IU] via SUBCUTANEOUS
  Administered 2012-07-30: 3 [IU] via SUBCUTANEOUS
  Administered 2012-07-31 (×2): 2 [IU] via SUBCUTANEOUS
  Administered 2012-08-01: 3 [IU] via SUBCUTANEOUS
  Administered 2012-08-02 (×2): 2 [IU] via SUBCUTANEOUS
  Administered 2012-08-02 – 2012-08-05 (×2): 3 [IU] via SUBCUTANEOUS
  Administered 2012-08-06: 2 [IU] via SUBCUTANEOUS

## 2012-07-22 MED ORDER — ENOXAPARIN SODIUM 40 MG/0.4ML ~~LOC~~ SOLN
40.0000 mg | SUBCUTANEOUS | Status: DC
  Administered 2012-07-22 – 2012-08-05 (×15): 40 mg via SUBCUTANEOUS
  Filled 2012-07-22 (×16): qty 0.4

## 2012-07-22 MED ORDER — ATORVASTATIN CALCIUM 20 MG PO TABS: 20.0000 mg | ORAL_TABLET | Freq: Every day | ORAL | Status: DC

## 2012-07-22 MED ORDER — VENLAFAXINE HCL ER 150 MG PO CP24
150.0000 mg | ORAL_CAPSULE | Freq: Every day | ORAL | Status: DC
  Administered 2012-07-22 – 2012-08-01 (×11): 150 mg via ORAL
  Filled 2012-07-22 (×12): qty 1

## 2012-07-22 MED ORDER — GUAIFENESIN-DM 100-10 MG/5ML PO SYRP: 5.0000 mL | ORAL_SOLUTION | Freq: Four times a day (QID) | ORAL | Status: DC | PRN

## 2012-07-22 MED ORDER — LOSARTAN POTASSIUM 50 MG PO TABS
100.0000 mg | ORAL_TABLET | Freq: Every day | ORAL | Status: DC
  Administered 2012-07-22 – 2012-08-05 (×15): 100 mg via ORAL
  Filled 2012-07-22 (×16): qty 2

## 2012-07-22 MED ORDER — TRAMADOL HCL 50 MG PO TABS: 50.0000 mg | ORAL_TABLET | Freq: Four times a day (QID) | ORAL | Status: DC | PRN

## 2012-07-22 MED ORDER — METOPROLOL TARTRATE 50 MG PO TABS
50.0000 mg | ORAL_TABLET | Freq: Two times a day (BID) | ORAL | Status: DC
  Administered 2012-07-22 – 2012-07-24 (×5): 50 mg via ORAL
  Filled 2012-07-22 (×8): qty 1

## 2012-07-22 MED ORDER — ALUM & MAG HYDROXIDE-SIMETH 200-200-20 MG/5ML PO SUSP: 30.0000 mL | ORAL | Status: DC | PRN

## 2012-07-22 MED ORDER — ACETAMINOPHEN 325 MG PO TABS: 325.0000 mg | ORAL_TABLET | ORAL | Status: DC | PRN

## 2012-07-22 MED ORDER — BISACODYL 10 MG RE SUPP
10.0000 mg | Freq: Every day | RECTAL | Status: DC | PRN
  Filled 2012-07-22 (×2): qty 1

## 2012-07-22 MED ORDER — GABAPENTIN 100 MG PO CAPS
100.0000 mg | ORAL_CAPSULE | Freq: Three times a day (TID) | ORAL | Status: DC
  Administered 2012-07-23 – 2012-08-06 (×43): 100 mg via ORAL
  Filled 2012-07-22 (×50): qty 1

## 2012-07-22 MED ORDER — TRAMADOL HCL 50 MG PO TABS: 50.0000 mg | ORAL_TABLET | Freq: Two times a day (BID) | ORAL | Status: DC | PRN

## 2012-07-22 MED ORDER — CITALOPRAM HYDROBROMIDE 20 MG PO TABS
20.0000 mg | ORAL_TABLET | Freq: Every day | ORAL | Status: DC
  Administered 2012-07-22 – 2012-08-05 (×15): 20 mg via ORAL
  Filled 2012-07-22 (×16): qty 1

## 2012-07-22 MED ORDER — PROCHLORPERAZINE MALEATE 5 MG PO TABS
5.0000 mg | ORAL_TABLET | Freq: Four times a day (QID) | ORAL | Status: DC | PRN
  Filled 2012-07-22: qty 2

## 2012-07-22 MED ORDER — POLYETHYLENE GLYCOL 3350 17 G PO PACK
17.0000 g | PACK | Freq: Every day | ORAL | Status: DC | PRN
  Administered 2012-07-28: 17 g via ORAL
  Filled 2012-07-22: qty 1

## 2012-07-22 MED ORDER — GABAPENTIN 100 MG PO CAPS: 100.0000 mg | ORAL_CAPSULE | Freq: Three times a day (TID) | ORAL | Status: DC

## 2012-07-22 MED ORDER — METFORMIN HCL 500 MG PO TABS
500.0000 mg | ORAL_TABLET | Freq: Two times a day (BID) | ORAL | Status: DC
  Administered 2012-07-23 – 2012-08-06 (×29): 500 mg via ORAL
  Filled 2012-07-22 (×32): qty 1

## 2012-07-22 MED ORDER — ATORVASTATIN CALCIUM 20 MG PO TABS
20.0000 mg | ORAL_TABLET | Freq: Every day | ORAL | Status: DC
  Administered 2012-07-23 – 2012-08-05 (×14): 20 mg via ORAL
  Filled 2012-07-22 (×15): qty 1

## 2012-07-22 MED ORDER — CLOPIDOGREL BISULFATE 75 MG PO TABS
75.0000 mg | ORAL_TABLET | Freq: Every day | ORAL | Status: DC
  Administered 2012-07-22 – 2012-08-05 (×15): 75 mg via ORAL
  Filled 2012-07-22 (×16): qty 1

## 2012-07-22 MED ORDER — PROCHLORPERAZINE EDISYLATE 5 MG/ML IJ SOLN
5.0000 mg | Freq: Four times a day (QID) | INTRAMUSCULAR | Status: DC | PRN
  Filled 2012-07-22: qty 2

## 2012-07-22 MED ORDER — DIPHENHYDRAMINE HCL 12.5 MG/5ML PO ELIX: 12.5000 mg | ORAL_SOLUTION | Freq: Four times a day (QID) | ORAL | Status: DC | PRN

## 2012-07-22 MED ORDER — SENNOSIDES-DOCUSATE SODIUM 8.6-50 MG PO TABS: 1.0000 | ORAL_TABLET | Freq: Every evening | ORAL | Status: DC | PRN

## 2012-07-22 MED ORDER — MORPHINE SULFATE 2 MG/ML IJ SOLN: 2.0000 mg | Freq: Four times a day (QID) | INTRAMUSCULAR | Status: DC | PRN

## 2012-07-22 MED ORDER — FLEET ENEMA 7-19 GM/118ML RE ENEM: 1.0000 | ENEMA | Freq: Once | RECTAL | Status: AC | PRN

## 2012-07-22 NOTE — Progress Notes (Signed)
CSW was informed by CIR admissions, that patient has been accepted and has bed availability in CIR today. Pt plans to dc today to CIR. Pt and MD aware. .No further Clinical Social Work needs, signing off.   Catha Gosselin, Theresia Majors  [phone] .07/22/2012 1428pm

## 2012-07-22 NOTE — PMR Pre-admission (Signed)
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 Date:       Deduct:        Out of Pocket Max:        Life Max:   CIR:        SNF:   Outpatient:       Co-Pay:   Home Health:        Co-Pay:   DME:       Co-Pay:    Tertiary:  BCBS state Health Plan    Policy:  [id number]   Subscriber:  Hazeline Junker  Emergency Contact Information Contact Information    Name Relation Home Work Mobile   Artois Spouse [phone]  [phone]   Jamielynn, Wigley   [phone]     Current Medical History  Patient Admitting Diagnosis: R frontal infarct   History of Present Illness:  A 61 y.o. 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 CT head negative for acute changes. Neurology consulted and recommended full workup for recurrent stroke v/s unmasking of piror deficits due to infection. 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 PT/ST evaluations done today. Patient with decline in memory over past several weeks as well as balance deficits with unsteady gait.  Total: 2 =NIH  Past Medical History  Past Medical History  Diagnosis Date  . Heart attack   . Stroke   . Hypertension   . Diabetes mellitus   . Depression   . Sleep apnea   . Hyperlipidemia   . Bronchitis   . SOB (shortness of breath)   . Arthritis   . Dizziness   . Weakness   . Chronic kidney disease     possible stones - confirm with patient  . Sore throat   . Sinus problem   . Family history of breast cancer   .  Renal cancer     Family History  family history includes Alzheimer's disease in her father; Arthritis in her mother; Diabetes in her brother, father, and sister; Hyperlipidemia in her sister; Hypertension in her brother, father, and mother; Kidney cancer in her father; Kidney disease in her father; Other in her sister; Stroke in her brother; and Thyroid disease in her sister.  Prior Rehab/Hospitalizations: Had cardiac rehab in 2004 after stents placed.  Current Medications  Current facility-administered medications:atorvastatin (LIPITOR) tablet 20 mg, 20 mg, Oral, q1800, Vassie Loll, MD, 20 mg at 07/21/12 1732;  citalopram (CELEXA) tablet 20 mg, 20 mg, Oral, QHS, Vassie Loll, MD, 20 mg at 07/21/12 2306;  clopidogrel (PLAVIX) tablet 75 mg, 75 mg, Oral, QHS, Vassie Loll, MD, 75 mg at 07/21/12 2312;  enoxaparin (LOVENOX) injection 40 mg, 40 mg, Subcutaneous, QHS, Vassie Loll, MD, 40 mg at 07/21/12 2307 gabapentin (NEURONTIN) capsule 100 mg, 100 mg, Oral, TID, Vassie Loll, MD, 100 mg at 07/22/12 1039;  insulin aspart (novoLOG) injection 0-15 Units, 0-15 Units, Subcutaneous, TID WC, Vassie Loll, MD, 3 Units at 07/22/12 1319;  losartan (COZAAR) tablet 100 mg, 100 mg, Oral, QHS, Vassie Loll, MD, 100 mg at 07/21/12 2306;  metoprolol  (LOPRESSOR) tablet 50 mg, 50 mg, Oral, BID, Vassie Loll, MD, 50 mg at 07/22/12 1040 morphine 2 MG/ML injection 2 mg, 2 mg, Intravenous, Q6H PRN, Vassie Loll, MD;  ondansetron [facility]) injection 4 mg, 4 mg, Intravenous, Q6H PRN, Vassie Loll, MD;  senna-docusate (Senokot-S) tablet 1 tablet, 1 tablet, Oral, QHS PRN, Vassie Loll, MD;  traMADol Janean Sark) tablet 50 mg, 50 mg, Oral, Q6H PRN, Vassie Loll, MD;  venlafaxine XR [facility]) 24 hr capsule 150 mg, 150 mg, Oral, QHS, Vassie Loll, MD, 150 mg at 07/21/12 2306 DISCONTD: 0.9 %  sodium chloride infusion, , Intravenous, Continuous, Vassie Loll, MD, Last Rate: 50 mL/hr at 07/20/12 0038  Patients Current Diet: Carb Control  Precautions / Restrictions Precautions Precautions: Fall Precaution Comments: left weakness UE>LE Restrictions Weight Bearing Restrictions: No   Prior Activity Level Limited Community (1-2x/wk): 2-3 X a week  Journalist, newspaper / Equipment Home Assistive Devices/Equipment: None Home Adaptive Equipment: Bedside commode/3-in-1;Walker - rolling;Straight cane;Other (comment)  Prior Functional Level Prior Function Level of Independence: Independent Able to Take Stairs?: Yes Driving: Yes Vocation: On disability  Current Functional Level Cognition  Arousal/Alertness: Awake/alert Overall Cognitive Status: Impaired Overall Cognitive Status: Impaired Current Attention Level: Selective Attention - Other Comments: easily distracted by television or other activity in room during therapy Orientation Level: Oriented X4 Safety/Judgement: Decreased safety judgement for tasks assessed;Decreased awareness of need for assistance Safety/Judgement - Other Comments: Pt educated for need for assist with mobility Awareness of Deficits: Pt feels she could go home and take care of herself "somehow."  Pt did admit by end of session to needing some rehab before returning home. Cognition - Other Comments: Pt may have had  "impulsive" and talkative personality beforehand.  Feel she will be a fall risk due to wanting to get up and do for herself all the time. Attention: Selective Memory: Impaired Memory Impairment: Other (comment) (baseline deficits) Awareness: Impaired Awareness Impairment: Anticipatory impairment Problem Solving: Impaired Problem Solving Impairment: Verbal complex Safety/Judgment: Impaired    Extremity Assessment (includes Sensation/Coordination)  RUE ROM/Strength/Tone: Within functional levels RUE Sensation: WFL - Light Touch RUE Coordination: WFL - gross/fine motor  RLE ROM/Strength/Tone: Within functional levels RLE Sensation: WFL - Light Touch RLE Coordination: WFL - gross/fine motor    ADLs  Eating/Feeding: Performed;Set  up;Other (comment) (occasional min assist since using non dom hand to eat.) Where Assessed - Eating/Feeding: Chair Grooming: Performed;Teeth care;Wash/dry hands;Minimal assistance Where Assessed - Grooming: Supported sitting Upper Body Bathing: Simulated;Minimal assistance Where Assessed - Upper Body Bathing: Supported sitting Lower Body Bathing: Simulated;Maximal assistance Where Assessed - Lower Body Bathing: Supported sit to stand Upper Body Dressing: Performed;Maximal assistance;Other (comment) (hemi dressing technique) Where Assessed - Upper Body Dressing: Supported sitting Lower Body Dressing: Performed;Maximal assistance Where Assessed - Lower Body Dressing: Supported sit to Pharmacist, hospital: Performed;Moderate assistance Toilet Transfer Method: Stand pivot Toilet Transfer Equipment: Bedside commode Toileting - Clothing Manipulation and Hygiene: Performed;Moderate assistance Where Assessed - Toileting Clothing Manipulation and Hygiene: Standing Transfers/Ambulation Related to ADLs: Pt unsteady and unsafe during transfers.  Pt with decreased awareness of her limitations and feels she can transfer w/o assist.   ADL Comments: Pt with great deal of  assist needed for adls due to lack of movement in LUE.    Mobility  Bed Mobility: Supine to Sit Rolling Right: 3: Mod assist Rolling Left: 3: Mod assist;With rail Left Sidelying to Sit: 3: Mod assist;HOB flat;With rails Supine to Sit: 4: Min assist;HOB flat;With rails Sitting - Scoot to Edge of Bed: 5: Supervision Scooting to [facility]: 1: +2 Total assist Scooting to [facility]: Patient Percentage: 30%    Transfers  Transfers: Sit to Stand;Stand to Sit Sit to Stand: 4: Min guard;4: Min assist;From bed;From chair/3-in-1;With armrests Stand to Sit: 4: Min guard    Ambulation / Gait / Stairs / Psychologist, prison and probation services  Ambulation/Gait Ambulation/Gait Assistance: 3: Mod assist Ambulation Distance (Feet): 30 Feet Assistive device: 1 person hand held assist Ambulation/Gait Assistance Details: Pt initially mod assist for mobility due to posterior lean with stance phase on LLE which improved to min assist by end of ambulation. Pt with wide based gait, decreased step length and assist for weight shift and balance with gait Gait Pattern: Step-to pattern;Wide base of support;Trunk flexed;Decreased stance time - left Gait velocity: decreased General Gait Details: Second person utilized for safety and to push IV pole/chair Stairs: No Wheelchair Mobility Wheelchair Mobility: No    Posture / Games developer Sitting - Balance Support: Feet supported;Right upper extremity supported Static Sitting - Level of Assistance: 5: Stand by assistance Static Sitting - Comment/# of Minutes: 2 Static Standing Balance Static Standing - Balance Support: No upper extremity supported Static Standing - Level of Assistance: 4: Min assist Static Standing - Comment/# of Minutes: Stood 3 times for about 2 minutes each time.  performed marching with BLE 5 reps each leg.      Previous Home Environment Living Arrangements: Spouse/significant other Lives With: Spouse Available Help at Discharge:  Family;Available PRN/intermittently Type of Home: House Home Layout: Two level;Bed/bath upstairs Alternate Level Stairs-Rails: Right Alternate Level Stairs-Number of Steps: 13 Home Access: Ramped entrance Bathroom Shower/Tub: Tub/shower unit;Walk-in shower Bathroom Toilet: Standard Home Care Services: No Additional Comments: Pt dgtr has cerebral palsy and equipment left from dgtr who currently lives out of the home.   Discharge Living Setting Plans for Discharge Living Setting: Patient's home;House;Lives with (comment) (Lives with husband.) Type of Home at Discharge: House Discharge Home Layout: Two level;1/2 bath on main level (Does have a bedroom on main level.) Alternate Level Stairs-Number of Steps: Has a stair lift to 2nd level. Discharge Home Access: Ramped entrance Do you have any problems obtaining your medications?: No  Social/Family/Support Systems Patient Roles: Spouse;Parent Contact Information: Torryn Fiske - spouse (h) [phone] (c) [phone];  Shivonne Schwartzman - son [phone] Anticipated Caregiver: self and spouse Ability/Limitations of Caregiver: Husband works 7 am to 7 pm.  Son is local and works.  Dtr in [place] on disability. Caregiver Availability: Evenings only Discharge Plan Discussed with Primary Caregiver: Yes Is Caregiver In Agreement with Plan?: Yes Does Caregiver/Family have Issues with Lodging/Transportation while Pt is in Rehab?: No  Goals/Additional Needs Patient/Family Goal for Rehab: PT/OT mod I goals Expected length of stay: 7-12 days Cultural Considerations: None Dietary Needs: Carb Mod Med Cal thin liquids Equipment Needs: TBD Pt/Family Agrees to Admission and willing to participate: Yes Program Orientation Provided & Reviewed with Pt/Caregiver Including Roles  & Responsibilities: Yes  Patient Condition: This patient's condition remains as documented in the Consult dated 07/21/12, in which the Rehabilitation Physician determined and  documented that the patient's condition is appropriate for intensive rehabilitative care in an inpatient rehabilitation facility.  Preadmission Screen Completed By:  Trish Mage, 07/22/2012 3:19 PM ______________________________________________________________________   Discussed status with Dr. Wynn Banker on 07/22/12 at 1322 and received telephone approval for admission today.  Admission Coordinator:  Trish Mage, time1322/Date09/26/13

## 2012-07-22 NOTE — Progress Notes (Signed)
Rehab admissions - Evaluated for possible admission.  I spoke with patient.  She would like inpatient rehab here at [facility].  Bed has become available late this afternoon and can admit today.  Call me for questions.  #[phone]

## 2012-07-22 NOTE — Progress Notes (Signed)
Physical Therapy Treatment Patient Details Name: [name] MRN: [id number] DOB: [birth year] Today's Date: 07/22/2012 Time: 4098-1191 PT Time Calculation (min): 32 min  PT Assessment / Plan / Recommendation Comments on Treatment Session  Pt admitted with R posterior frontal CVA and progressing with mobility. Pt again educated for importance of not mobilizing without assist and pt stated "I can't the alarms go off" . Pt encouraged to continue use of LUE as well as bil LE HEP. Continue to feel pt would benefit from CIR. Will follow.     Follow Up Recommendations  Inpatient Rehab;Supervision for mobility/OOB    Barriers to Discharge        Equipment Recommendations       Recommendations for Other Services    Frequency     Plan Discharge plan remains appropriate;Frequency remains appropriate    Precautions / Restrictions Precautions Precautions: Fall Precaution Comments: left weakness UE>LE   Pertinent Vitals/Pain Soreness at left shoulder unrated    Mobility  Bed Mobility Bed Mobility: Supine to Sit Supine to Sit: 4: Min assist;HOB flat;With rails Sitting - Scoot to Edge of Bed: 5: Supervision Details for Bed Mobility Assistance: Pt able to pivot lower body to EOB and required min assist to elevate trunk from left side to fully achieve sitting. Pt with cueing and increased time to scoot to EOB Transfers Sit to Stand: 4: Min guard;4: Min assist;From bed;From chair/3-in-1;With armrests Stand to Sit: 4: Min guard Details for Transfer Assistance: Pt stood with min assist from bed with increased time to stabilize due to posterior lean. Pt performed 5 trials sit to stand from chair with Rarmrest without physical assist cueing for safety and sequence Ambulation/Gait Ambulation/Gait Assistance: 3: Mod assist Ambulation Distance (Feet): 30 Feet Assistive device: 1 person hand held assist Ambulation/Gait Assistance Details: Pt initially mod assist for mobility due to posterior lean  with stance phase on LLE which improved to min assist by end of ambulation. Pt with wide based gait, decreased step length and assist for weight shift and balance with gait Gait Pattern: Step-to pattern;Wide base of support;Trunk flexed;Decreased stance time - left Gait velocity: decreased Stairs: No    Exercises General Exercises - Lower Extremity Long Arc Quad: AROM;Left;20 reps;Seated Hip Flexion/Marching: AROM;Left;20 reps;Seated   PT Diagnosis:    PT Problem List:   PT Treatment Interventions:     PT Goals Acute Rehab PT Goals PT Goal: Supine/Side to Sit - Progress: Progressing toward goal PT Goal: Sit to Stand - Progress: Progressing toward goal PT Goal: Stand to Sit - Progress: Progressing toward goal PT Goal: Ambulate - Progress: Progressing toward goal  Visit Information  Last PT Received On: 07/22/12 Assistance Needed: +1    Subjective Data  Subjective: I still can't get that left arm to work   Cognition  Overall Cognitive Status: Impaired Area of Impairment: Safety/judgement Arousal/Alertness: Awake/alert Orientation Level: Appears intact for tasks assessed Behavior During Session: [facility] for tasks performed Current Attention Level: Selective Attention - Other Comments: easily distracted by television or other activity in room during therapy Safety/Judgement: Decreased safety judgement for tasks assessed;Decreased awareness of need for assistance    Balance  Static Sitting Balance Static Sitting - Balance Support: Feet supported;Right upper extremity supported Static Sitting - Level of Assistance: 5: Stand by assistance Static Sitting - Comment/# of Minutes: 2  End of Session PT - End of Session Equipment Utilized During Treatment: Gait belt Activity Tolerance: Patient tolerated treatment well Patient left: in chair;with call bell/phone within reach;with  chair alarm set Nurse Communication: Mobility status   GP     Delorse Lek 07/22/2012, 12:34  PM Delaney Meigs, PT [phone]

## 2012-07-22 NOTE — Progress Notes (Signed)
Pt dc pending pasarr number. CSW submitted clinicals as requested by ncmust. Pt, MD,  and RN aware. .Clinical social worker continuing to follow pt to assist with pt dc plans and further csw needs.   Catha Gosselin, Theresia Majors  [phone] .07/22/2012 1342pm

## 2012-07-22 NOTE — Discharge Summary (Signed)
Physician Discharge Summary  [name]:[id number] DOB: 1951-06-19 DOA: 07/19/2012  PCP: [name], MD  Admit date: 07/19/2012 Discharge date: 07/22/2012  Recommendations for Outpatient Follow-up:   Discharge Diagnoses:  Acute non hemorrhagic posterior right frontal lobe infarct.  HTN (hypertension)  HLD (hyperlipidemia)  Depression  Diabetes mellitus   Discharge Condition: fair, left hemiparesis  Diet recommendation: heart healthy   Filed Weights   07/19/12 2145  Weight: 89.359 kg (197 lb)    History of present illness:    Hospital Course:  1-Acute non hemorrhagic posterior right frontal lobe infarct.  Left-sided weakness: affecting LUE more than LLE; with associated left shoulder pain and left arm numbness. MRI confirmed presence of new CVA  Will continue plavix for now as secondary prevention and follow neurology recommendations. Patient has also been started on neurontin for left shoulder discomfort. Continue Pt/OT/SLP at snf  2-HTN (hypertension): essentially stable, especially for acute setting of stroke; will be permissive with hypertensive state. Continued on home meds. Marland Kitchen  3-HLD (hyperlipidemia): LDL 151. Will increase lipitor to 20; patient advise to follow low fat diet.  4-Depression: continue celexa and effexor; no SI or hallucinations.  5-Diabetes mellitus: will continue holding metformin while inside the hospital; Continue SSI; A1C 6.9 (good control)  6-Hx of MI: no current CP, EKG and telemetry w/o acute ischemia.      Procedures: CT of the brain 07/19/2012 Chronically advanced small vessel ischemia. No acute intracranial abnormality  MRI of the brain 07/20/2012 Acute non hemorrhagic posterior right frontal lobe infarct. 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 Prominent small vessel disease type changes.  MRI Cervical Spine 07/20/2012 Exam is motion degraded particularly axial images. Cervical spondylotic changes  most notable C3-4 through C5-6 with cord deformity as described above.  MRA of the brain 07/20/2012 . Motion degraded exam. Intracranial atherosclerotic type changes as detailed above. Findings are most notable involving the posterior circulation and branch vessels.  2D Echocardiogram EF 45-50% with no source of embolus. Mild LVH. Diffuse hypokinesis.  Carotid Doppler No evidence of hemodynamically significant internal carotid artery stenosis. Vertebral artery flow is antegrade.  CXR 07/20/2012 No acute cardiopulmonary disease. Low lung volumes.  EKG normal sinus rhythm.    Consultations:  Neurology   Discharge Exam: Filed Vitals:   07/22/12 0000 07/22/12 0400 07/22/12 0800 07/22/12 1040  BP: 170/79 168/69 167/94 165/97  Pulse: 63 54 62 103  Temp: 98.6 F (37 C) 98.4 F (36.9 C) 99.1 F (37.3 C)   TempSrc: Oral Oral    Resp: 18 18 17    Height:      Weight:      SpO2: 100% 99% 97%     General: axox3 Cardiovascular: RRR Respiratory: ctab  Discharge Instructions     Medication List     As of 07/22/2012 12:35 PM    TAKE these medications         atorvastatin 20 MG tablet   Commonly known as: LIPITOR   Take 1 tablet (20 mg total) by mouth daily at 6 PM.      citalopram 20 MG tablet   Commonly known as: CELEXA   Take 20 mg by mouth at bedtime.      clopidogrel 75 MG tablet   Commonly known as: PLAVIX   Take 75 mg by mouth at bedtime.      gabapentin 100 MG capsule   Commonly known as: NEURONTIN   Take 1 capsule (100 mg total) by mouth 3 (three) times  daily.      losartan 100 MG tablet   Commonly known as: COZAAR   Take 100 mg by mouth at bedtime.      metFORMIN 500 MG tablet   Commonly known as: GLUCOPHAGE   Take 500 mg by mouth at bedtime.      metoprolol 50 MG tablet   Commonly known as: LOPRESSOR   Take 50 mg by mouth 2 (two) times daily.      senna-docusate 8.6-50 MG per tablet   Commonly known as: Senokot-S   Take 1 tablet by mouth at bedtime as  needed.      traMADol 50 MG tablet   Commonly known as: ULTRAM   Take 1 tablet (50 mg total) by mouth 2 (two) times daily as needed for pain. For pain      venlafaxine XR 150 MG 24 hr capsule   Commonly known as: EFFEXOR-XR   Take 150 mg by mouth at bedtime.           Follow-up Information    Follow up with Gates Rigg, MD. (Stroke Clinic)    Contact information:   [address], [address] [facility] [place] [address] [phone]           The results of significant diagnostics from this hospitalization (including imaging, microbiology, ancillary and laboratory) are listed below for reference.    Significant Diagnostic Studies: Dg Chest 2 View  07/20/2012  *RADIOLOGY REPORT*  Clinical Data: Stroke  CHEST - 2 VIEW  Comparison: Chest radiograph 03/17/2011 and CT chest 11/23/2006  Findings: Stable mild cardiomegaly.  Lung volumes are low bilaterally.  No airspace disease, effusion, or pneumothorax. Multiple healed left-sided rib fractures.  Midline trachea.  On the lateral view, surgical clips are seen in the posterior upper abdomen.  A small-bore catheter tubing projects over the anterior abdomen.  IMPRESSION: No acute cardiopulmonary disease.  Low lung volumes.   Original Report Authenticated By: Britta Mccreedy, M.D.    Ct Head Wo Contrast  07/19/2012  *RADIOLOGY REPORT*  Clinical Data: 61 year old female left side weakness.  History of stroke.  CT HEAD WITHOUT CONTRAST  Technique:  Contiguous axial images were obtained from the base of the skull through the vertex without contrast.  Comparison: Brain MRI and head CT 12/26/2009.  Findings: Visualized paranasal sinuses and mastoids are clear.  No acute osseous abnormality identified.  Visualized orbits and scalp soft tissues are within normal limits.  Chronic advanced small vessel ischemia.  Chronic lacunar infarct in the left thalamus.  Expected evolution of the right brain stem infarct which was acute at  the time of the comparison.  Patchy and confluent cerebral white matter hypodensity.  No midline shift, mass effect, or evidence of mass lesion.  No ventriculomegaly. No acute intracranial hemorrhage identified.  No suspicious intracranial vascular hyperdensity. No evidence of cortically based acute infarction identified.  IMPRESSION: Chronically advanced small vessel ischemia. No acute intracranial abnormality.   Original Report Authenticated By: Harley Hallmark, M.D.    Mr Brain Wo Contrast  07/20/2012  *RADIOLOGY REPORT*  Clinical Data:  Hypertensive diabetic patient with hyperlipidemia with remote pontine infarct presenting with left-sided weakness and difficulty walking.  MRI BRAIN WITHOUT CONTRAST MRA HEAD WITHOUT CONTRAST  Technique: Multiplanar, multiecho pulse sequences of the brain and surrounding structures were obtained according to standard protocol without intravenous contrast.  Angiographic images of the head were obtained using MRA technique without contrast.  Comparison: 07/19/2012 head CT.  12/26/2009 brain MRI and  MRA angiogram.  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  Prominent small vessel disease type changes.  No intracranial hemorrhage.  No intracranial mass lesion detected on this unenhanced exam.  Global atrophy without hydrocephalus.  Minimal paranasal sinus mucosal thickening.  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  Prominent small vessel disease type changes.  This has been made a PRA call report utilizing dashboard call feature.  MRA HEAD  Findings: Motion degraded exam.  Evaluating for aneurysm or grading stenosis is therefore limited.  Anterior circulation without medium or large size vessel significant stenosis or occlusion.  Mild to moderate irregularity  of portions of the middle cerebral artery branches bilaterally.  Fetal type origin of the posterior cerebral arteries.  Diminutive size vertebral arteries and basilar artery with ectasia and moderate to marked tandem stenoses most notable involving the right vertebral artery and mid aspect of the basilar artery.  Poor delineation of the right PICA and both AICAs.  Narrowing of the left PICA.  Superior cerebellar artery distal posterior cerebral artery branch vessel irregularity bilaterally.  IMPRESSION: Motion degraded exam.  Intracranial atherosclerotic type changes as detailed above. Findings are most notable involving the posterior circulation and branch vessels.  MRI CERVICAL SPINE WITHOUT CONTRAST  Technique:  Multiplanar and multiecho pulse sequences of the cervical spine, to include the craniocervical junction and cervicothoracic junction, were obtained without intravenous contrast.  Comparison:   None  Findings:  Motion degraded exam.  Cervical medullary junction unremarkable.  No focal cervical cord signal abnormality.  Visualized paravertebral structures unremarkable.  Both vertebral arteries are patent.  C2-3:  Negative.  C3-4:  Moderate broad-based disc osteophyte complex greater to the right.  Cord flattening greater on the right.  C4-5:  Broad-based disc osteophyte complex greater to the right. Cord flattening greater on the right.  Mild foraminal narrowing greater on the right.  C5-6:  Broad-based disc osteophyte complex greater to the left. Cord flattening greater on the left.  Minimal to mild bilateral foraminal narrowing.  C6-7:  Negative.  C7-T1:  Mild bilateral facet joint degenerative changes.  Mild bilateral foraminal narrowing.  IMPRESSION: Exam is motion degraded particularly axial images.  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   Original Report Authenticated By: Fuller Canada, M.D.    Mr Cervical Spine Wo Contrast  07/20/2012   *RADIOLOGY REPORT*  Clinical Data:  Hypertensive diabetic patient with hyperlipidemia with remote pontine infarct presenting with left-sided weakness and difficulty walking.  MRI BRAIN WITHOUT CONTRAST MRA HEAD WITHOUT CONTRAST  Technique: Multiplanar, multiecho pulse sequences of the brain and surrounding structures were obtained according to standard protocol without intravenous contrast.  Angiographic images of the head were obtained using MRA technique without contrast.  Comparison: 07/19/2012 head CT.  12/26/2009 brain MRI and MRA angiogram.  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  Prominent small vessel disease type changes.  No intracranial hemorrhage.  No intracranial mass lesion detected on this unenhanced exam.  Global atrophy without hydrocephalus.  Minimal paranasal sinus mucosal thickening.  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  Prominent small vessel disease type changes.  This has been  made a PRA call report utilizing dashboard call feature.  MRA HEAD  Findings: Motion degraded exam.  Evaluating for aneurysm or grading stenosis is therefore limited.  Anterior circulation without medium or large size vessel significant stenosis or occlusion.  Mild to moderate irregularity of portions of the middle cerebral artery branches bilaterally.  Fetal type origin of the posterior cerebral arteries.  Diminutive size vertebral arteries and basilar artery with ectasia and moderate to marked tandem stenoses most notable involving the right vertebral artery and mid aspect of the basilar artery.  Poor delineation of the right PICA and both AICAs.  Narrowing of the left PICA.  Superior cerebellar artery distal posterior cerebral artery branch vessel irregularity bilaterally.  IMPRESSION:  Motion degraded exam.  Intracranial atherosclerotic type changes as detailed above. Findings are most notable involving the posterior circulation and branch vessels.  MRI CERVICAL SPINE WITHOUT CONTRAST  Technique:  Multiplanar and multiecho pulse sequences of the cervical spine, to include the craniocervical junction and cervicothoracic junction, were obtained without intravenous contrast.  Comparison:   None  Findings:  Motion degraded exam.  Cervical medullary junction unremarkable.  No focal cervical cord signal abnormality.  Visualized paravertebral structures unremarkable.  Both vertebral arteries are patent.  C2-3:  Negative.  C3-4:  Moderate broad-based disc osteophyte complex greater to the right.  Cord flattening greater on the right.  C4-5:  Broad-based disc osteophyte complex greater to the right. Cord flattening greater on the right.  Mild foraminal narrowing greater on the right.  C5-6:  Broad-based disc osteophyte complex greater to the left. Cord flattening greater on the left.  Minimal to mild bilateral foraminal narrowing.  C6-7:  Negative.  C7-T1:  Mild bilateral facet joint degenerative changes.  Mild bilateral foraminal narrowing.  IMPRESSION: Exam is motion degraded particularly axial images.  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   Original Report Authenticated By: Fuller Canada, M.D.    Mr Maxine Glenn Head/brain Wo Cm  07/20/2012  *RADIOLOGY REPORT*  Clinical Data:  Hypertensive diabetic patient with hyperlipidemia with remote pontine infarct presenting with left-sided weakness and difficulty walking.  MRI BRAIN WITHOUT CONTRAST MRA HEAD WITHOUT CONTRAST  Technique: Multiplanar, multiecho pulse sequences of the brain and surrounding structures were obtained according to standard protocol without intravenous contrast.  Angiographic images of the head were obtained using MRA technique without contrast.  Comparison: 07/19/2012 head CT.   12/26/2009 brain MRI and MRA angiogram.  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  Prominent small vessel disease type changes.  No intracranial hemorrhage.  No intracranial mass lesion detected on this unenhanced exam.  Global atrophy without hydrocephalus.  Minimal paranasal sinus mucosal thickening.  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  Prominent small vessel disease type changes.  This has been made a PRA call report utilizing dashboard call feature.  MRA HEAD  Findings: Motion degraded exam.  Evaluating for aneurysm or grading stenosis is therefore limited.  Anterior circulation without medium or large size vessel significant stenosis or occlusion.  Mild to moderate irregularity of portions of the middle cerebral artery branches bilaterally.  Fetal type origin of the posterior cerebral arteries.  Diminutive size vertebral arteries and basilar artery with ectasia and moderate to marked tandem stenoses most notable involving the right vertebral artery and mid aspect  of the basilar artery.  Poor delineation of the right PICA and both AICAs.  Narrowing of the left PICA.  Superior cerebellar artery distal posterior cerebral artery branch vessel irregularity bilaterally.  IMPRESSION: Motion degraded exam.  Intracranial atherosclerotic type changes as detailed above. Findings are most notable involving the posterior circulation and branch vessels.  MRI CERVICAL SPINE WITHOUT CONTRAST  Technique:  Multiplanar and multiecho pulse sequences of the cervical spine, to include the craniocervical junction and cervicothoracic junction, were obtained without intravenous contrast.  Comparison:   None  Findings:  Motion degraded exam.  Cervical medullary junction unremarkable.  No focal cervical cord  signal abnormality.  Visualized paravertebral structures unremarkable.  Both vertebral arteries are patent.  C2-3:  Negative.  C3-4:  Moderate broad-based disc osteophyte complex greater to the right.  Cord flattening greater on the right.  C4-5:  Broad-based disc osteophyte complex greater to the right. Cord flattening greater on the right.  Mild foraminal narrowing greater on the right.  C5-6:  Broad-based disc osteophyte complex greater to the left. Cord flattening greater on the left.  Minimal to mild bilateral foraminal narrowing.  C6-7:  Negative.  C7-T1:  Mild bilateral facet joint degenerative changes.  Mild bilateral foraminal narrowing.  IMPRESSION: Exam is motion degraded particularly axial images.  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   Original Report Authenticated By: Fuller Canada, M.D.     Microbiology: No results found for this or any previous visit (from the past 240 hour(s)).   Labs: Basic Metabolic Panel:  Lab 07/21/12 1610 07/19/12 1555  NA 137 137  K 4.1 4.5  CL 102 100  CO2 24 29  GLUCOSE 163* 137*  BUN 16 13  CREATININE 0.86 0.91  CALCIUM 9.4 10.2  MG -- --  PHOS -- --   Liver Function Tests:  Lab 07/19/12 1555  AST 20  ALT 15  ALKPHOS 136*  BILITOT 0.3  PROT 8.6*  ALBUMIN 4.0   No results found for this basename: LIPASE:5,AMYLASE:5 in the last 168 hours No results found for this basename: AMMONIA:5 in the last 168 hours CBC:  Lab 07/19/12 1555  WBC 9.5  NEUTROABS 4.8  HGB 13.0  HCT 40.5  MCV 92.0  PLT 341   Cardiac Enzymes:  Lab 07/20/12 1351 07/20/12 0532 07/19/12 2220  CKTOTAL -- -- --  CKMB -- -- --  CKMBINDEX -- -- --  TROPONINI <0.30 <0.30 <0.30   BNP: BNP (last 3 results) No results found for this basename: PROBNP:3 in the last 8760 hours CBG:  Lab 07/22/12 0730 07/21/12 2022 07/21/12 1705 07/21/12 1149 07/21/12 0732  GLUCAP 143* 109* 164* 140* 149*    Time  coordinating discharge: 40 minutes  Signed:  Norma Montemurro  [facility] 07/22/2012, 12:35 PM

## 2012-07-22 NOTE — Progress Notes (Signed)
At noon neuro check patients left grip was not present.  I notified Dr. Lavera Guise no new orders were recieved

## 2012-07-22 NOTE — Clinical Social Work Placement (Addendum)
    Clinical Social Work Department CLINICAL SOCIAL WORK PLACEMENT NOTE 07/22/2012  Patient:  [name], [name]  Account Number:  [id number] Admit date:  07/19/2012  Clinical Social Worker:  Doree Albee  Date/time:  07/21/2012 04:30 PM  Clinical Social Work is seeking post-discharge placement for this patient at the following level of care:   SKILLED NURSING   (*CSW will update this form in Epic as items are completed)   07/21/2012  Patient/family provided with [facility] Department of Clinical Social Work's list of facilities offering this level of care within the geographic area requested by the patient (or if unable, by the patient's family).  07/21/2012  Patient/family informed of their freedom to choose among providers that offer the needed level of care, that participate in Medicare, Medicaid or managed care program needed by the patient, have an available bed and are willing to accept the patient.  07/21/2012  Patient/family informed of [facility]' ownership interest in [facility], as well as of the fact that they are under no obligation to receive care at this facility.  PASARR submitted to EDS on 07/22/2012 PASARR number received from EDS on   FL2 transmitted to all facilities in geographic area requested by pt/family on  07/22/2012 FL2 transmitted to all facilities within larger geographic area on   Patient informed that his/her managed care company has contracts with or will negotiate with  certain facilities, including the following:     Patient/family informed of bed offers received:  07/22/2012 Patient chooses bed at [facility]  Physician recommends and patient chooses bed at    Patient to be transferred to [facility]  on  07/22/2012  Pending pasarr number.  Patient to be transferred to facility by ptar  The following physician request were entered in Epic:   Additional Comments: CSW submitted clinicals to pasarr for pasarr number, pending  manual review from nurse.

## 2012-07-23 ENCOUNTER — Inpatient Hospital Stay (HOSPITAL_COMMUNITY): Payer: BC Managed Care – PPO | Admitting: *Deleted

## 2012-07-23 ENCOUNTER — Inpatient Hospital Stay (HOSPITAL_COMMUNITY): Payer: BC Managed Care – PPO | Admitting: Occupational Therapy

## 2012-07-23 DIAGNOSIS — G8110 Spastic hemiplegia affecting unspecified side: Secondary | ICD-10-CM

## 2012-07-23 DIAGNOSIS — I6330 Cerebral infarction due to thrombosis of unspecified cerebral artery: Secondary | ICD-10-CM

## 2012-07-23 DIAGNOSIS — Z5189 Encounter for other specified aftercare: Secondary | ICD-10-CM

## 2012-07-23 LAB — CBC WITH DIFFERENTIAL/PLATELET
Basophils Relative: 1 % (ref 0–1)
Eosinophils Absolute: 0.5 10*3/uL (ref 0.0–0.7)
Eosinophils Relative: 4 % (ref 0–5)
HCT: 37.2 % (ref 36.0–46.0)
Hemoglobin: 12.4 g/dL (ref 12.0–15.0)
Lymphs Abs: 5.1 10*3/uL — ABNORMAL HIGH (ref 0.7–4.0)
MCH: 30.2 pg (ref 26.0–34.0)
MCHC: 33.3 g/dL (ref 30.0–36.0)
MCV: 90.7 fL (ref 78.0–100.0)
Monocytes Absolute: 1.2 10*3/uL — ABNORMAL HIGH (ref 0.1–1.0)
Monocytes Relative: 11 % (ref 3–12)
RBC: 4.1 MIL/uL (ref 3.87–5.11)

## 2012-07-23 LAB — COMPREHENSIVE METABOLIC PANEL
Albumin: 3.3 g/dL — ABNORMAL LOW (ref 3.5–5.2)
Alkaline Phosphatase: 123 U/L — ABNORMAL HIGH (ref 39–117)
BUN: 18 mg/dL (ref 6–23)
Creatinine, Ser: 0.97 mg/dL (ref 0.50–1.10)
GFR calc Af Amer: 73 mL/min — ABNORMAL LOW (ref >90)
Glucose, Bld: 127 mg/dL — ABNORMAL HIGH (ref 70–99)
Total Protein: 7.2 g/dL (ref 6.0–8.3)

## 2012-07-23 LAB — GLUCOSE, CAPILLARY
Glucose-Capillary: 137 mg/dL — ABNORMAL HIGH (ref 70–99)
Glucose-Capillary: 143 mg/dL — ABNORMAL HIGH (ref 70–99)

## 2012-07-23 NOTE — Plan of Care (Signed)
Overall Plan of Care [facility]) Patient Details Name: [name] MRN: [id number] DOB: October 03, 1951  Diagnosis:  Rehab for L HP  Primary Diagnosis:    <principal problem not specified> Co-morbidities: HTN, neurogenic bladder  Functional Problem List  Patient demonstrates impairments in the following areas: Balance, Cognition, Medication Management and Pain  Basic ADL's: eating, grooming, bathing, dressing and toileting Advanced ADL's: simple meal preparation  Transfers:  bed mobility, bed to chair, toilet, tub/shower, car and furniture Locomotion:  ambulation, wheelchair mobility and stairs  Additional Impairments:  Functional use of upper extremity  Anticipated Outcomes Item Anticipated Outcome  Eating/Swallowing    Basic self-care    Tolieting    Bowel/Bladder   remain continent of bowel and bladder  Transfers    Locomotion    Communication    Cognition    Pain  Free of pain or less that 2  Safety/Judgment   remain free of fall/ follow safety planx  Other     Therapy Plan:         Team Interventions: Item RN PT OT SLP SW TR Other  Self Care/Advanced ADL Retraining         Neuromuscular Re-Education         Therapeutic Activities         UE/LE Strength Training/ROM         UE/LE Coordination Activities         Visual/Perceptual Remediation/Compensation         DME/Adaptive Equipment Instruction         Therapeutic Exercise         Balance/Vestibular Training         Patient/Family Education x        Cognitive Remediation/Compensation         Functional Mobility Training         Ambulation/Gait Dispensing optician Facilitation         Bladder Management x        Bowel Management x        Disease Management/Prevention x        Pain Management x         Medication Management x        Skin Care/Wound Management x        Splinting/Orthotics         Discharge Planning x        Psychosocial Support x                           Team Discharge Planning: Destination:  Home Projected Follow-up:  PT, OT, SLP and Outpatient Projected Equipment Needs:  Biomedical engineer involved in discharge planning:  Yes  MD ELOS: 2 wks Medical Rehab Prognosis:  Excellent Assessment: 61 yo female admitted with CVA now requiring CIR level PT/OT, 24/7 rehab RN and MD

## 2012-07-23 NOTE — Progress Notes (Signed)
Patient ID: [name], female   DOB: [birth year], 61 y.o.   MRN: [id number]  Subjective/Complaints: Frequent and urgent urination no burning  Objective: Vital Signs: Blood pressure 178/91, pulse 59, temperature 98.3 F (36.8 C), temperature source Oral, resp. rate 18, SpO2 96.00%. No results found. Results for orders placed during the hospital encounter of 07/22/12 (from the past 72 hour(s))  GLUCOSE, CAPILLARY     Status: Abnormal   Collection Time   07/22/12  8:49 PM      Component Value Range Comment   Glucose-Capillary 144 (*) 70 - 99 mg/dL    Comment 1 Notify RN     CBC WITH DIFFERENTIAL     Status: Abnormal   Collection Time   07/23/12  5:10 AM      Component Value Range Comment   WBC 11.1 (*) 4.0 - 10.5 K/uL    RBC 4.10  3.87 - 5.11 MIL/uL    Hemoglobin 12.4  12.0 - 15.0 g/dL    HCT 78.2  95.6 - 21.3 %    MCV 90.7  78.0 - 100.0 fL    MCH 30.2  26.0 - 34.0 pg    MCHC 33.3  30.0 - 36.0 g/dL    RDW 08.6  57.8 - 46.9 %    Platelets 332  150 - 400 K/uL    Neutrophils Relative 39 (*) 43 - 77 %    Neutro Abs 4.3  1.7 - 7.7 K/uL    Lymphocytes Relative 46  12 - 46 %    Lymphs Abs 5.1 (*) 0.7 - 4.0 K/uL    Monocytes Relative 11  3 - 12 %    Monocytes Absolute 1.2 (*) 0.1 - 1.0 K/uL    Eosinophils Relative 4  0 - 5 %    Eosinophils Absolute 0.5  0.0 - 0.7 K/uL    Basophils Relative 1  0 - 1 %    Basophils Absolute 0.1  0.0 - 0.1 K/uL   COMPREHENSIVE METABOLIC PANEL     Status: Abnormal   Collection Time   07/23/12  5:10 AM      Component Value Range Comment   Sodium 137  135 - 145 mEq/L    Potassium 4.3  3.5 - 5.1 mEq/L    Chloride 102  96 - 112 mEq/L    CO2 27  19 - 32 mEq/L    Glucose, Bld 127 (*) 70 - 99 mg/dL    BUN 18  6 - 23 mg/dL    Creatinine, Ser 6.29  0.50 - 1.10 mg/dL    Calcium 9.4  8.4 - 52.8 mg/dL    Total Protein 7.2  6.0 - 8.3 g/dL    Albumin 3.3 (*) 3.5 - 5.2 g/dL    AST 31  0 - 37 U/L    ALT 21  0 - 35 U/L    Alkaline Phosphatase 123 (*) 39 -  117 U/L    Total Bilirubin 0.2 (*) 0.3 - 1.2 mg/dL    GFR calc non Af Amer 63 (*) >90 mL/min    GFR calc Af Amer 73 (*) >90 mL/min   GLUCOSE, CAPILLARY     Status: Abnormal   Collection Time   07/23/12  7:43 AM      Component Value Range Comment   Glucose-Capillary 132 (*) 70 - 99 mg/dL      HEENT: normal Cardio: RRR Resp: CTA B/L GI: BS positive Extremity:  No Edema Skin:   Intact Neuro: Alert/Oriented,  Cranial Nerve II-XII normal, Normal Sensory, Abnormal Motor R side 5/5, L side 1/5 distal, 2-/5 prox UE, 3/5 LLE and Abnormal FMC Ataxic/ dec FMC Musc/Skel:  Normal   Assessment/Plan: 1. 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 Physiatrist is providing close team supervision and 24 hour management of active medical problems listed below. Physiatrist and rehab team continue to assess barriers to discharge/monitor patient progress toward functional and medical goals. FIM:                                  Medical Problem List and Plan:  1. DVT Prophylaxis/Anticoagulation: Pharmaceutical: Lovenox  2. Pain Management: started on neurontin for left shoulder pain. Add ice prn. Will monitor.  3. Mood: continue celexa and effexor daily. LCSW to follow for formal evaluation.  4. Neuropsych: This patient is capable of making decisions on his/her own behalf.  5. CAD: On plavix and lopressor. Lipitor added at this admission.  6. DM type 2: monitor with ac/hs CBG checks. Resume metformin bid as CT/MRI done without contrast.  7. HTN; monitor with bid checks. Continue cozaar and lopressor   LOS (Days) 1 A FACE TO FACE EVALUATION WAS PERFORMED  Moe Brier E 07/23/2012, 8:08 AM

## 2012-07-23 NOTE — Progress Notes (Signed)
Patient was admitted at 730 pm  to room 4036. Patient is comfortable and resting. Admission vital sign:Temp:98.2, Pulse: 96, BP 161/99, Resp: 18

## 2012-07-23 NOTE — Progress Notes (Signed)
Occupational Therapy Session Note  Patient Details  Name: [name] MRN: [id number] Date of Birth: [birth year]  Today's Date: 07/23/2012 Time: 1100-1135 Time Calculation (min): 35 min  Short Term Goals: Week 1:  OT Short Term Goal 1 (Week 1): Pt will ambulate to toilet with min assist with RW. OT Short Term Goal 2 (Week 1): Pt will don shirt with set up. OT Short Term Goal 3 (Week 1): Pt will don pants with min assist. OT Short Term Goal 4 (Week 1): Pt will stand at sink to brush teeth with supervision. OT Short Term Goal 5 (Week 1): Pt will transfer to shower chair with min assist.  Skilled Therapeutic Interventions/Progress Updates:   Worked on LUE neuromuscular re-education.  Placed pt in sitting on mat.  Focused on closed chain weightbearing over the LUE while attempting to reach across with the RUE to maintain contact with therapist's hand.  Emphasized pt to focus on maintaining elbow extension in the left arm while perform reaching.  Progressed to using the LUE in the same position but to work on lateral shifts for reciprical scooting.  Initially pt needed mod facilitation to use the UE and perform hip hike for scooting but progressed to being able to maintain LUE stabilization and reciprically scoot the LUE.  Provided lap tray for the LUE for positioning.  Therapy Documentation Precautions:  Precautions Precautions: Fall Precaution Comments: left weakness UE>LE Restrictions Weight Bearing Restrictions: No  Pain: Pain Assessment Pain Assessment: No/denies pain ADL: See FIM for current functional status  Therapy/Group: Individual Therapy  Shilynn Hoch OTR/L 07/23/2012, 12:13 PM

## 2012-07-23 NOTE — Progress Notes (Signed)
Social Work Assessment and Plan Social Work Assessment and Plan  Patient Details  Name: [name] MRN: [id number] Date of Birth: [birth year]  Today's Date: 07/23/2012  Problem List:  Patient Active Problem List  Diagnosis  . AMI  . ALLERGIC RHINITIS  . BRONCHITIS  . HYPERSOMNIA, ASSOCIATED WITH SLEEP APNEA  . MUSCULOSKELETAL PAIN  . NEPHRECTOMY, HX OF  . Other Acquired Absence of Organ  . Left-sided weakness  . HTN (hypertension)  . HLD (hyperlipidemia)  . Depression  . Diabetes mellitus  . Stroke  . CVA (cerebral infarction)   Past Medical History:  Past Medical History  Diagnosis Date  . Heart attack   . Stroke   . Hypertension   . Diabetes mellitus   . Depression   . Sleep apnea   . Hyperlipidemia   . Bronchitis   . SOB (shortness of breath)   . Arthritis   . Dizziness   . Weakness   . Chronic kidney disease     possible stones - confirm with patient  . Sore throat   . Sinus problem   . Family history of breast cancer   . Renal cancer    Past Surgical History:  Past Surgical History  Procedure Date  . Spleen removal 2001  . Laparoscopic gastric banding 05/09/2008  . Abdominal hysterectomy 2002  . Cholecystectomy 1999  . Nephrectomy 2001    due to cancer   Social History:  reports that she has never smoked. She does not have any smokeless tobacco history on file. She reports that she does not drink alcohol or use illicit drugs.  Family / Support Systems Marital Status: Married Patient Roles: Spouse;Parent Spouse/Significant Other: [phone]  [phone]-cell Children: Kendrick-son  [phone]-cell Other Supports: Friends Anticipated Caregiver: Husband and Patient Ability/Limitations of Caregiver: HUsband works 7-7PM each week changes.  Son there until 3:30 PM Caregiver Availability: Evenings only Family Dynamics: Close knit family, daughter recently moved into group home, been there one month.  Husband and son are supportive and  will assist.  Social History Preferred language: English Religion: Baptist Cultural Background: No issues Education: High School Read: Yes Write: Yes Employment Status: Disabled Date Retired/Disabled/Unemployed: 2004 Fish farm manager Issues: No issues Guardian/Conservator: None-according to MD pt is capable of making her own decisions   Abuse/Neglect Physical Abuse: Denies Verbal Abuse: Denies Sexual Abuse: Denies Exploitation of patient/patient's resources: Denies Self-Neglect: Denies  Emotional Status Pt's affect, behavior adn adjustment status: Pt can explain her stroke and deficits.  She had one in 2011 and she recovered and she is hopeful she will again.  She will work hard while here. Recent Psychosocial Issues: Other medical issues Pyschiatric History: History of depression-not taking anything prior to admission, but feels she may need someoent to help her through this. Deferred depression screen during lunch and she wanted to eat.  Will come back to perform test. Substance Abuse History: No issues  Patient / Family Perceptions, Expectations & Goals Pt/Family understanding of illness & functional limitations: Pt is able to expalin her deficits and stroke.  She reprots she had a stroke in 2011 and recovered from this, she hopes she is able to do this agian. Premorbid pt/family roles/activities: Wife, Mother, Retiree, Church member, etc Anticipated changes in roles/activities/participation: Resume Pt/family expectations/goals: Pt states: " I want to be able to take car eof myself, like I always have."  Besides: " Their is no one there all of the time at home."  [employer]: None Premorbid Home  Care/DME Agencies: None Transportation available at discharge: Family Resource referrals recommended: Support group (specify) (CVA Support group)  Discharge Planning Living Arrangements: Spouse/significant other;Children Support Systems:  Spouse/significant other;Children;Other relatives;Friends/neighbors;Church/faith community Type of Residence: Private residence Insurance Resources: Administrator (specify) Herbalist) Financial Resources: SSD;Family Support Financial Screen Referred: No Living Expenses: Lives with family Money Management: Spouse;Patient Do you have any problems obtaining your medications?: No Home Management: Patient Patient/Family Preliminary Plans: Return home with assistance from husband when he is there, son can be there until 3:30 each day.  Pt needs to be mod/i level by discharge. Social Work Anticipated Follow Up Needs: HH/OP;Support Group  Clinical Impression Pleasant female who is motivated and willing to do what it takes to get better.  She has ben through this before and is hopeful she will do well again. Will need to be mod/i level since only has assist in the evenings.  Lucy Chris 07/23/2012, 1:16 PM

## 2012-07-23 NOTE — Evaluation (Addendum)
Physical Therapy Assessment and Plan  Patient Details  Name: [name] MRN: [id number] Date of Birth: 19-Nov-1950  PT Diagnosis: Abnormal posture, Abnormality of gait, Coordination disorder, Hemiparesis dominant and Impaired cognition Rehab Potential: Good ELOS: 2.5 weeks   Today's Date: 07/23/2012 Time: 1000-1100 Time Calculation (min): 60 min  Problem List:  Patient Active Problem List  Diagnosis  . AMI  . ALLERGIC RHINITIS  . BRONCHITIS  . HYPERSOMNIA, ASSOCIATED WITH SLEEP APNEA  . MUSCULOSKELETAL PAIN  . NEPHRECTOMY, HX OF  . Other Acquired Absence of Organ  . Left-sided weakness  . HTN (hypertension)  . HLD (hyperlipidemia)  . Depression  . Diabetes mellitus  . Stroke  . CVA (cerebral infarction)    Past Medical History:  Past Medical History  Diagnosis Date  . Heart attack   . Stroke   . Hypertension   . Diabetes mellitus   . Depression   . Sleep apnea   . Hyperlipidemia   . Bronchitis   . SOB (shortness of breath)   . Arthritis   . Dizziness   . Weakness   . Chronic kidney disease     possible stones - confirm with patient  . Sore throat   . Sinus problem   . Family history of breast cancer   . Renal cancer    Past Surgical History:  Past Surgical History  Procedure Date  . Spleen removal 2001  . Laparoscopic gastric banding 05/09/2008  . Abdominal hysterectomy 2002  . Cholecystectomy 1999  . Nephrectomy 2001    due to cancer    Assessment & Plan Clinical Impression: [name] is a 61 y.o. 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 CT head negative for acute changes. Neurology consulted and recommended full workup for recurrent stroke v/s unmasking of piror deficits due to infection. 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 2D echo done with EF 45-50% and no embolus, diffuse hypokinesis. carodid dopplers without ICA stenosis. Husband reports that patient with decline in memory over past several weeks as well as balance deficits with unsteady gait. Dr. 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 Patient continues with left hemiparesis with wide based unsteady gait. Was started on neurontin for left shoulder pain. MD, therapy team recommending CIR. Patient transferred to CIR on 07/22/2012 .   Patient currently requires mod with mobility and max assist with gait secondary to impaired timing and sequencing, unbalanced muscle activation and decreased coordination.  Prior to hospitalization, patient was independent with mobility and lived with Spouse in a House home.  Home access is  Ramped entrance.  Patient will benefit from skilled PT intervention to maximize safe functional mobility, minimize fall risk and decrease caregiver burden for planned discharge home with intermittent assist.  Anticipate patient will benefit from follow up [facility] at discharge.  PT - End of Session Activity Tolerance: Tolerates 10 - 20 min activity with multiple rests Endurance Deficit: Yes Endurance Deficit Description: Pt needing seated rest breaks following each tasks PT Assessment Rehab Potential: Good Barriers to Discharge: Decreased caregiver support PT Plan PT Frequency: 1-2 X/day, 60-90 minutes Estimated Length of Stay: 2.5 weeks PT Treatment/Interventions: Ambulation/gait training;Cognitive remediation/compensation;Balance/vestibular training;Community reintegration;Discharge planning;Disease management/prevention;DME/adaptive equipment instruction;Functional electrical stimulation;Functional mobility training;Neuromuscular re-education;Pain management;Patient/family education;Psychosocial support;Splinting/orthotics;Stair training;Therapeutic  Activities;Therapeutic Exercise;UE/LE Strength  taining/ROM;UE/LE Coordination activities;Visual/perceptual remediation/compensation;Wheelchair propulsion/positioning PT Recommendation Recommendations for Other Services: Neuropsych consult Follow Up Recommendations: Home health PT;24 hour supervision/assistance Equipment Recommended: None recommended by PT  PT Evaluation Precautions/Restrictions Precautions Precautions: Fall Precaution Comments: left weakness UE>LE Restrictions Weight Bearing Restrictions: No Pain Pain Assessment Pain Assessment: No/denies pain Home Living/Prior Functioning Home Living Lives With: Spouse Available Help at Discharge: Family;Available PRN/intermittently Type of Home: House Home Access: Ramped entrance Home Layout: Two level;Bed/bath upstairs;Able to live on main level with bedroom/bathroom Alternate Level Stairs-Number of Steps: 13 Alternate Level Stairs-Rails: Right Bathroom Shower/Tub: Tub/shower unit;Walk-in shower Bathroom Toilet: Standard Home Adaptive Equipment: Bedside commode/3-in-1;Walker - rolling;Straight cane;Other (comment) Additional Comments: Pt dgtr has cerebral palsy and equipment left from dgtr who currently lives out of the home.  Prior Function Level of Independence: Independent with gait;Independent with transfers;Independent with basic ADLs Able to Take Stairs?: Yes Driving: Yes Vocation: On disability Vision/Perception  Vision - History Baseline Vision: Wears glasses only for reading Vision - Assessment Vision Assessment: Vision not tested Perception Perception: Within Functional Limits  Cognition Orientation Level: Oriented X4 Memory: Impaired Memory Impairment: Decreased short term memory Sensation Sensation Light Touch: Appears Intact Coordination Gross Motor Movements are Fluid and Coordinated: No Fine Motor Movements are Fluid and Coordinated: No Coordination and Movement Description: Decreased timing Heel  Shin Test: Impaired L Motor  Motor Motor: Hemiplegia;Abnormal postural alignment and control Motor - Skilled Clinical Observations: Decreased coordination with functional tasks  Mobility Bed Mobility Bed Mobility: Rolling Left;Left Sidelying to Sit;Sit to Supine Rolling Right: 3: Mod assist Rolling Right Details: Tactile cues for initiation;Verbal cues for sequencing;Verbal cues for technique Rolling Right Details (indicate cue type and reason): assist with LLE and carrying LUE Left Sidelying to Sit: 3: Mod assist Left Sidelying to Sit Details: Manual facilitation for weight shifting;Manual facilitation for placement;Verbal cues for sequencing;Verbal cues for technique Left Sidelying to Sit Details (indicate cue type and reason): Assist for trunk lifting Sit to Supine: 4: Min assist Sit to Supine - Details (indicate cue type and reason): Assist for LLE Locomotion  Ambulation Ambulation: Yes Ambulation/Gait Assistance: 2: Max assist Ambulation Distance (Feet): 20 Feet Assistive device: 1 person hand held assist Ambulation/Gait Assistance Details: Tactile cues for weight shifting;Verbal cues for precautions/safety;Verbal cues for gait pattern Ambulation/Gait Assistance Details: Pt tends to walk withshuffled gait, unable to pass each foot for long step length, difficutly clearing LLE withlimited R weight-shift Stairs / Additional Locomotion Stairs: Yes Stairs Assistance: 3: Mod assist Stairs Assistance Details: Verbal cues for sequencing;Verbal cues for technique;Verbal cues for precautions/safety;Verbal cues for gait pattern;Verbal cues for safe use of DME/AE Stair Management Technique: Two rails;Step to pattern;Forwards Number of Stairs: 5  Height of Stairs: 6  Wheelchair Mobility Wheelchair Mobility: No  Trunk/Postural Assessment  Cervical Assessment Cervical Assessment: Within Functional Limits Thoracic Assessment Thoracic Assessment: Within Functional Limits Lumbar  Assessment Lumbar Assessment: Within Functional Limits Postural Control Postural Control: Deficits on evaluation Trunk Control: Pt with posterior lean in sitting and standing, needing mod A to recover Righting Reactions: Slowed  Balance Balance Balance Assessed: Yes Static Sitting Balance Static Sitting - Balance Support: Feet supported;Right upper extremity supported Static Sitting - Level of Assistance: 5: Stand by assistance Static Standing Balance Static Standing - Balance Support: No upper extremity supported Static Standing - Level of Assistance: 3: Mod assist Static Standing - Comment/# of Minutes: Pt with posterior lean, unable to self-correct Dynamic Standing Balance Dynamic Standing - Balance Support: No upper extremity supported Dynamic Standing - Level of  Assistance: 2: Max assist Extremity Assessment  RUE Assessment RUE Assessment: Within Functional Limits LUE Assessment LUE Assessment: Exceptions to [facility] LUE Strength LUE Overall Strength Comments: Some moveemnt returning at shoulder and elbow, none at hand/grip RLE Assessment RLE Assessment: Within Functional Limits LLE Assessment LLE Assessment: Exceptions to [facility] LLE Strength LLE Overall Strength Comments: 3+/5 throughout, but decreased muscular endurance  See FIM for current functional status Refer to Care Plan for Long Term Goals  Tx initiated this session with gait training, using RW with L hand grip. Pt able to gait 1x30' with decreased step length, foot clearance, R weigh-shift, and narrow BOS. Pt able to adjust gait quality with cues momentarily, but unable to maintain. Also performed static sitting and standing balance to decrease posterior lean. Pt continues to require up to Mod A in standing and S in sitting. Pt performed high knee marching with bil LE in sitting x20 each.    Recommendations for other services: Neuropsych  Discharge Criteria: Patient will be discharged from PT if patient refuses  treatment 3 consecutive times without medical reason, if treatment goals not met, if there is a change in medical status, if patient makes no progress towards goals or if patient is discharged from hospital.  The above assessment, treatment plan, treatment alternatives and goals were discussed and mutually agreed upon: by patient  Virl Cagey, PT 07/23/2012, 11:36 AM

## 2012-07-23 NOTE — Care Management Note (Signed)
[facility] Individual Statement of Services  Patient Name:  [name]  Date:  07/23/2012  Welcome to the [facility].  Our goal is to provide you with an individualized program based on your diagnosis and situation, designed to meet your specific needs.  With this comprehensive rehabilitation program, you will be expected to participate in at least 3 hours of rehabilitation therapies Monday-Friday, with modified therapy programming on the weekends.  Your rehabilitation program will include the following services:  Physical Therapy (PT), Occupational Therapy (OT), Speech Therapy (ST), 24 hour per day rehabilitation nursing, Therapeutic Recreaction (TR), Neuropsychology, Case Management (RN and Social Worker), Rehabilitation Medicine, Nutrition Services and Pharmacy Services  Weekly team conferences will be held on Wednesday to discuss your progress.  Your RN Case Designer, television/film set will talk with you frequently to get your input and to update you on team discussions.  Team conferences with you and your family in attendance may also be held.  Expected length of stay: 2.5 weeks Overall anticipated outcome: supervision/mod/i level  Depending on your progress and recovery, your program may change.  Your RN Case Estate agent will coordinate services and will keep you informed of any changes.  Your RN Sports coach and SW names and contact numbers are listed  below.  The following services may also be recommended but are not provided by the [facility]:   Driving Evaluations  Home Health Rehabiltiation Services  Outpatient Rehabilitatation [facility]  Vocational Rehabilitation   Arrangements will be made to provide these services after discharge if needed.  Arrangements include referral to agencies that provide these services.  Your insurance has been verified to be:  Medicare, BCBS & BCBS out of state Your  primary doctor is:  Dr. Fleet Contras  Pertinent information will be shared with your doctor and your insurance company.    Social Worker:  Dossie Der, [place] [phone]  Information discussed with and copy given to patient by: Lucy Chris, 07/23/2012, 8:52 AM

## 2012-07-23 NOTE — H&P (Signed)
Physical Medicine and Rehabilitation Admission H&P    Chief Complaint  Patient presents with  . Left sided weakness  : HPI: [name] is a 61 y.o. 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 CT head negative for acute changes. Neurology consulted and recommended full workup for recurrent stroke v/s unmasking of piror deficits due to infection. 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 2D echo done with EF 45-50% and no embolus, diffuse hypokinesis. carodid dopplers without ICA stenosis. Husband reports that patient with decline in memory over past several weeks as well as balance deficits with unsteady gait. Dr. 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 Patient continues with left hemiparesis with wide based unsteady gait. Was started on neurontin for left shoulder pain. MD, therapy team recommending CIR.  Review of Systems  HENT: Negative for hearing loss.   Eyes: Negative for blurred vision and double vision.  Respiratory: Negative for shortness of breath.   Cardiovascular: Positive for chest pain. Negative for palpitations.  Gastrointestinal: Negative for heartburn, nausea and abdominal pain.  Genitourinary: Negative for dysuria.  Musculoskeletal: Positive for myalgias and joint pain (left shoulder.  Chronic  left groin/knee pain.).  Neurological: Positive for focal weakness. Negative for headaches.  Psychiatric/Behavioral: The patient is not nervous/anxious and does not have insomnia.    Past Medical History  Diagnosis Date  . Heart attack   . Stroke   . Hypertension   . Diabetes mellitus   . Depression   . Sleep apnea   . Hyperlipidemia   . Bronchitis   . SOB (shortness of breath)   .  Arthritis   . Dizziness   . Weakness   . Chronic kidney disease     possible stones - confirm with patient  . Sore throat   . Sinus problem   . Family history of breast cancer   . Renal cancer    Past Surgical History  Procedure Date  . Spleen removal 2001  . Laparoscopic gastric banding 05/09/2008  . Abdominal hysterectomy 2002  . Cholecystectomy 1999  . Nephrectomy 2001    due to cancer   Family History  Problem Relation Age of Onset  . Diabetes Father   . Alzheimer's disease Father   . Kidney cancer Father   . Hypertension Father   . Kidney disease Father     kidney removal  . Hypertension Mother   . Arthritis Mother   . Stroke Brother   . Diabetes Brother   . Hypertension Brother   . Diabetes Sister   . Thyroid disease Sister   . Hyperlipidemia Sister   . Other Sister     thyroidectomy   Social History: Married. Disabled due to multiple medical issues. She reports that she has never smoked. She does not have any smokeless tobacco history on file. She reports that she does not use illicit drugs. Uses alcohol on socially- one drink every 3months. Sedentary PTA- "spends a lot of time in bed" per husband.  Allergies  Allergen Reactions  . Iohexol      Code: HIVES, Desc: Pt states in 2005 w/ a heart cath, she broke out in large hives and a rash.  She was given 50mg  benadryl, po.  She tolerated the procedure well w/o complication.  Thanks., Onset Date:  [id number]     Scheduled Meds:    . atorvastatin  20 mg Oral q1800  . citalopram  20 mg Oral QHS  . clopidogrel  75 mg Oral QHS  . enoxaparin  40 mg Subcutaneous Q24H  . gabapentin  100 mg Oral TID  . insulin aspart  0-15 Units Subcutaneous TID WC  . losartan  100 mg Oral QHS  . metFORMIN  500 mg Oral BID WC  . metoprolol  50 mg Oral BID  . venlafaxine XR  150 mg Oral QHS    Medications Prior to Admission  Medication Sig Dispense Refill  . atorvastatin (LIPITOR) 20 MG tablet Take 1 tablet (20 mg total) by  mouth daily at 6 PM.      . citalopram (CELEXA) 20 MG tablet Take 20 mg by mouth at bedtime.      . clopidogrel (PLAVIX) 75 MG tablet Take 75 mg by mouth at bedtime.      . gabapentin (NEURONTIN) 100 MG capsule Take 1 capsule (100 mg total) by mouth 3 (three) times daily.      Marland Kitchen losartan (COZAAR) 100 MG tablet Take 100 mg by mouth at bedtime.      . metFORMIN (GLUCOPHAGE) 500 MG tablet Take 500 mg by mouth at bedtime.      . metoprolol (LOPRESSOR) 50 MG tablet Take 50 mg by mouth 2 (two) times daily.      Marland Kitchen senna-docusate (SENOKOT-S) 8.6-50 MG per tablet Take 1 tablet by mouth at bedtime as needed.      . traMADol (ULTRAM) 50 MG tablet Take 1 tablet (50 mg total) by mouth 2 (two) times daily as needed for pain. For pain  10 tablet  0  . venlafaxine XR (EFFEXOR-XR) 150 MG 24 hr capsule Take 150 mg by mouth at bedtime.        Home:     Functional History:    Functional Status:  Mobility:          ADL:    Cognition: Cognition Orientation Level: Oriented X4     Blood pressure 178/91, pulse 59, temperature 98.3 F (36.8 C), temperature source Oral, resp. rate 18, SpO2 96.00%. Physical Exam  Nursing note and vitals reviewed. Constitutional: She is oriented to person, place, and time. She appears well-developed and well-nourished.       obese  HENT:  Head: Normocephalic and atraumatic.  Eyes: Pupils are equal, round, and reactive to light.  Neck: Normal range of motion.  Cardiovascular: Normal rate and regular rhythm.   Pulmonary/Chest: Effort normal and breath sounds normal.  Abdominal: Soft. Bowel sounds are normal.  Musculoskeletal: She exhibits tenderness (left shoulder with ROM).  Neurological: She is alert and oriented to person, place, and time.       Speech clear. Follows basic commands without difficulty. Needs occasional redirection for 2 step command. Left hemiparesis UE>LE.  Skin: Skin is warm and dry.  Motor:  2- L delt bi, 1/5 tri, 1/5 L finger flexors, 0/5 L  FE, 3/5 L HF, KE R side 5/5  Sensory intact  Results for orders placed during the hospital encounter of 07/22/12 (from the past 48 hour(s))  GLUCOSE, CAPILLARY     Status: Abnormal   Collection Time   07/22/12  8:49 PM      Component Value Range Comment   Glucose-Capillary 144 (*) 70 - 99 mg/dL    Comment 1 Notify RN     CBC WITH DIFFERENTIAL     Status: Abnormal  Collection Time   07/23/12  5:10 AM      Component Value Range Comment   WBC 11.1 (*) 4.0 - 10.5 K/uL    RBC 4.10  3.87 - 5.11 MIL/uL    Hemoglobin 12.4  12.0 - 15.0 g/dL    HCT 40.9  81.1 - 91.4 %    MCV 90.7  78.0 - 100.0 fL    MCH 30.2  26.0 - 34.0 pg    MCHC 33.3  30.0 - 36.0 g/dL    RDW 78.2  95.6 - 21.3 %    Platelets 332  150 - 400 K/uL    Neutrophils Relative 39 (*) 43 - 77 %    Neutro Abs 4.3  1.7 - 7.7 K/uL    Lymphocytes Relative 46  12 - 46 %    Lymphs Abs 5.1 (*) 0.7 - 4.0 K/uL    Monocytes Relative 11  3 - 12 %    Monocytes Absolute 1.2 (*) 0.1 - 1.0 K/uL    Eosinophils Relative 4  0 - 5 %    Eosinophils Absolute 0.5  0.0 - 0.7 K/uL    Basophils Relative 1  0 - 1 %    Basophils Absolute 0.1  0.0 - 0.1 K/uL   COMPREHENSIVE METABOLIC PANEL     Status: Abnormal   Collection Time   07/23/12  5:10 AM      Component Value Range Comment   Sodium 137  135 - 145 mEq/L    Potassium 4.3  3.5 - 5.1 mEq/L    Chloride 102  96 - 112 mEq/L    CO2 27  19 - 32 mEq/L    Glucose, Bld 127 (*) 70 - 99 mg/dL    BUN 18  6 - 23 mg/dL    Creatinine, Ser 0.86  0.50 - 1.10 mg/dL    Calcium 9.4  8.4 - 57.8 mg/dL    Total Protein 7.2  6.0 - 8.3 g/dL    Albumin 3.3 (*) 3.5 - 5.2 g/dL    AST 31  0 - 37 U/L    ALT 21  0 - 35 U/L    Alkaline Phosphatase 123 (*) 39 - 117 U/L    Total Bilirubin 0.2 (*) 0.3 - 1.2 mg/dL    GFR calc non Af Amer 63 (*) >90 mL/min    GFR calc Af Amer 73 (*) >90 mL/min   GLUCOSE, CAPILLARY     Status: Abnormal   Collection Time   07/23/12  7:43 AM      Component Value Range Comment    Glucose-Capillary 132 (*) 70 - 99 mg/dL    No results found.  Post Admission Physician Evaluation: 1. Functional deficits secondary  to L Hemiparesis R MCA distribution infarct. 2. Patient is admitted to receive collaborative, interdisciplinary care between the physiatrist, rehab nursing staff, and therapy team. 3. Patient's level of medical complexity and substantial therapy needs in context of that medical necessity cannot be provided at a lesser intensity of care such as a SNF. 4. Patient has experienced substantial functional loss from his/her baseline which was documented above under the "Functional History" and "Functional Status" headings.  Judging by the patient's diagnosis, physical exam, and functional history, the patient has potential for functional progress which will result in measurable gains while on inpatient rehab.  These gains will be of substantial and practical use upon discharge  in facilitating mobility and self-care at the household level. 5. Physiatrist will provide 24 hour  management of medical needs as well as oversight of the therapy plan/treatment and provide guidance as appropriate regarding the interaction of the two. 6. 24 hour rehab nursing will assist with bladder management, bowel management, safety, skin/wound care, disease management, medication administration and patient education  and help integrate therapy concepts, techniques,education, etc. 7. PT will assess and treat for:  Pre gait, gait, equipment, safety,equipment.  Goals are: sup mobility. 8. OT will assess and treat for: ADL,NM re education, safety equipment.   Goals are: Sup ADL. 9. SLP will assess and treat for: assess cognitive.  Goals are: Eval. 10. Case Management and Social Worker will assess and treat for psychological issues and discharge planning. 11. Team conference will be held weekly to assess progress toward goals and to determine barriers to discharge. 12. Patient will receive at least 3  hours of therapy per day at least 5 days per week. 13. ELOS: 2 wks      Prognosis:  good   Medical Problem List and Plan: 1. DVT Prophylaxis/Anticoagulation: Pharmaceutical: Lovenox 2. Pain Management: started on neurontin for left shoulder pain.  Add ice prn. Will monitor.  3. Mood: continue celexa and effexor  daily. LCSW to follow for formal evaluation.  4. Neuropsych: This patient is capable of making decisions on his/her own behalf. 5. CAD: On plavix and lopressor.  Lipitor added at this admission.  6. DM type 2: monitor with ac/hs CBG checks. Resume metformin bid as CT/MRI done without contrast. 7. HTN; monitor with bid checks. Continue cozaar and lopressor.    07/23/2012, 7:53 AM

## 2012-07-23 NOTE — Progress Notes (Signed)
Patient information reviewed and entered into [facility] system by Skyra Crichlow, RN, CRRN, PPS Coordinator.  Information including medical coding and functional independence measure will be reviewed and updated through discharge.     Per nursing patient was given "Data Collection Information Summary for Patients in Inpatient Rehabilitation Facilities with attached "Privacy Act Statement-Health Care Records" upon admission.   

## 2012-07-23 NOTE — Progress Notes (Signed)
Physical Therapy Session Note  Patient Details  Name: [name] MRN: [id number] Date of Birth: Dec 01, 1950  Today's Date: 07/23/2012 Time: 5284-1324 Time Calculation (min): 60 min  Short Term Goals: Week 1:  PT Short Term Goal 1 (Week 1): Pt will perform bed<>chair transfers with S throuhout entire tx PT Short Term Goal 2 (Week 1): Pt will perform sit<>supine with S only PT Short Term Goal 3 (Week 1): Pt will ambulate 35' with RW and Min A PT Short Term Goal 4 (Week 1): Pt will propell WC x150' with S  Skilled Therapeutic Interventions/Progress Updates:  No complaints of pain. Pt napping upon arrival, took a few minutes to fully become aware of situation. Initially, pt demonstrating impulsive behaviors, trying to stand and walk to door before prepared. Helped pt reorient to situation.   Stand-step transfer WC<>Nustep with Min A for steadying and cues for hand placement.   Nustep x10 min for increased rate of motor activation on L side, targeting >60spm with bil LE and LUE only x82min. Pt reporting 13 on BORG.   Gait training with RW x20' with up to Mod A for balance as pt persists with posterior lean, and becomes shocked when therapist assists to recover: "What you doing? I'm OK," when clearly unsafe.   Serial sit<>stand without UE from mat x10 for functional LE strengthening, focusing on terminal knee ext and upright trunk in standing.   Dynamic balance tasks:  - horseshoe toss: reaching down and L with RUE to grab horseshow and toss with RUE 2x10, needing Mod A for stability  - grab horseshoe on low left as to bend bil knees and reach to place on basket ball ring up right for increased L LE ext, with Mod A overall, occasionally Max for posterior LOB - step-taps to 4" step with RLE 2x10 with Max A for L LE stability and balance as pt tends to lean posterior and L  - step taps to numbered discs forward and laterally R/L with up to Max A stepping either side and difficulty clearing  LLE to reach target.       Therapy Documentation Precautions:  Precautions Precautions: Fall Precaution Comments: left weakness UE>LE Restrictions Weight Bearing Restrictions: No      See FIM for current functional status  Therapy/Group: Individual Therapy  Virl Cagey, PT 07/23/2012, 3:21 PM

## 2012-07-23 NOTE — Evaluation (Signed)
Occupational Therapy Assessment and Plan  Patient Details  Name: [name] MRN: [id number] Date of Birth: Jul 16, 1951  OT Diagnosis: hemiplegia affecting dominant side Rehab Potential: Rehab Potential: Good ELOS: 2.5 weeks   Today's Date: 07/23/2012 Time: 0900-1000 Time Calculation (min): 60 min  1:1 Pt seen for the initial evaluation and ADL retraining with B/D at sink and toileting.  Pt used BSC but also practiced toilet transfers.  Pt was very excited to see her LUE/LLE with increased arom today. Overall pt required max assist with self care, min to mod with functional mobility.  Pt is highly motivated and should be able to reach a supervision to mod I level.  Problem List:  Patient Active Problem List  Diagnosis  . AMI  . ALLERGIC RHINITIS  . BRONCHITIS  . HYPERSOMNIA, ASSOCIATED WITH SLEEP APNEA  . MUSCULOSKELETAL PAIN  . NEPHRECTOMY, HX OF  . Other Acquired Absence of Organ  . Left-sided weakness  . HTN (hypertension)  . HLD (hyperlipidemia)  . Depression  . Diabetes mellitus  . Stroke  . CVA (cerebral infarction)    Past Medical History:  Past Medical History  Diagnosis Date  . Heart attack   . Stroke   . Hypertension   . Diabetes mellitus   . Depression   . Sleep apnea   . Hyperlipidemia   . Bronchitis   . SOB (shortness of breath)   . Arthritis   . Dizziness   . Weakness   . Chronic kidney disease     possible stones - confirm with patient  . Sore throat   . Sinus problem   . Family history of breast cancer   . Renal cancer    Past Surgical History:  Past Surgical History  Procedure Date  . Spleen removal 2001  . Laparoscopic gastric banding 05/09/2008  . Abdominal hysterectomy 2002  . Cholecystectomy 1999  . Nephrectomy 2001    due to cancer    Assessment & Plan Clinical Impression: [name] is a 61 y.o. female with pmh of HTN, HLD, DM, hx of previously heart attack and remote pontine CVA; came to ED complaining of left side  weakness, LUE numbness and difficulty walking. Patient reports that symptoms started gradually around 10-11am after waking up; reports some associated base neck discomfort. Patient came to ED after more than 6 hours of worsening symptoms (reason why code stroke and TPA has been called). Patient denies CP, SOB, abdominal pain, nausea/vomiting, dysuria, cough or any other complaints. CT in ED negative for acute intracranial abnormalities. [facility] called to admit patient for further evaluation and treatment. Of note due to prior stroke and also MI, patient has been on plavix and reports to be compliant with medications. Patient transferred to CIR on 07/22/2012 .    Patient currently requires max with basic self-care skills secondary to muscle weakness, decreased coordination and decreased sitting balance, decreased standing balance and hemiplegia.  Prior to hospitalization, patient could complete all ADLS independently and was independent in the community.  Patient will benefit from skilled intervention to increase independence with basic self-care skills prior to discharge home with care partner.  Anticipate patient will require intermittent supervision and follow up home health.  OT - End of Session Endurance Deficit: Yes Endurance Deficit Description: Pt needing seated rest breaks following each tasks OT Assessment Rehab Potential: Good Barriers to Discharge: None Barriers to Discharge Comments: Pt is home alone during the day. OT Plan OT Frequency: 1-2 X/day, 60-90 minutes;5 out of  7 days Estimated Length of Stay: 2.5 weeks OT Treatment/Interventions: Balance/vestibular training;Discharge planning;Community reintegration;DME/adaptive equipment instruction;Functional mobility training;Neuromuscular re-education;Patient/family education;Self Care/advanced ADL retraining;Therapeutic Activities;Therapeutic Exercise;UE/LE Strength taining/ROM;UE/LE Coordination activities OT Recommendation Follow Up  Recommendations: Home health OT Equipment Recommended: Tub/shower seat  OT Evaluation Precautions/Restrictions  Precautions Precautions: Fall Precaution Comments: left weakness UE>LE Restrictions Weight Bearing Restrictions: No   Pain Pain Assessment Pain Assessment: No/denies pain Home Living/Prior Functioning Home Living Lives With: Spouse Available Help at Discharge: Family;Available PRN/intermittently Type of Home: House Home Access: Ramped entrance Home Layout: Two level;Bed/bath upstairs;Able to live on main level with bedroom/bathroom Alternate Level Stairs-Number of Steps: 13 Alternate Level Stairs-Rails: Right Bathroom Shower/Tub: Tub/shower unit;Walk-in shower Bathroom Toilet: Standard Home Adaptive Equipment: Bedside commode/3-in-1;Walker - rolling;Straight cane;Other (comment) Additional Comments: Pt dgtr has cerebral palsy and equipment left from dgtr who currently lives out of the home.  Prior Function Level of Independence: Independent with gait;Independent with transfers;Independent with basic ADLs Able to Take Stairs?: Yes Driving: Yes Vocation: On disability ADL Mod to max assist overall   Vision/Perception  Vision - History Baseline Vision: Wears glasses only for reading Patient Visual Report: No change from baseline Vision - Assessment Eye Alignment: Within Functional Limits Vision Assessment: Vision not tested Perception Perception: Within Functional Limits Praxis Praxis: Intact  Cognition Overall Cognitive Status: Appears within functional limits for tasks assessed Orientation Level: Oriented X4 Memory: Impaired Memory Impairment: Decreased short term memory Sensation Sensation Light Touch: Appears Intact Stereognosis: Appears Intact Hot/Cold: Appears Intact Proprioception: Appears Intact Coordination Gross Motor Movements are Fluid and Coordinated: No Fine Motor Movements are Fluid and Coordinated: No Coordination and Movement  Description: Decreased timing Heel Shin Test: Impaired L Motor  Motor Motor: Hemiplegia;Abnormal postural alignment and control Motor - Skilled Clinical Observations: Decreased coordination with functional tasks Mobility  Bed Mobility Bed Mobility: Rolling Left;Left Sidelying to Sit;Sit to Supine Rolling Right: 3: Mod assist Rolling Right Details: Tactile cues for initiation;Verbal cues for sequencing;Verbal cues for technique Rolling Right Details (indicate cue type and reason): assist with LLE and carrying LUE Left Sidelying to Sit: 3: Mod assist Left Sidelying to Sit Details: Manual facilitation for weight shifting;Manual facilitation for placement;Verbal cues for sequencing;Verbal cues for technique Left Sidelying to Sit Details (indicate cue type and reason): Assist for trunk lifting Sit to Supine: 4: Min assist Sit to Supine - Details (indicate cue type and reason): Assist for LLE  Trunk/Postural Assessment  Cervical Assessment Cervical Assessment: Within Functional Limits Thoracic Assessment Thoracic Assessment: Within Functional Limits Lumbar Assessment Lumbar Assessment: Within Functional Limits Postural Control Postural Control: Deficits on evaluation Trunk Control: Pt with posterior lean in sitting and standing, needing mod A to recover Righting Reactions: Slowed  Balance Balance Balance Assessed: Yes Static Sitting Balance Static Sitting - Balance Support: Feet supported;Right upper extremity supported Static Sitting - Level of Assistance: 5: Stand by assistance Dynamic Sitting Balance Dynamic Sitting - Level of Assistance: 4: Min assist Static Standing Balance Static Standing - Balance Support: No upper extremity supported Static Standing - Level of Assistance: 3: Mod assist Static Standing - Comment/# of Minutes: Pt with posterior lean, unable to self-correct Dynamic Standing Balance Dynamic Standing - Balance Support: No upper extremity supported Dynamic  Standing - Level of Assistance: 2: Max assist Extremity/Trunk Assessment RUE Assessment RUE Assessment: Within Functional Limits LUE Assessment LUE Assessment: Exceptions to [facility] LUE Strength LUE Overall Strength Comments: Some moveemnt returning at shoulder and elbow, none at hand/grip  See FIM for current functional status Refer to  Care Plan for Long Term Goals  Recommendations for other services: None  Discharge Criteria: Patient will be discharged from OT if patient refuses treatment 3 consecutive times without medical reason, if treatment goals not met, if there is a change in medical status, if patient makes no progress towards goals or if patient is discharged from hospital.  The above assessment, treatment plan, treatment alternatives and goals were discussed and mutually agreed upon: by patient  [facility] 07/23/2012, 12:07 PM

## 2012-07-24 ENCOUNTER — Inpatient Hospital Stay (HOSPITAL_COMMUNITY): Payer: BC Managed Care – PPO

## 2012-07-24 ENCOUNTER — Inpatient Hospital Stay (HOSPITAL_COMMUNITY): Payer: BC Managed Care – PPO | Admitting: Physical Therapy

## 2012-07-24 LAB — GLUCOSE, CAPILLARY
Glucose-Capillary: 122 mg/dL — ABNORMAL HIGH (ref 70–99)
Glucose-Capillary: 123 mg/dL — ABNORMAL HIGH (ref 70–99)
Glucose-Capillary: 130 mg/dL — ABNORMAL HIGH (ref 70–99)

## 2012-07-24 LAB — URINALYSIS, ROUTINE W REFLEX MICROSCOPIC
Bilirubin Urine: NEGATIVE
Hgb urine dipstick: NEGATIVE
Nitrite: NEGATIVE
Specific Gravity, Urine: 1.014 (ref 1.005–1.030)
Urobilinogen, UA: 0.2 mg/dL (ref 0.0–1.0)
pH: 7 (ref 5.0–8.0)

## 2012-07-24 NOTE — Progress Notes (Deleted)
Occupational Therapy Session Note  Patient Details  Name: [name] MRN: [id number] Date of Birth: 1951-04-12  Today's Date: 07/24/2012 Time: 4098-1191 Time Calculation (min): 45 min  Short Term Goals: Week 1:  OT Short Term Goal 1 (Week 1): Pt will ambulate to toilet with min assist with RW. OT Short Term Goal 2 (Week 1): Pt will don shirt with set up. OT Short Term Goal 3 (Week 1): Pt will don pants with min assist. OT Short Term Goal 4 (Week 1): Pt will stand at sink to brush teeth with supervision. OT Short Term Goal 5 (Week 1): Pt will transfer to shower chair with min assist.  Skilled Therapeutic Interventions: ADL-retraining at walk-in shower with emphasis on dynamic sitting and standing balance, management of residual limb (with pain exacerbation at end of treatment), transfers, endurance, and effective use of DME/AE.   Patient tolerated shower and treatment well but fatigued near end of treatment (after 30 min) and required two rest breaks during seated dressing.  Patient reported that session was beneficial but she was surprised by her sudden loss of stamina and requested bed rest to recover following assist dressing.     Therapy Documentation Precautions:  Precautions Precautions: Fall Precaution Comments: left weakness UE>LE Restrictions Weight Bearing Restrictions: No   Pain: Pain Assessment Pain Assessment: 0-10 Pain Score: 0-No pain  See FIM for current functional status  Therapy/Group: Individual Therapy  Second session: Time:1420 Time Calculation (min):  0 min OT Cancel d/t prolonged transfusion required (medicine service).  Pain Assessment: No pain  Skilled Therapeutic Interventions: Patient unable to participate in therapy due to fatigue and transfusion (blood products).   Patient stated, "you wore me out this morning (smile)."   See FIM for current functional status  Therapy: Cancelled Therapy     Georgeanne Nim 07/24/2012, 12:29  PM

## 2012-07-24 NOTE — Progress Notes (Signed)
Physical Therapy Note  Patient Details  Name: [name] MRN: [id number] Date of Birth: Jun 16, 1951 Today's Date: 07/24/2012  4098-1191 (55 minutes) individual Pain: no complaint of pain Focus of treatment: Neuro re-ed LT LE in closed chain positions; gait training with /without AD ; standing balance activites Treatment: Wc mobility- unable to use hemi technique secondary to height of wc; gait 63 feet X 1 with RW + Hand splint left min assist with mod vcs to increase step length LT LE; up.down 4 inch step X 15 forwards/sideways LT LE only for closed chain strengthening/control; up/down 6 inch step for LT LE hip flexor strengthening/ AROM; Balance; reaching activities in standing toward left min assist with no loss of balance; gait without AD - 25 feet X 2 min/mod assist with increased lean to left in stance; Nustep Level 5 LEs only X 5 minutes .  1520-1550 (30 minutes) individual Pain: no complaint of pain  Focus of treatment: gait training with/without AD; Neuro re-ed Lt knee extension control in standing using Kinetron Treatment: gait with RW + lefgt hand splint min assist 50 feet with vcs to increase step length bilaterally especially on left; gait without AD mod assist with posterior trunk lean and vcs to increase step length bilaterally 50 feet X 2 ; Kinetron in sitting X 30 reps; Kinetron in standing - pt unable to attain full terminal knee extension on left in stance and maintain standing balance with RT UE assist.  [name],JIM 07/24/2012, 8:02 AM

## 2012-07-24 NOTE — Progress Notes (Signed)
Subjective/Complaints: Frequent and urgent urination no burning, incontinence  Objective: Vital Signs: Blood pressure 169/75, pulse 63, temperature 97.5 F (36.4 C), temperature source Oral, resp. rate 20, SpO2 95.00%. No results found. Results for orders placed during the hospital encounter of 07/22/12 (from the past 72 hour(s))  GLUCOSE, CAPILLARY     Status: Abnormal   Collection Time   07/22/12  8:49 PM      Component Value Range Comment   Glucose-Capillary 144 (*) 70 - 99 mg/dL    Comment 1 Notify RN     CBC WITH DIFFERENTIAL     Status: Abnormal   Collection Time   07/23/12  5:10 AM      Component Value Range Comment   WBC 11.1 (*) 4.0 - 10.5 K/uL    RBC 4.10  3.87 - 5.11 MIL/uL    Hemoglobin 12.4  12.0 - 15.0 g/dL    HCT 86.5  78.4 - 69.6 %    MCV 90.7  78.0 - 100.0 fL    MCH 30.2  26.0 - 34.0 pg    MCHC 33.3  30.0 - 36.0 g/dL    RDW 29.5  28.4 - 13.2 %    Platelets 332  150 - 400 K/uL    Neutrophils Relative 39 (*) 43 - 77 %    Neutro Abs 4.3  1.7 - 7.7 K/uL    Lymphocytes Relative 46  12 - 46 %    Lymphs Abs 5.1 (*) 0.7 - 4.0 K/uL    Monocytes Relative 11  3 - 12 %    Monocytes Absolute 1.2 (*) 0.1 - 1.0 K/uL    Eosinophils Relative 4  0 - 5 %    Eosinophils Absolute 0.5  0.0 - 0.7 K/uL    Basophils Relative 1  0 - 1 %    Basophils Absolute 0.1  0.0 - 0.1 K/uL   COMPREHENSIVE METABOLIC PANEL     Status: Abnormal   Collection Time   07/23/12  5:10 AM      Component Value Range Comment   Sodium 137  135 - 145 mEq/L    Potassium 4.3  3.5 - 5.1 mEq/L    Chloride 102  96 - 112 mEq/L    CO2 27  19 - 32 mEq/L    Glucose, Bld 127 (*) 70 - 99 mg/dL    BUN 18  6 - 23 mg/dL    Creatinine, Ser 4.40  0.50 - 1.10 mg/dL    Calcium 9.4  8.4 - 10.2 mg/dL    Total Protein 7.2  6.0 - 8.3 g/dL    Albumin 3.3 (*) 3.5 - 5.2 g/dL    AST 31  0 - 37 U/L    ALT 21  0 - 35 U/L    Alkaline Phosphatase 123 (*) 39 - 117 U/L    Total Bilirubin 0.2 (*) 0.3 - 1.2 mg/dL    GFR calc non  Af Amer 63 (*) >90 mL/min    GFR calc Af Amer 73 (*) >90 mL/min   GLUCOSE, CAPILLARY     Status: Abnormal   Collection Time   07/23/12  7:43 AM      Component Value Range Comment   Glucose-Capillary 132 (*) 70 - 99 mg/dL   GLUCOSE, CAPILLARY     Status: Abnormal   Collection Time   07/23/12 11:38 AM      Component Value Range Comment   Glucose-Capillary 137 (*) 70 - 99 mg/dL   GLUCOSE, CAPILLARY  Status: Abnormal   Collection Time   07/23/12  4:49 PM      Component Value Range Comment   Glucose-Capillary 143 (*) 70 - 99 mg/dL    Comment 1 Notify RN     GLUCOSE, CAPILLARY     Status: Abnormal   Collection Time   07/23/12  8:46 PM      Component Value Range Comment   Glucose-Capillary 132 (*) 70 - 99 mg/dL    Comment 1 Notify RN     GLUCOSE, CAPILLARY     Status: Abnormal   Collection Time   07/24/12  7:31 AM      Component Value Range Comment   Glucose-Capillary 122 (*) 70 - 99 mg/dL      HEENT: normal Cardio: RRR Resp: CTA B/L GI: BS positive Extremity:  No Edema Skin:   Intact Neuro: Alert/Oriented, Cranial Nerve II-XII normal, Normal Sensory, Abnormal Motor R side 5/5, L side 1/5 distal, 2-/5 prox UE, 3/5 LLE and Abnormal FMC Ataxic/ dec FMC Musc/Skel:  Normal   Assessment/Plan: 1. 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 Physiatrist is providing close team supervision and 24 hour management of active medical problems listed below. Physiatrist and rehab team continue to assess barriers to discharge/monitor patient progress toward functional and medical goals. FIM: FIM - Bathing Bathing Steps Patient Completed: Chest;Left Arm;Abdomen;Front perineal area;Right upper leg;Left upper leg Bathing: 3: Mod-Patient completes 5-7 40f 10 parts or 50-74%  FIM - Upper Body Dressing/Undressing Upper body dressing/undressing steps patient completed:  Pull shirt over trunk;Put head through opening of pull over shirt/dress;Thread/unthread right sleeve of pullover shirt/dresss Upper body dressing/undressing: 3: Mod-Patient completed 50-74% of tasks FIM - Lower Body Dressing/Undressing Lower body dressing/undressing: 0: Wears gown/pajamas-no public clothing  FIM - Toileting Toileting steps completed by patient: Performs perineal hygiene Toileting: 2: Max-Patient completed 1 of 3 steps  FIM - Diplomatic Services operational officer Devices: Grab bars Toilet Transfers: 4-To toilet/BSC: Min A (steadying Pt. > 75%);4-From toilet/BSC: Min A (steadying Pt. > 75%)  FIM - Bed/Chair Transfer Bed/Chair Transfer Assistive Devices: Arm rests Bed/Chair Transfer: 3: Supine > Sit: Mod A (lifting assist/Pt. 50-74%/lift 2 legs;4: Sit > Supine: Min A (steadying pt. > 75%/lift 1 leg);4: Bed > Chair or W/C: Min A (steadying Pt. > 75%);4: Chair or W/C > Bed: Min A (steadying Pt. > 75%)  FIM - Locomotion: Wheelchair Locomotion: Wheelchair: 1: Total Assistance/staff pushes wheelchair (Pt<25%) FIM - Locomotion: Ambulation Locomotion: Ambulation Assistive Devices: Other (comment) (HHA) Ambulation/Gait Assistance: 2: Max assist Locomotion: Ambulation: 1: Travels less than 50 ft with maximal assistance (Pt: 25 - 49%)  Comprehension Comprehension Mode: Auditory Comprehension: 5-Follows basic conversation/direction: With no assist  Expression Expression: 5-Expresses basic needs/ideas: With no assist  Social Interaction Social Interaction: 5-Interacts appropriately 90% of the time - Needs monitoring or encouragement for participation or interaction.  Problem Solving Problem Solving: 5-Solves basic 90% of the time/requires cueing < 10% of the time  Memory Memory: 6-More than reasonable amt of time  Medical Problem List and Plan:  1. DVT Prophylaxis/Anticoagulation: Pharmaceutical: Lovenox  2. Pain Management: started on neurontin for left shoulder  pain. Add ice prn. Will monitor.  3. Mood: continue celexa and effexor daily. LCSW to follow for formal evaluation.  4. Neuropsych: This patient is capable of making decisions on his/her own behalf.  5. CAD: On plavix and lopressor. Lipitor added  at this admission.  6. DM type 2: monitor with ac/hs CBG checks. Resume metformin bid as CT/MRI done without contrast.  7. HTN; monitor with bid checks. Continue cozaar and lopressor 8. Incontinence - check UA   LOS (Days) 2 A FACE TO FACE EVALUATION WAS PERFORMED  Alex Plotnikov 07/24/2012, 9:44 AM

## 2012-07-24 NOTE — Progress Notes (Signed)
Speech Language Pathology   This SLP just observed received order for speech-cognitive assessment today, originally ordered 9/26.  Assessment will be completed as soon as possible.    Breck Coons [place].[employer] [phone]  07/24/2012

## 2012-07-24 NOTE — Progress Notes (Signed)
Occupational Therapy Session Note  Patient Details  Name: [name] MRN: [id number] Date of Birth: 1950/12/27  Today's Date: 07/24/2012 Time: 800-845 Time Calculation (min): 45 min  Short Term Goals: Week 1:  OT Short Term Goal 1 (Week 1): Pt will ambulate to toilet with min assist with RW. OT Short Term Goal 2 (Week 1): Pt will don shirt with set up. OT Short Term Goal 3 (Week 1): Pt will don pants with min assist. OT Short Term Goal 4 (Week 1): Pt will stand at sink to brush teeth with supervision. OT Short Term Goal 5 (Week 1): Pt will transfer to shower chair with min assist.  Skilled Therapeutic Interventions: ADL-retraining at walk-in shower with emphasis on safety, transfers, dynamic sitting and standing balance,  neuromuscular re-ed of left UE, weight-shifting, and effective use of DME (hopsital bed, shower chair, hand shower, and grab bars).   Patient participated well and attempted ADL with use of L-UE (dominant hand) without need for cues.   Patient was highly motivated to participate in treatment.   Patient demo's moderate posterior lean during dynamic standing activities and compensates for LOB with widened stance.     Therapy Documentation Precautions:  Precautions Precautions: Fall Precaution Comments: left weakness UE>LE Restrictions Weight Bearing Restrictions: No  Pain: No report of pain    See FIM for current functional status  Therapy/Group: Individual Therapy  Second session: Time: 1308-6578 Time Calculation (min):  30 min  Pain Assessment: No report of pain  General: Missed Time, 15 min, visitors present (& therapist delayed for treatment)  Skilled Therapeutic Interventions: Therapeutic activities with emphasis on bed mobility, transfers, L-UE sensory/motor reintegration, hemi-dressing skills, endurance and UE strengthening.   Patient completed trial use of SCIFIT machine, performing 5 min of UE ergometry without resistance.  Re-educated patient  on method of dressing affected side (left) first when donning clothing.   Family present included sister, brother, mother, and friend.  See FIM for current functional status  Therapy: Individual Therapy   Georgeanne Nim 07/24/2012, 4:19 PM

## 2012-07-25 ENCOUNTER — Inpatient Hospital Stay (HOSPITAL_COMMUNITY): Payer: BC Managed Care – PPO | Admitting: *Deleted

## 2012-07-25 LAB — GLUCOSE, CAPILLARY: Glucose-Capillary: 114 mg/dL — ABNORMAL HIGH (ref 70–99)

## 2012-07-25 MED ORDER — METOPROLOL TARTRATE 50 MG PO TABS
75.0000 mg | ORAL_TABLET | Freq: Two times a day (BID) | ORAL | Status: DC
  Administered 2012-07-25 – 2012-07-29 (×9): 75 mg via ORAL
  Filled 2012-07-25 (×13): qty 1

## 2012-07-25 NOTE — Progress Notes (Signed)
Subjective/Complaints: Frequent and urgent urination no burning, incontinence C/o chronic discomfort in L hand  Objective: Vital Signs: Blood pressure 154/76, pulse 60, temperature 98.3 F (36.8 C), temperature source Other (Comment), resp. rate 20, SpO2 98.00%. No results found. Results for orders placed during the hospital encounter of 07/22/12 (from the past 72 hour(s))  GLUCOSE, CAPILLARY     Status: Abnormal   Collection Time   07/22/12  8:49 PM      Component Value Range Comment   Glucose-Capillary 144 (*) 70 - 99 mg/dL    Comment 1 Notify RN     CBC WITH DIFFERENTIAL     Status: Abnormal   Collection Time   07/23/12  5:10 AM      Component Value Range Comment   WBC 11.1 (*) 4.0 - 10.5 K/uL    RBC 4.10  3.87 - 5.11 MIL/uL    Hemoglobin 12.4  12.0 - 15.0 g/dL    HCT 40.9  81.1 - 91.4 %    MCV 90.7  78.0 - 100.0 fL    MCH 30.2  26.0 - 34.0 pg    MCHC 33.3  30.0 - 36.0 g/dL    RDW 78.2  95.6 - 21.3 %    Platelets 332  150 - 400 K/uL    Neutrophils Relative 39 (*) 43 - 77 %    Neutro Abs 4.3  1.7 - 7.7 K/uL    Lymphocytes Relative 46  12 - 46 %    Lymphs Abs 5.1 (*) 0.7 - 4.0 K/uL    Monocytes Relative 11  3 - 12 %    Monocytes Absolute 1.2 (*) 0.1 - 1.0 K/uL    Eosinophils Relative 4  0 - 5 %    Eosinophils Absolute 0.5  0.0 - 0.7 K/uL    Basophils Relative 1  0 - 1 %    Basophils Absolute 0.1  0.0 - 0.1 K/uL   COMPREHENSIVE METABOLIC PANEL     Status: Abnormal   Collection Time   07/23/12  5:10 AM      Component Value Range Comment   Sodium 137  135 - 145 mEq/L    Potassium 4.3  3.5 - 5.1 mEq/L    Chloride 102  96 - 112 mEq/L    CO2 27  19 - 32 mEq/L    Glucose, Bld 127 (*) 70 - 99 mg/dL    BUN 18  6 - 23 mg/dL    Creatinine, Ser 0.86  0.50 - 1.10 mg/dL    Calcium 9.4  8.4 - 57.8 mg/dL    Total Protein 7.2  6.0 - 8.3 g/dL    Albumin 3.3 (*) 3.5 - 5.2 g/dL    AST 31  0 - 37 U/L    ALT 21  0 - 35 U/L    Alkaline Phosphatase 123 (*) 39 - 117 U/L    Total  Bilirubin 0.2 (*) 0.3 - 1.2 mg/dL    GFR calc non Af Amer 63 (*) >90 mL/min    GFR calc Af Amer 73 (*) >90 mL/min   GLUCOSE, CAPILLARY     Status: Abnormal   Collection Time   07/23/12  7:43 AM      Component Value Range Comment   Glucose-Capillary 132 (*) 70 - 99 mg/dL   GLUCOSE, CAPILLARY     Status: Abnormal   Collection Time   07/23/12 11:38 AM      Component Value Range Comment   Glucose-Capillary 137 (*) 70 -  99 mg/dL   GLUCOSE, CAPILLARY     Status: Abnormal   Collection Time   07/23/12  4:49 PM      Component Value Range Comment   Glucose-Capillary 143 (*) 70 - 99 mg/dL    Comment 1 Notify RN     GLUCOSE, CAPILLARY     Status: Abnormal   Collection Time   07/23/12  8:46 PM      Component Value Range Comment   Glucose-Capillary 132 (*) 70 - 99 mg/dL    Comment 1 Notify RN     GLUCOSE, CAPILLARY     Status: Abnormal   Collection Time   07/24/12  7:31 AM      Component Value Range Comment   Glucose-Capillary 122 (*) 70 - 99 mg/dL   GLUCOSE, CAPILLARY     Status: Abnormal   Collection Time   07/24/12 11:58 AM      Component Value Range Comment   Glucose-Capillary 123 (*) 70 - 99 mg/dL   URINALYSIS, ROUTINE W REFLEX MICROSCOPIC     Status: Normal   Collection Time   07/24/12 12:07 PM      Component Value Range Comment   Color, Urine YELLOW  YELLOW    APPearance CLEAR  CLEAR    Specific Gravity, Urine 1.014  1.005 - 1.030    pH 7.0  5.0 - 8.0    Glucose, UA NEGATIVE  NEGATIVE mg/dL    Hgb urine dipstick NEGATIVE  NEGATIVE    Bilirubin Urine NEGATIVE  NEGATIVE    Ketones, ur NEGATIVE  NEGATIVE mg/dL    Protein, ur NEGATIVE  NEGATIVE mg/dL    Urobilinogen, UA 0.2  0.0 - 1.0 mg/dL    Nitrite NEGATIVE  NEGATIVE    Leukocytes, UA NEGATIVE  NEGATIVE MICROSCOPIC NOT DONE ON URINES WITH NEGATIVE PROTEIN, BLOOD, LEUKOCYTES, NITRITE, OR GLUCOSE <1000 mg/dL.  GLUCOSE, CAPILLARY     Status: Abnormal   Collection Time   07/24/12  4:32 PM      Component Value Range Comment    Glucose-Capillary 130 (*) 70 - 99 mg/dL    Comment 1 Notify RN     GLUCOSE, CAPILLARY     Status: Abnormal   Collection Time   07/24/12  9:39 PM      Component Value Range Comment   Glucose-Capillary 140 (*) 70 - 99 mg/dL    Comment 1 Notify RN        HEENT: normal Cardio: RRR Resp: CTA B/L GI: BS positive Extremity:  No Edema Skin:   Intact Neuro: Alert/Oriented, Cranial Nerve II-XII normal, Normal Sensory, Abnormal Motor R side 5/5, L side 1/5 distal, 2-/5 prox UE, 3/5 LLE and Abnormal FMC Ataxic/ dec FMC Musc/Skel:  Normal   Assessment/Plan: 1. 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 Physiatrist is providing close team supervision and 24 hour management of active medical problems listed below. Physiatrist and rehab team continue to assess barriers to discharge/monitor patient progress toward functional and medical goals. FIM: FIM - Bathing Bathing Steps Patient Completed: Chest;Right Arm;Left Arm;Abdomen;Front perineal area;Buttocks;Right upper leg;Left upper leg Bathing: 4: Min-Patient completes 8-9 38f 10 parts or 75+ percent  FIM - Upper Body Dressing/Undressing Upper body dressing/undressing steps patient completed: Thread/unthread right sleeve of pullover shirt/dresss;Put head through opening of pull over shirt/dress;Pull shirt over trunk Upper body dressing/undressing: 4: Min-Patient completed 75 plus % of tasks FIM -  Lower Body Dressing/Undressing Lower body dressing/undressing steps patient completed: Pull underwear up/down Lower body dressing/undressing: 0: Wears gown/pajamas-no public clothing  FIM - Toileting Toileting steps completed by patient: Performs perineal hygiene Toileting: 2: Max-Patient completed 1 of 3 steps  FIM - Diplomatic Services operational officer Devices: Grab bars Toilet Transfers: 4-To toilet/BSC: Min A  (steadying Pt. > 75%);4-From toilet/BSC: Min A (steadying Pt. > 75%)  FIM - Bed/Chair Transfer Bed/Chair Transfer Assistive Devices: Bed rails;Arm rests;HOB elevated Bed/Chair Transfer: 4: Supine > Sit: Min A (steadying Pt. > 75%/lift 1 leg);4: Bed > Chair or W/C: Min A (steadying Pt. > 75%)  FIM - Locomotion: Wheelchair Locomotion: Wheelchair: 1: Total Assistance/staff pushes wheelchair (Pt<25%) FIM - Locomotion: Ambulation Locomotion: Ambulation Assistive Devices: Other (comment) (HHA) Ambulation/Gait Assistance: 2: Max assist Locomotion: Ambulation: 1: Travels less than 50 ft with maximal assistance (Pt: 25 - 49%)  Comprehension Comprehension Mode: Auditory Comprehension: 7-Follows complex conversation/direction: With no assist  Expression Expression Mode: Verbal Expression: 7-Expresses complex ideas: With no assist  Social Interaction Social Interaction: 7-Interacts appropriately with others - No medications needed.  Problem Solving Problem Solving: 5-Solves basic problems: With no assist  Memory Memory: 6-More than reasonable amt of time  Medical Problem List and Plan:  1. DVT Prophylaxis/Anticoagulation: Pharmaceutical: Lovenox  2. Pain Management: started on neurontin for left shoulder pain. Add ice prn. Will monitor.  3. Mood: continue celexa and effexor daily. LCSW to follow for formal evaluation.  4. Neuropsych: This patient is capable of making decisions on his/her own behalf.  5. CAD: On plavix and lopressor. Lipitor added at this admission.  6. DM type 2: monitor with ac/hs CBG checks. Resume metformin bid as CT/MRI done without contrast.  7. HTN; monitor with bid checks. Continue cozaar; will try to increase the dose of lopressor. 8. Incontinence - check UA: UA was OK. We can use symptomatic therapy if needed   LOS (Days) 3 A FACE TO FACE EVALUATION WAS PERFORMED  [name] 07/25/2012, 8:34 AM

## 2012-07-25 NOTE — Progress Notes (Signed)
Physical Therapy Session Note  Patient Details  Name: [name] MRN: [id number] Date of Birth: May 10, 1951  Today's Date: 07/25/2012 Time: 8:00-8:45 ( )   Short Term Goals: Week 1:  PT Short Term Goal 1 (Week 1): Pt will perform bed<>chair transfers with S throuhout entire tx PT Short Term Goal 2 (Week 1): Pt will perform sit<>supine with S only PT Short Term Goal 3 (Week 1): Pt will ambulate 46' with RW and Min A PT Short Term Goal 4 (Week 1): Pt will propell WC x150' with S  Skilled Therapeutic Interventions/Progress Updates:  Tx focused on transfer training, gait training, and NMR for LLE.  Pt received laying partially in bed with LEs hanging off side, trying to sit EOB for breakfast. Assisted pt back to bed and reattempted with lgoroll with Mod A for LE set-up and trunk lifting, cues for sequence and technique.  Stand-step transfers x4 with Min A only and cues for hand placement.   NMR for LLE stability and coordination with step-taps to front on 4" step without UE assist and up to Mod A for recovery to L. Taps x20 R/L with most difficulty stepping R. Pt needing cues to move slowly for control and to regain balance prior to each step.   Gait training with/without device to challenge balance.  1x100' with RW and Min/Mod A for stability as pt tends to decrease attention to task with environmental distractions. Pt persists with shuffling gait pattern and decreased LLE ext with fatigue.  Gait speed with RW = 0.23 m/s Gait 1x100' with no device, L HHA and up to Mod A for stability on L. Pt with difficulty maintaining quality with cues for step length and L foot clearance.   Nustep with bil LE and LUE (grip) only for increased timing of motor activation on L x31min, level 4.   No complaints of pain     Therapy Documentation Precautions:  Precautions Precautions: Fall Precaution Comments: left weakness UE>LE Restrictions Weight Bearing Restrictions: No  See FIM for  current functional status  Therapy/Group: Individual Therapy  Virl Cagey, PT 07/25/2012, 8:22 AM

## 2012-07-26 ENCOUNTER — Inpatient Hospital Stay (HOSPITAL_COMMUNITY): Payer: BC Managed Care – PPO | Admitting: Physical Therapy

## 2012-07-26 ENCOUNTER — Inpatient Hospital Stay (HOSPITAL_COMMUNITY): Payer: BC Managed Care – PPO | Admitting: Occupational Therapy

## 2012-07-26 LAB — GLUCOSE, CAPILLARY: Glucose-Capillary: 134 mg/dL — ABNORMAL HIGH (ref 70–99)

## 2012-07-26 MED ORDER — OXYBUTYNIN CHLORIDE ER 5 MG PO TB24
5.0000 mg | ORAL_TABLET | Freq: Every day | ORAL | Status: DC
  Administered 2012-07-26 – 2012-08-03 (×9): 5 mg via ORAL
  Filled 2012-07-26 (×11): qty 1

## 2012-07-26 NOTE — Evaluation (Signed)
Speech Language Pathology Assessment and Plan  Patient Details  Name: [name] MRN: [id number] Date of Birth: 06-01-1951  SLP Diagnosis: Cognitive Impairments  Rehab Potential: Good ELOS: 2 weeks   Today's Date: 07/26/2012 Time: 4098-1191 Time Calculation (min): 25 min  Problem List:  Patient Active Problem List  Diagnosis  . AMI  . ALLERGIC RHINITIS  . BRONCHITIS  . HYPERSOMNIA, ASSOCIATED WITH SLEEP APNEA  . MUSCULOSKELETAL PAIN  . NEPHRECTOMY, HX OF  . Other Acquired Absence of Organ  . Left-sided weakness  . HTN (hypertension)  . HLD (hyperlipidemia)  . Depression  . Diabetes mellitus  . Stroke  . CVA (cerebral infarction)   Past Medical History:  Past Medical History  Diagnosis Date  . Heart attack   . Stroke   . Hypertension   . Diabetes mellitus   . Depression   . Sleep apnea   . Hyperlipidemia   . Bronchitis   . SOB (shortness of breath)   . Arthritis   . Dizziness   . Weakness   . Chronic kidney disease     possible stones - confirm with patient  . Sore throat   . Sinus problem   . Family history of breast cancer   . Renal cancer    Past Surgical History:  Past Surgical History  Procedure Date  . Spleen removal 2001  . Laparoscopic gastric banding 05/09/2008  . Abdominal hysterectomy 2002  . Cholecystectomy 1999  . Nephrectomy 2001    due to cancer    Assessment / Plan / Recommendation Clinical Impression  [name] is a 61 y.o. 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 Husband reports that patient with decline in memory over past several weeks as well as balance deficits with unsteady gait. Dr. 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 Patient continues with left hemiparesis with wide based unsteady gait. Patient transferred to [facility] 07/22/12 and evaluation completed today.  Cognitive-linguistic evaluation revealed decreased recall of short term and new information such as medications and transfer procedures as well as decreased ability to compensate for new deficit since CVA.  As a result, SLP recommends skilled treatment at a frequency of 3 times a week to address high level cognitive deficits, which will maximize functional independence and reduce burden of care upon discharge.       SLP Assessment  Patient will need skilled Speech Lanaguage Pathology Services during CIR admission    Recommendations  Follow up Recommendations: Other (comment) (TBD) Equipment Recommended: None recommended by SLP    SLP Frequency 1-2 X/day, 30-60 minutes;3 out of 7 days   SLP Treatment/Interventions Cognitive remediation/compensation;Cueing hierarchy;Functional tasks;Medication managment;Internal/external aids;Patient/family education;Therapeutic Activities    Pain  0 Prior Functioning Cognitive/Linguistic Baseline: Baseline deficits Baseline deficit details: short term recall deficits  Short Term Goals: Week 1: SLP Short Term Goal 1 (Week 1): Patient will recall steps of a transfer sequence as taught by PT/OT with supervision verbal cues SLP Short Term Goal 2 (Week 1): Patient will recall new medication name and functions with supervision vebral cues to utilize external aids SLP Short Term Goal 3 (Week 1): Patient will solve complex problems with supervision level verbal cues  See FIM for current functional status Refer to Care Plan  for Long Term Goals  Recommendations for other services: None  Discharge Criteria: Patient will be discharged from SLP if patient refuses treatment 3 consecutive times without medical reason, if treatment goals not met, if there is a change in medical status, if patient makes  no progress towards goals or if patient is discharged from hospital.  The above assessment, treatment plan, treatment alternatives and goals were discussed and mutually agreed upon: by patient  Fae Pippin, M.A., CCC-SLP [phone]  Jashay Roddy 07/26/2012, 5:12 PM

## 2012-07-26 NOTE — Progress Notes (Signed)
Occupational Therapy Session Note  Patient Details  Name: [name] MRN: [id number] Date of Birth: 12/25/1950  Today's Date: 07/26/2012 Time: 0900-1000 and 1100-1140 Time Calculation (min): 60 min and 40 min  Short Term Goals: Week 1:  OT Short Term Goal 1 (Week 1): Pt will ambulate to toilet with min assist with RW. OT Short Term Goal 2 (Week 1): Pt will don shirt with set up. OT Short Term Goal 3 (Week 1): Pt will don pants with min assist. OT Short Term Goal 4 (Week 1): Pt will stand at sink to brush teeth with supervision. OT Short Term Goal 5 (Week 1): Pt will transfer to shower chair with min assist.  Skilled Therapeutic Interventions/Progress Updates:  Visit 1:  Pt seen for ADL retraining of B/D at shower level with a focus on LUE active use, standing balance, and functional mobility.  Pt was able to ambulate with RW in and out of shower with min assist and demonstrated improved standing balance.  Pt was able to use LUE as a stabilizing assist.  Good activity tolerance today.  Visit 2: Pt seen for LUE neuro re-ed activities. Pt was anxious to walk to gym with RW.  She did well with close supervision. On return trip to room, she became very fatigue and was having difficulty stabilizing LLE.  She was able to continue walking back.  Pt worked on a/arom, bimanual activities with ball and dowel, grasping/ releasing cones. Pt is demonstrating developing grasp, shoulder flex/ extension. She was excited about her progress.     Therapy Documentation Precautions:  Precautions Precautions: Fall Precaution Comments: left weakness UE>LE Restrictions Weight Bearing Restrictions: No   Pain: Pain Assessment Pain Assessment: No/denies pain Pain Score: 0-No pain ADL:  See FIM for current functional status  Therapy/Group: Individual Therapy  SAGUIER,JULIA 07/26/2012, 11:59 AM

## 2012-07-26 NOTE — Progress Notes (Signed)
Physical Therapy Note  Patient Details  Name: [name] MRN: [id number] Date of Birth: 04/20/1951 Today's Date: 07/26/2012  1400 - 1455 (55 minutes) individual Pain: no complaint of pain  Focus of treatment: Therapeutic activities focused on standing balance without AD; LT LE knee /hip extension control in stance Treatment: Standing - reaching to right or left using Rt UE placing rings on pegs close SBA; up/down 4 inch step LT LE to facilitate terminal knee /hip extension in stance using RW support, pt required mod to max tactile cues to complete full knee extension on left up step 2 X 10; gait 80 feet RW + hand splint left min to close SBA with mod to max cues for increased step length on left. Pt easily distracted from task > to right.  1500-1545 (45 minutes) Pain: no complaint of pain Focus of treatment: Therapeutic activity focused on sustained attention to task; gait training/endurance Treatment: Pt to duplicate picture using pipe tree. Pt required min/mod vcs to stay on task but completed design without difficulty; gait 80 feet RW + hand splint Left min to SBA with mod/max vcs to increase step length on left and stay closer to AD;.  Jed Kutch,JIM 07/26/2012, 8:03 AM

## 2012-07-26 NOTE — Progress Notes (Signed)
Pt's wanted her iv out, it was almost outdated and she refused a restart. Kinte Trim, rn

## 2012-07-26 NOTE — Progress Notes (Signed)
Patient ID: [name], female   DOB: Jan 26, 1951, 61 y.o.   MRN: [id number]  Subjective/Complaints: Frequent and urgent urination no burning UA - Objective: Vital Signs: Blood pressure 170/89, pulse 57, temperature 98.1 F (36.7 C), temperature source Oral, resp. rate 18, SpO2 100.00%. No results found. Results for orders placed during the hospital encounter of 07/22/12 (from the past 72 hour(s))  GLUCOSE, CAPILLARY     Status: Abnormal   Collection Time   07/23/12 11:38 AM      Component Value Range Comment   Glucose-Capillary 137 (*) 70 - 99 mg/dL   GLUCOSE, CAPILLARY     Status: Abnormal   Collection Time   07/23/12  4:49 PM      Component Value Range Comment   Glucose-Capillary 143 (*) 70 - 99 mg/dL    Comment 1 Notify RN     GLUCOSE, CAPILLARY     Status: Abnormal   Collection Time   07/23/12  8:46 PM      Component Value Range Comment   Glucose-Capillary 132 (*) 70 - 99 mg/dL    Comment 1 Notify RN     GLUCOSE, CAPILLARY     Status: Abnormal   Collection Time   07/24/12  7:31 AM      Component Value Range Comment   Glucose-Capillary 122 (*) 70 - 99 mg/dL   GLUCOSE, CAPILLARY     Status: Abnormal   Collection Time   07/24/12 11:58 AM      Component Value Range Comment   Glucose-Capillary 123 (*) 70 - 99 mg/dL   URINALYSIS, ROUTINE W REFLEX MICROSCOPIC     Status: Normal   Collection Time   07/24/12 12:07 PM      Component Value Range Comment   Color, Urine YELLOW  YELLOW    APPearance CLEAR  CLEAR    Specific Gravity, Urine 1.014  1.005 - 1.030    pH 7.0  5.0 - 8.0    Glucose, UA NEGATIVE  NEGATIVE mg/dL    Hgb urine dipstick NEGATIVE  NEGATIVE    Bilirubin Urine NEGATIVE  NEGATIVE    Ketones, ur NEGATIVE  NEGATIVE mg/dL    Protein, ur NEGATIVE  NEGATIVE mg/dL    Urobilinogen, UA 0.2  0.0 - 1.0 mg/dL    Nitrite NEGATIVE  NEGATIVE    Leukocytes, UA NEGATIVE  NEGATIVE MICROSCOPIC NOT DONE ON URINES WITH NEGATIVE PROTEIN, BLOOD, LEUKOCYTES, NITRITE, OR GLUCOSE  <1000 mg/dL.  GLUCOSE, CAPILLARY     Status: Abnormal   Collection Time   07/24/12  4:32 PM      Component Value Range Comment   Glucose-Capillary 130 (*) 70 - 99 mg/dL    Comment 1 Notify RN     GLUCOSE, CAPILLARY     Status: Abnormal   Collection Time   07/24/12  9:39 PM      Component Value Range Comment   Glucose-Capillary 140 (*) 70 - 99 mg/dL    Comment 1 Notify RN     GLUCOSE, CAPILLARY     Status: Abnormal   Collection Time   07/25/12  8:54 AM      Component Value Range Comment   Glucose-Capillary 147 (*) 70 - 99 mg/dL   GLUCOSE, CAPILLARY     Status: Abnormal   Collection Time   07/25/12 11:56 AM      Component Value Range Comment   Glucose-Capillary 129 (*) 70 - 99 mg/dL   GLUCOSE, CAPILLARY     Status: Normal  Collection Time   07/25/12  4:48 PM      Component Value Range Comment   Glucose-Capillary 92  70 - 99 mg/dL   GLUCOSE, CAPILLARY     Status: Abnormal   Collection Time   07/25/12  9:03 PM      Component Value Range Comment   Glucose-Capillary 114 (*) 70 - 99 mg/dL      HEENT: normal Cardio: RRR Resp: CTA B/L GI: BS positive Extremity:  No Edema Skin:   Intact Neuro: Alert/Oriented, Cranial Nerve II-XII normal, Normal Sensory, Abnormal Motor R side 5/5, L side 1/5 distal, 2-/5 prox UE, 3/5 LLE and Abnormal FMC Ataxic/ dec FMC Musc/Skel:  Normal   Assessment/Plan: 1. 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 Physiatrist is providing close team supervision and 24 hour management of active medical problems listed below. Physiatrist and rehab team continue to assess barriers to discharge/monitor patient progress toward functional and medical goals. FIM: FIM - Bathing Bathing Steps Patient Completed: Chest;Right Arm;Left Arm;Abdomen;Front perineal area;Buttocks;Right upper leg;Left upper leg Bathing: 4: Min-Patient completes  8-9 14f 10 parts or 75+ percent  FIM - Upper Body Dressing/Undressing Upper body dressing/undressing steps patient completed: Thread/unthread right sleeve of pullover shirt/dresss;Put head through opening of pull over shirt/dress;Pull shirt over trunk Upper body dressing/undressing: 4: Min-Patient completed 75 plus % of tasks FIM - Lower Body Dressing/Undressing Lower body dressing/undressing steps patient completed: Pull underwear up/down Lower body dressing/undressing: 0: Wears gown/pajamas-no public clothing  FIM - Toileting Toileting steps completed by patient: Performs perineal hygiene Toileting: 2: Max-Patient completed 1 of 3 steps  FIM - Diplomatic Services operational officer Devices: Grab bars Toilet Transfers: 4-To toilet/BSC: Min A (steadying Pt. > 75%);4-From toilet/BSC: Min A (steadying Pt. > 75%)  FIM - Banker Devices: Walker;Arm rests;Bed rails Bed/Chair Transfer: 3: Supine > Sit: Mod A (lifting assist/Pt. 50-74%/lift 2 legs;4: Bed > Chair or W/C: Min A (steadying Pt. > 75%);4: Chair or W/C > Bed: Min A (steadying Pt. > 75%)  FIM - Locomotion: Wheelchair Locomotion: Wheelchair: 1: Total Assistance/staff pushes wheelchair (Pt<25%) FIM - Locomotion: Ambulation Locomotion: Ambulation Assistive Devices: Walker - Rolling;Other (comment) (HHA) Ambulation/Gait Assistance: 4: Min assist;3: Mod assist (Mod with HHA only) Locomotion: Ambulation: 2: Travels 50 - 149 ft with minimal assistance (Pt.>75%)  Comprehension Comprehension Mode: Auditory Comprehension: 7-Follows complex conversation/direction: With no assist  Expression Expression Mode: Verbal Expression: 7-Expresses complex ideas: With no assist  Social Interaction Social Interaction: 7-Interacts appropriately with others - No medications needed.  Problem Solving Problem Solving: 5-Solves basic problems: With no assist  Memory Memory: 6-More than reasonable amt of  time  Medical Problem List and Plan:  1. DVT Prophylaxis/Anticoagulation: Pharmaceutical: Lovenox  2. Pain Management: started on neurontin for left shoulder pain. Add ice prn. Will monitor.  3. Mood: continue celexa and effexor daily. LCSW to follow for formal evaluation.  4. Neuropsych: This patient is capable of making decisions on his/her own behalf.  5. CAD: On plavix and lopressor. Lipitor added at this admission.  6. DM type 2: monitor with ac/hs CBG checks. Resume metformin bid as CT/MRI done without contrast.  7. HTN; monitor with bid checks. Continue cozaar and lopressor 8.  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

## 2012-07-27 ENCOUNTER — Inpatient Hospital Stay (HOSPITAL_COMMUNITY): Payer: BC Managed Care – PPO

## 2012-07-27 ENCOUNTER — Inpatient Hospital Stay (HOSPITAL_COMMUNITY): Payer: BC Managed Care – PPO | Admitting: Occupational Therapy

## 2012-07-27 ENCOUNTER — Inpatient Hospital Stay (HOSPITAL_COMMUNITY): Payer: BC Managed Care – PPO | Admitting: Physical Therapy

## 2012-07-27 ENCOUNTER — Inpatient Hospital Stay (HOSPITAL_COMMUNITY): Payer: BC Managed Care – PPO | Admitting: Speech Pathology

## 2012-07-27 DIAGNOSIS — Z5189 Encounter for other specified aftercare: Secondary | ICD-10-CM

## 2012-07-27 DIAGNOSIS — I6330 Cerebral infarction due to thrombosis of unspecified cerebral artery: Secondary | ICD-10-CM

## 2012-07-27 DIAGNOSIS — G8110 Spastic hemiplegia affecting unspecified side: Secondary | ICD-10-CM

## 2012-07-27 LAB — GLUCOSE, CAPILLARY: Glucose-Capillary: 143 mg/dL — ABNORMAL HIGH (ref 70–99)

## 2012-07-27 NOTE — Progress Notes (Signed)
Patient ID: [name], female   DOB: Jan 01, 1951, 61 y.o.   MRN: [id number]  Subjective/Complaints: Frequent and urgent urination no burning, ordered oxybutnin yesterday UA - Objective: Vital Signs: Blood pressure 158/74, pulse 58, temperature 98 F (36.7 C), temperature source Oral, resp. rate 17, weight 90.4 kg (199 lb 4.7 oz), SpO2 95.00%. No results found. Results for orders placed during the hospital encounter of 07/22/12 (from the past 72 hour(s))  GLUCOSE, CAPILLARY     Status: Abnormal   Collection Time   07/24/12 11:58 AM      Component Value Range Comment   Glucose-Capillary 123 (*) 70 - 99 mg/dL   URINALYSIS, ROUTINE W REFLEX MICROSCOPIC     Status: Normal   Collection Time   07/24/12 12:07 PM      Component Value Range Comment   Color, Urine YELLOW  YELLOW    APPearance CLEAR  CLEAR    Specific Gravity, Urine 1.014  1.005 - 1.030    pH 7.0  5.0 - 8.0    Glucose, UA NEGATIVE  NEGATIVE mg/dL    Hgb urine dipstick NEGATIVE  NEGATIVE    Bilirubin Urine NEGATIVE  NEGATIVE    Ketones, ur NEGATIVE  NEGATIVE mg/dL    Protein, ur NEGATIVE  NEGATIVE mg/dL    Urobilinogen, UA 0.2  0.0 - 1.0 mg/dL    Nitrite NEGATIVE  NEGATIVE    Leukocytes, UA NEGATIVE  NEGATIVE MICROSCOPIC NOT DONE ON URINES WITH NEGATIVE PROTEIN, BLOOD, LEUKOCYTES, NITRITE, OR GLUCOSE <1000 mg/dL.  GLUCOSE, CAPILLARY     Status: Abnormal   Collection Time   07/24/12  4:32 PM      Component Value Range Comment   Glucose-Capillary 130 (*) 70 - 99 mg/dL    Comment 1 Notify RN     GLUCOSE, CAPILLARY     Status: Abnormal   Collection Time   07/24/12  9:39 PM      Component Value Range Comment   Glucose-Capillary 140 (*) 70 - 99 mg/dL    Comment 1 Notify RN     GLUCOSE, CAPILLARY     Status: Abnormal   Collection Time   07/25/12  8:54 AM      Component Value Range Comment   Glucose-Capillary 147 (*) 70 - 99 mg/dL   GLUCOSE, CAPILLARY     Status: Abnormal   Collection Time   07/25/12 11:56 AM   Component Value Range Comment   Glucose-Capillary 129 (*) 70 - 99 mg/dL   GLUCOSE, CAPILLARY     Status: Normal   Collection Time   07/25/12  4:48 PM      Component Value Range Comment   Glucose-Capillary 92  70 - 99 mg/dL   GLUCOSE, CAPILLARY     Status: Abnormal   Collection Time   07/25/12  9:03 PM      Component Value Range Comment   Glucose-Capillary 114 (*) 70 - 99 mg/dL   GLUCOSE, CAPILLARY     Status: Abnormal   Collection Time   07/26/12  8:35 AM      Component Value Range Comment   Glucose-Capillary 207 (*) 70 - 99 mg/dL   GLUCOSE, CAPILLARY     Status: Abnormal   Collection Time   07/26/12 11:39 AM      Component Value Range Comment   Glucose-Capillary 134 (*) 70 - 99 mg/dL   GLUCOSE, CAPILLARY     Status: Normal   Collection Time   07/26/12  4:20 PM  Component Value Range Comment   Glucose-Capillary 86  70 - 99 mg/dL   GLUCOSE, CAPILLARY     Status: Abnormal   Collection Time   07/26/12  9:23 PM      Component Value Range Comment   Glucose-Capillary 108 (*) 70 - 99 mg/dL      HEENT: normal Cardio: RRR Resp: CTA B/L GI: BS positive Extremity:  No Edema Skin:   Intact Neuro: Alert/Oriented, Cranial Nerve II-XII normal, Normal Sensory, Abnormal Motor R side 5/5, L side 1/5 distal, 2-/5 prox UE, 3/5 LLE and Abnormal FMC Ataxic/ dec FMC Musc/Skel:  Normal   Assessment/Plan: 1. 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 Physiatrist is providing close team supervision and 24 hour management of active medical problems listed below. Physiatrist and rehab team continue to assess barriers to discharge/monitor patient progress toward functional and medical goals. FIM: FIM - Bathing Bathing Steps Patient Completed: Chest;Left Arm;Abdomen;Front perineal area;Left upper leg;Right upper leg Bathing: 3: Mod-Patient completes 5-7 31f 10 parts or  50-74%  FIM - Upper Body Dressing/Undressing Upper body dressing/undressing steps patient completed: Put head through opening of pull over shirt/dress;Pull shirt over trunk;Thread/unthread right sleeve of pullover shirt/dresss Upper body dressing/undressing: 4: Min-Patient completed 75 plus % of tasks FIM - Lower Body Dressing/Undressing Lower body dressing/undressing steps patient completed: Pull underwear up/down;Thread/unthread right underwear leg;Thread/unthread left underwear leg;Thread/unthread right pants leg;Thread/unthread left pants leg;Pull pants up/down Lower body dressing/undressing: 3: Mod-Patient completed 50-74% of tasks  FIM - Toileting Toileting steps completed by patient: Performs perineal hygiene Toileting: 2: Max-Patient completed 1 of 3 steps  FIM - Diplomatic Services operational officer Devices: Grab bars Toilet Transfers: 4-To toilet/BSC: Min A (steadying Pt. > 75%);4-From toilet/BSC: Min A (steadying Pt. > 75%)  FIM - Banker Devices: Walker;Arm rests;Bed rails Bed/Chair Transfer: 3: Supine > Sit: Mod A (lifting assist/Pt. 50-74%/lift 2 legs;4: Bed > Chair or W/C: Min A (steadying Pt. > 75%);4: Chair or W/C > Bed: Min A (steadying Pt. > 75%)  FIM - Locomotion: Wheelchair Locomotion: Wheelchair: 1: Total Assistance/staff pushes wheelchair (Pt<25%) FIM - Locomotion: Ambulation Locomotion: Ambulation Assistive Devices: Walker - Rolling;Other (comment) (HHA) Ambulation/Gait Assistance: 4: Min assist;3: Mod assist (Mod with HHA only) Locomotion: Ambulation: 2: Travels 50 - 149 ft with minimal assistance (Pt.>75%)  Comprehension Comprehension Mode: Auditory Comprehension: 7-Follows complex conversation/direction: With no assist  Expression Expression Mode: Verbal Expression: 6-Expresses complex ideas: With extra time/assistive device  Social Interaction Social Interaction: 6-Interacts appropriately with others with  medication or extra time (anti-anxiety, antidepressant).  Problem Solving Problem Solving: 5-Solves basic problems: With no assist  Memory Memory: 4-Recognizes or recalls 75 - 89% of the time/requires cueing 10 - 24% of the time  Medical Problem List and Plan:  1. DVT Prophylaxis/Anticoagulation: Pharmaceutical: Lovenox  2. Pain Management: started on neurontin for left shoulder pain. Add ice prn. Will monitor.  3. Mood: continue celexa and effexor daily. LCSW to follow for formal evaluation.  4. Neuropsych: This patient is capable of making decisions on his/her own behalf.  5. CAD: On plavix and lopressor. Lipitor added at this admission.  6. DM type 2: monitor with ac/hs CBG checks. Resume metformin bid as CT/MRI done without contrast.  7. HTN; monitor with bid checks. Continue cozaar and lopressor 8.  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

## 2012-07-27 NOTE — Progress Notes (Signed)
I have read and agree with the following treatment session.  Ellieana Dolecki Hall, PT, DPT 

## 2012-07-27 NOTE — Progress Notes (Signed)
Physical Therapy Session Note  Patient Details  Name: [name] MRN: [id number] Date of Birth: 07-18-1951  Today's Date: 07/27/2012 Time: 1600-1630 Time Calculation (min): 30 min  Short Term Goals: Week 1:  PT Short Term Goal 1 (Week 1): Pt will perform bed<>chair transfers with S throuhout entire tx PT Short Term Goal 2 (Week 1): Pt will perform sit<>supine with S only PT Short Term Goal 3 (Week 1): Pt will ambulate 1' with RW and Min A PT Short Term Goal 4 (Week 1): Pt will propell WC x150' with S      Therapy Documentation Precautions:  Precautions Precautions: Fall Precaution Comments: left weakness UE>LE Restrictions Weight Bearing Restrictions: No   Pain: Pain Assessment Pain Assessment: No/denies pain Locomotion : Ambulation Ambulation: Yes Ambulation/Gait Assistance: 4: Min guard Ambulation Distance (Feet): 150 Feet          Other Treatments:   Patient found on edge of toilet upon entering room without assistance/supervision with signs of urgency/incontinence in w/c cushion. Patient reports she called for nursing but no one came. PT assisted patient with set up, patient performed personal hygiene. PT assisted patient with lower body dressing.  Patient ambulated from room > PT gym with HHA and min guard. Patient presents with decreased gait speed, decreased step length (L more so than R) resulting in decreased anterior progression, and difficulties maintaining balance. In gym patient performed alternating soccer ball kicks with emphasis on maintaining balance, upright posture, and single leg stance. Patient required mod to max assist. Task was progressed by having patient perform sideward, forward stepping in between kicks bilaterally with min/mod assist. Patient ended task by ambulating backward 10 feet with HHA and min assist. Patient performed mat > w/c stand pivot transfer with supervision at end of session and returned to room with quick release belt applied  in chair. Patient presented with decreased safety awareness throughout today's session.  See FIM for current functional status  Therapy/Group: Individual Therapy  Ivalene Platte [place] DPT Student  07/27/2012, 4:39 PM

## 2012-07-27 NOTE — Progress Notes (Signed)
Occupational Therapy Session Note  Patient Details  Name: [name] MRN: [id number] Date of Birth: 01-Aug-1951  Today's Date: 07/27/2012 Time: 0900-1000 Time Calculation (min): 60 min  Short Term Goals: Week 1:  OT Short Term Goal 1 (Week 1): Pt will ambulate to toilet with min assist with RW. OT Short Term Goal 2 (Week 1): Pt will don shirt with set up. OT Short Term Goal 3 (Week 1): Pt will don pants with min assist. OT Short Term Goal 4 (Week 1): Pt will stand at sink to brush teeth with supervision. OT Short Term Goal 5 (Week 1): Pt will transfer to shower chair with min assist.     Skilled Therapeutic Interventions/Progress Updates:      Pt seen for BADL retraining of toileting, bathing, and dressing with a focus on functional mobility and use of LUE.  Pt attempted to hold washcloth in her left hand to bathe right arm. She needed mod assist to move washcloth up and down arm.  When pt stood in shower she had a posterior lean in which she lost her balance backwards numerous times, but each time she very quickly grabbed the grab bar to self correct without cues. Pt will need a long sponge to increase independence with bathing.  Therapy Documentation Precautions:  Precautions Precautions: Fall Precaution Comments: left weakness UE>LE Restrictions Weight Bearing Restrictions: No   Pain: Pain Assessment Pain Assessment: No/denies pain ADL:  See FIM for current functional status  Therapy/Group: Individual Therapy  SAGUIER,JULIA 07/27/2012, 11:54 AM

## 2012-07-27 NOTE — Progress Notes (Signed)
Speech Language Pathology Daily Session Note  Patient Details  Name: [name] MRN: [id number] Date of Birth: 15-Jan-1951  Today's Date: 07/27/2012 Time: 1345-1430 Time Calculation (min): 45 min  Short Term Goals: Week 1: SLP Short Term Goal 1 (Week 1): Patient will recall steps of a transfer sequence as taught by PT/OT with supervision verbal cues SLP Short Term Goal 2 (Week 1): Patient will recall new medication name and functions with supervision vebral cues to utilize external aids SLP Short Term Goal 3 (Week 1): Patient will solve complex problems with supervision level verbal cues  Skilled Therapeutic Interventions: Treatment session focused on addressing cognition during mobility, a meal planning and medication management activity.  SLP facilitated session with max faded to mod assist verbal and visual cues to recall and perform sit to stand sequence with use of walker.  SLP also facilitated session with min assist verbal cues to safely utilize walker during session where she was locating items in the kitchen to assist with meal planning.  SLP educated patient and generated a memory aid with names, functions and frequency of current medications, which will be utilize in next session.    FIM:  Comprehension Comprehension Mode: Auditory Comprehension: 6-Follows complex conversation/direction: With extra time/assistive device Expression Expression Mode: Verbal Expression: 6-Expresses complex ideas: With extra time/assistive device Social Interaction Social Interaction: 6-Interacts appropriately with others with medication or extra time (anti-anxiety, antidepressant). Problem Solving Problem Solving: 3-Solves basic 50 - 74% of the time/requires cueing 25 - 49% of the time Memory Memory: 3-Recognizes or recalls 50 - 74% of the time/requires cueing 25 - 49% of the time  Pain Pain Assessment Pain Assessment: No/denies pain  Therapy/Group: Individual Therapy  Charlane Ferretti., CCC-SLP [phone]  [name] 07/27/2012, 3:05 PM

## 2012-07-27 NOTE — Progress Notes (Signed)
Physical Therapy Session Note  Patient Details  Name: [name] MRN: [id number] Date of Birth: 16-May-1951  Today's Date: 07/27/2012 Time: 1110-1210 Time Calculation (min): 60 min  Short Term Goals: Week 1:  PT Short Term Goal 1 (Week 1): Pt will perform bed<>chair transfers with S throuhout entire tx PT Short Term Goal 2 (Week 1): Pt will perform sit<>supine with S only PT Short Term Goal 3 (Week 1): Pt will ambulate 65' with RW and Min A PT Short Term Goal 4 (Week 1): Pt will propell WC x150' with S  Skilled Therapeutic Interventions/Progress Updates:   Gait training in controlled env x 150' x 1 with close supervision, RW with L hand orthosis, VCs for sufficient L hip flexion to clear foot.  Gait on carpet with audible rubbing of L foot due to poor foot clearance, VCs to flex hip and knee.  Pt has L inattention, R environmental distractibility when ambulating; with VCs she is able to attend to L.  Standing balance to facilitate ankle and hip strategy during toes up/down with LOB 7/10  trials, backwards.  Marching in place with VCs to increase hip flexion, 10 x 1.  L sidestepping using RW.  Land R hip closed chain strengthening, nudging Buso ball with lateral border of foot, mod assist for balance.  In sitting, legs crossed at ankle, neuromuscular re-education for trunk shortening/lengthening L and R trunk with lateral reaching, extra time to initiate when reaching and grossly grasping to L.  Gait back to room, using RW, x 200, with supervision to min assist for LOB as pt was encouraged to increase gait speed.  As she fatigued, she had less clearance of L foot, but continued at same speed.     Therapy Documentation Precautions:  Precautions Precautions: Fall Precaution Comments: left weakness UE>LE Restrictions Weight Bearing Restrictions: No   Pain: Pain Assessment Pain Assessment: No/denies pain     See FIM for current functional status  Therapy/Group: Individual  Therapy  Kevionna Heffler 07/27/2012, 12:29 PM

## 2012-07-28 ENCOUNTER — Inpatient Hospital Stay (HOSPITAL_COMMUNITY): Payer: BC Managed Care – PPO

## 2012-07-28 ENCOUNTER — Inpatient Hospital Stay (HOSPITAL_COMMUNITY): Payer: BC Managed Care – PPO | Admitting: Occupational Therapy

## 2012-07-28 ENCOUNTER — Inpatient Hospital Stay (HOSPITAL_COMMUNITY): Payer: BC Managed Care – PPO | Admitting: Speech Pathology

## 2012-07-28 LAB — GLUCOSE, CAPILLARY
Glucose-Capillary: 118 mg/dL — ABNORMAL HIGH (ref 70–99)
Glucose-Capillary: 188 mg/dL — ABNORMAL HIGH (ref 70–99)

## 2012-07-28 NOTE — Progress Notes (Signed)
Occupational Therapy Session Note  Patient Details  Name: [name] MRN: [id number] Date of Birth: 03-29-1951  Today's Date: 07/28/2012 Time: 0900-1005 Time Calculation (min): 65 min  Short Term Goals: Week 1:  OT Short Term Goal 1 (Week 1): Pt will ambulate to toilet with min assist with RW. OT Short Term Goal 2 (Week 1): Pt will don shirt with set up. OT Short Term Goal 3 (Week 1): Pt will don pants with min assist. OT Short Term Goal 4 (Week 1): Pt will stand at sink to brush teeth with supervision. OT Short Term Goal 5 (Week 1): Pt will transfer to shower chair with min assist.  Skilled Therapeutic Interventions/Progress Updates:    Pt seen for ADL retraining with bathing at shower level and dressing with a focus on functional mobility and use of LUE.  Pt provided with long sponge to increase independence with bathing.  She was able to hold washcloth in left hand and partially wash right arm.  She actively used LUE with donning UB clothing.  Increased grasp arom and strength (2/5 versus 1/5).  Donned pants over feet without assist.   Pt also worked on Ship broker therapy to stimulate movement in her left hand.  At the end of the treatment she demonstrated 2+/5 grasp which was an improvement from the start of the session.  Therapy Documentation Precautions:  Precautions Precautions: Fall Precaution Comments: left weakness UE>LE Restrictions Weight Bearing Restrictions: No   Pain: Pain Assessment Pain Assessment: No/denies pain ADL:  See FIM for current functional status  Therapy/Group: Individual Therapy  Yesena Reaves 07/28/2012, 11:27 AM

## 2012-07-28 NOTE — Progress Notes (Signed)
Patient ID: [name], female   DOB: Jul 06, 1951, 61 y.o.   MRN: [id number]  Subjective/Complaints: Frequent and urgent urination no burning, ordered oxybutnin 2 days ago UA - Objective: Vital Signs: Blood pressure 165/78, pulse 50, temperature 97.6 F (36.4 C), temperature source Oral, resp. rate 20, weight 90.4 kg (199 lb 4.7 oz), SpO2 100.00%. No results found. Results for orders placed during the hospital encounter of 07/22/12 (from the past 72 hour(s))  GLUCOSE, CAPILLARY     Status: Abnormal   Collection Time   07/25/12  8:54 AM      Component Value Range Comment   Glucose-Capillary 147 (*) 70 - 99 mg/dL   GLUCOSE, CAPILLARY     Status: Abnormal   Collection Time   07/25/12 11:56 AM      Component Value Range Comment   Glucose-Capillary 129 (*) 70 - 99 mg/dL   GLUCOSE, CAPILLARY     Status: Normal   Collection Time   07/25/12  4:48 PM      Component Value Range Comment   Glucose-Capillary 92  70 - 99 mg/dL   GLUCOSE, CAPILLARY     Status: Abnormal   Collection Time   07/25/12  9:03 PM      Component Value Range Comment   Glucose-Capillary 114 (*) 70 - 99 mg/dL   GLUCOSE, CAPILLARY     Status: Abnormal   Collection Time   07/26/12  8:35 AM      Component Value Range Comment   Glucose-Capillary 207 (*) 70 - 99 mg/dL   GLUCOSE, CAPILLARY     Status: Abnormal   Collection Time   07/26/12 11:39 AM      Component Value Range Comment   Glucose-Capillary 134 (*) 70 - 99 mg/dL   GLUCOSE, CAPILLARY     Status: Normal   Collection Time   07/26/12  4:20 PM      Component Value Range Comment   Glucose-Capillary 86  70 - 99 mg/dL   GLUCOSE, CAPILLARY     Status: Abnormal   Collection Time   07/26/12  9:23 PM      Component Value Range Comment   Glucose-Capillary 108 (*) 70 - 99 mg/dL   GLUCOSE, CAPILLARY     Status: Abnormal   Collection Time   07/27/12 12:15 PM      Component Value Range Comment   Glucose-Capillary 111 (*) 70 - 99 mg/dL   GLUCOSE, CAPILLARY     Status:  Abnormal   Collection Time   07/27/12  4:53 PM      Component Value Range Comment   Glucose-Capillary 143 (*) 70 - 99 mg/dL   GLUCOSE, CAPILLARY     Status: Normal   Collection Time   07/27/12  9:47 PM      Component Value Range Comment   Glucose-Capillary 74  70 - 99 mg/dL   GLUCOSE, CAPILLARY     Status: Abnormal   Collection Time   07/28/12  7:32 AM      Component Value Range Comment   Glucose-Capillary 124 (*) 70 - 99 mg/dL      HEENT: normal Cardio: RRR Resp: CTA B/L GI: BS positive Extremity:  No Edema Skin:   Intact Neuro: Alert/Oriented, Cranial Nerve II-XII normal, Normal Sensory, Abnormal Motor R side 5/5, L side 1/5 distal, 2-/5 prox UE, 3/5 LLE and Abnormal FMC Ataxic/ dec FMC Musc/Skel:  Normal   Assessment/Plan: 1. Functional deficits secondary to R MCA infarct with LUE>> LLE weakness which  require 3+ hours per day of interdisciplinary therapy in a comprehensive inpatient rehab setting. Physiatrist is providing close team supervision and 24 hour management of active medical problems listed below. Physiatrist and rehab team continue to assess barriers to discharge/monitor patient progress toward functional and medical goals. FIM: FIM - Bathing Bathing Steps Patient Completed: Chest;Left Arm;Abdomen;Front perineal area;Left upper leg;Right upper leg Bathing: 3: Mod-Patient completes 5-7 46f 10 parts or 50-74%  FIM - Upper Body Dressing/Undressing Upper body dressing/undressing steps patient completed: Put head through opening of pull over shirt/dress;Pull shirt over trunk;Thread/unthread right sleeve of pullover shirt/dresss Upper body dressing/undressing: 4: Min-Patient completed 75 plus % of tasks FIM - Lower Body Dressing/Undressing Lower body dressing/undressing steps patient completed: Pull underwear up/down;Thread/unthread right underwear leg;Thread/unthread left underwear leg;Thread/unthread right pants leg;Thread/unthread left pants leg;Pull pants  up/down Lower body dressing/undressing: 3: Mod-Patient completed 50-74% of tasks  FIM - Toileting Toileting steps completed by patient: Performs perineal hygiene Toileting: 2: Max-Patient completed 1 of 3 steps  FIM - Diplomatic Services operational officer Devices: Grab bars Toilet Transfers: 4-To toilet/BSC: Min A (steadying Pt. > 75%);4-From toilet/BSC: Min A (steadying Pt. > 75%)  FIM - Banker Devices: Walker;Arm rests;Bed rails Bed/Chair Transfer: 5: Bed > Chair or W/C: Supervision (verbal cues/safety issues)  FIM - Locomotion: Wheelchair Locomotion: Wheelchair: 1: Total Assistance/staff pushes wheelchair (Pt<25%) FIM - Locomotion: Ambulation Locomotion: Ambulation Assistive Devices:  (HHA) Ambulation/Gait Assistance: 4: Min guard Locomotion: Ambulation: 4: Travels 150 ft or more with minimal assistance (Pt.>75%)  Comprehension Comprehension Mode: Auditory Comprehension: 6-Follows complex conversation/direction: With extra time/assistive device  Expression Expression Mode: Verbal Expression: 6-Expresses complex ideas: With extra time/assistive device  Social Interaction Social Interaction: 6-Interacts appropriately with others with medication or extra time (anti-anxiety, antidepressant).  Problem Solving Problem Solving: 3-Solves basic 50 - 74% of the time/requires cueing 25 - 49% of the time  Memory Memory: 3-Recognizes or recalls 50 - 74% of the time/requires cueing 25 - 49% of the time  Medical Problem List and Plan:  1. DVT Prophylaxis/Anticoagulation: Pharmaceutical: Lovenox  2. Pain Management: started on neurontin for left shoulder pain. Add ice prn. Will monitor.  3. Mood: continue celexa and effexor daily. LCSW to follow for formal evaluation.  4. Neuropsych: This patient is capable of making decisions on his/her own behalf.  5. CAD: On plavix and lopressor. Lipitor added at this admission.  6. DM type 2: monitor  with ac/hs CBG checks. Resume metformin bid as CT/MRI done without contrast.  7. HTN; monitor with bid checks. Continue cozaar and lopressor 8.  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

## 2012-07-28 NOTE — Progress Notes (Signed)
Social Work Patient ID: [name], female   DOB: [birth year], 61 y.o.   MRN: [id number] Met with pt to inform team conference goals-supervision level and discharge 10/14.  Pt feels she is doing ok but concerned no one is there with her at home, While husband works.  Informed her insurance does not pay to have someone there with.  Discussed options ie, NHP, hired assist and other family members. Pt reports she does not want to go to a NH her Dad was in one.  She may go to Mom's home in [place] where there is family members there.  She will discuss With her husband to come up with the best option. Continue to work on a safe discharge plan.

## 2012-07-28 NOTE — Progress Notes (Signed)
Occupational Therapy Session Note  Patient Details  Name: [name] MRN: [id number] Date of Birth: 07/21/1951  Today's Date: 07/28/2012 Time: 1440-1530 Time Calculation (min): 50 min  Short Term Goals: Week 1:  OT Short Term Goal 1 (Week 1): Pt will ambulate to toilet with min assist with RW. OT Short Term Goal 2 (Week 1): Pt will don shirt with set up. OT Short Term Goal 3 (Week 1): Pt will don pants with min assist. OT Short Term Goal 4 (Week 1): Pt will stand at sink to brush teeth with supervision. OT Short Term Goal 5 (Week 1): Pt will transfer to shower chair with min assist.  Skilled Therapeutic Interventions/Progress Updates:    Session focused on neuromuscular reeducation of the L UE using mirror therapy and simulated self feeding task. Pt engaged in mirror therapy treatment performing finger flex/ext, thumb to index finger opening/closing (pad-to-pad pinch), and supination/pronation. Pt able to engage in session ~30 min with Max verbal cues for instruction and concentration. Pt noted to have slight increase in AROM when L UE was inside box and in L grip strength after concluding session. Discussed and practiced using fork with built-up handle for increased L hand grip due to pt c/o difficulty cutting foods; Pt requires Max verbal and tactile cues to maintain L grip. Left theraputty and adapted fork in room for pt to practice.  Therapy Documentation Precautions:  Precautions Precautions: Fall Precaution Comments: left weakness UE>LE Restrictions Weight Bearing Restrictions: No  Pain: Pain Assessment Pain Assessment: No/denies pain  See FIM for current functional status  Therapy/Group: Individual Therapy  Jackelyn Poling 07/28/2012, 4:29 PM

## 2012-07-28 NOTE — Progress Notes (Signed)
Speech Language Pathology Daily Session Note  Patient Details  Name: [name] MRN: [id number] Date of Birth: [birth year]  Today's Date: 07/28/2012 Time: 1440-1530 Time Calculation (min): 50 min  Short Term Goals: Week 1: SLP Short Term Goal 1 (Week 1): Patient will recall steps of a transfer sequence as taught by PT/OT with supervision verbal cues SLP Short Term Goal 2 (Week 1): Patient will recall new medication name and functions with supervision vebral cues to utilize external aids SLP Short Term Goal 3 (Week 1): Patient will solve complex problems with supervision level verbal cues  Skilled Therapeutic Interventions: Treatment session focused on addressing cognition during daily functional tasks.  SLP facilitated session with max faded to mod assist verbal and visual cues to recall and perform sit to stand sequence with use of walker. SLP also facilitated session with min assist verbal cues to safely utilize walker during session where she was locating, reaching for and organizing food items for cooking task tomorrow.     Pain Pain Assessment Pain Assessment: No/denies pain  Therapy/Group: Individual Therapy  Charlane Ferretti., CCC-SLP [phone]  [name] 07/28/2012, 5:17 PM

## 2012-07-28 NOTE — Patient Care Conference (Signed)
Inpatient RehabilitationTeam Conference Note Date: 07/28/2012   Time: 10:30 AM    Patient Name: [name]      Medical Record Number: [id number]  Date of Birth: Oct 06, 1951 Sex: Female         Room/Bed: 4036/4036-01 Payor Info: Payor: BLUE CROSS BLUE [employer]IELD  Plan: BCBS PPO OUT OF STATE  Product Type: *No Product type*     Admitting Diagnosis: RT CVA  Admit Date/Time:  07/22/2012  7:42 PM Admission Comments: No comment available   Primary Diagnosis:  CVA (cerebral infarction) Principal Problem: CVA (cerebral infarction)  Patient Active Problem List   Diagnosis Date Noted  . Stroke 07/21/2012  . CVA (cerebral infarction) 07/21/2012  . Left-sided weakness 07/19/2012  . HTN (hypertension) 07/19/2012  . HLD (hyperlipidemia) 07/19/2012  . Depression 07/19/2012  . Diabetes mellitus 07/19/2012  . ALLERGIC RHINITIS 01/21/2008  . AMI 11/04/2007  . BRONCHITIS 11/04/2007  . HYPERSOMNIA, ASSOCIATED WITH SLEEP APNEA 11/04/2007  . MUSCULOSKELETAL PAIN 11/04/2007  . NEPHRECTOMY, HX OF 11/04/2007  . Other Acquired Absence of Organ 11/04/2007    Expected Discharge Date: Expected Discharge Date: 08/09/12  Team Members Present: Physician: Dr. Claudette Laws Nurse Present: Laural Roes, RN PT Present: Edman Circle, PT;Other (comment) [place] Citizen Cook-PT) OT Present: Bretta Bang, Verlene Mayer, OT SLP Present: Fae Pippin, SLP     Current Status/Progress Goal Weekly Team Focus  Medical   Poor safety awareness  Improve safety  Reduce bladder urgency   Bowel/Bladder   Incontinent at times - bladder, continent of bowel  continent of bowel and bladder  Timed toileting q 3 hrs   Swallow/Nutrition/ Hydration             ADL's   mod assist with bathing, dressing; min with toileting and transfers  mod I with toileting and dressing, supervision with bathing, min assist with meal prep  balance, LUE neuro re-ed, functional mobility, pt education   Mobility   min assist basic  transfers, gait x 200', up/down 5 steps 2 rails; L inattention, distactible by R environment  modified independent basic transfers and w/c mobility x 150'; downgraded gait goal to supervision x 150' and up/down 5 steps  neuro re-ed, balance, attention to L, awareness, safety with L hand splint on RW   Communication             Safety/Cognition/ Behavioral Observations  min-mod  downgraded to supervision recommend 24/7 supervision upon discharge  increase left attention, safety awareness   Pain   No C/O pain, Ultram 50 mg q 6 hrs PRN, Tylenol 325- 650mg  q 4hrs PRN  Pain level < or = 3  monitor signs and symptoms of pain   Skin   No skin issues  Remain free from skin breakdown and infection  Monitor for skin breakdown      *See Interdisciplinary Assessment and Plan and progress notes for long and short-term goals  Barriers to Discharge: Does not have 24 7 supervision    Possible Resolutions to Barriers:  Improve safety so patient can be left alone for 4 hours per day    Discharge Planning/Teaching Needs:  Home with husband but may be there by herslef some of the time. Son there until 3:30      Team Discussion:  Making progress-safety concerns 24 hr supervision level recommended. Trying meds for urgency-coming up with a discharge plan.  Revisions to Treatment Plan:  None   Continued Need for Acute Rehabilitation Level of Care: The patient requires daily medical management  by a physician with specialized training in physical medicine and rehabilitation for the following conditions: Daily direction of a multidisciplinary physical rehabilitation program to ensure safe treatment while eliciting the highest outcome that is of practical value to the patient.: Yes Daily medical management of patient stability for increased activity during participation in an intensive rehabilitation regime.: Yes Daily analysis of laboratory values and/or radiology reports with any subsequent need for  medication adjustment of medical intervention for : Neurological problems  Dillion Stowers, Lemar Livings 07/28/2012, 2:57 PM

## 2012-07-28 NOTE — Progress Notes (Signed)
Physical Therapy Weekly Progress Note  Patient Details  Name: [name] MRN: [id number] Date of Birth: 20-Apr-1951  Today's Date: 07/28/2012 Time: 1130-1200 and 1335-1400 Time Calculation (min): 30 min and 25 min  Patient has met 1 of 4 short term goals.  W/c goal has been discontinued due to change in care plan.  Bed mobility is limited by pt's difficulty remembering sequence; transfer goal not met due to pt's inconsistent balance when fatigued.  Patient continues to demonstrate the following deficits: strength, muscle timing and sequencing, balance, awareness, attention and therefore will continue to benefit from skilled PT intervention to enhance overall performance with activity tolerance, balance, ability to compensate for deficits, functional use of  left upper extremity and left lower extremity, attention and awareness.  Patient demonstrates cognitive deficits including memory, awareness, and attention; supervision is recommended after d/c.  See revised long term goals.  PT Short Term Goals Week 1:  PT Short Term Goal 1 (Week 1): Pt will perform bed<>chair transfers with S throuhout entire tx PT Short Term Goal 1 - Progress (Week 1): Not met PT Short Term Goal 2 (Week 1): Pt will perform sit<>supine with S only PT Short Term Goal 2 - Progress (Week 1): Not met PT Short Term Goal 3 (Week 1): Pt will ambulate 62' with RW and Min A PT Short Term Goal 3 - Progress (Week 1): Met PT Short Term Goal 4 (Week 1): Pt will propell WC x150' with S PT Short Term Goal 4 - Progress (Week 1): Not met (not a focus of tx; pt will be safer without w/c)  Skilled Therapeutic Interventions/Progress Updates:   AM-  Recliner> RW with min assist for brief unsteadiness upon standing.  Gait training without AD, with HHA x 200' to/from gym, with min assistance for wt shifting.  Pt is very distractible to environment on her R, demonstrates inattention to L.  Gait training included divided attention of  read room numbers on her L.  She required mod cues to sustain attention to L.    Sit>< stand from high mat x   Berg score 30/56 = high risk for falls, close to 100%. See navigator.  PM-   Pt found lying in bed with feet including shoes, hanging off bed.  She reported that a nurse had helped her to bed; confirmed by NT.  neuromuscular re-education for trunk, L extremities for rolling R and supine> sit.  Pt required mod assist supine>  R sidelying > sit.  She tends to roll onto her back and is unable to bring wt over to side without VCs to initiate.  Pt needed VCs to use RW safely, tending to reach up with bil hands for RW before standing, and forgetting R hand in orthosis upon standing and sitting.   Advanced gait training using RW for obstacle course of stepping over 6 cones, x 2.  Pt bumped the canes with RW or L foot each time.  She was unable to remember techniques to sequence RW over the obstacles, and needed VCs q trial.       Therapy Documentation Precautions:  Precautions Precautions: Fall Precaution Comments: left weakness UE>LE Restrictions Weight Bearing Restrictions: No   Pain: Pain Assessment Pain Assessment: No/denies pain     See FIM for current functional status  Therapy/Group: Individual Therapy  Eladia Frame 07/28/2012, 4:05 PM

## 2012-07-28 NOTE — Progress Notes (Signed)
Note reviewed and accurately reflects treatment session.   

## 2012-07-29 ENCOUNTER — Inpatient Hospital Stay (HOSPITAL_COMMUNITY): Payer: BC Managed Care – PPO | Admitting: Occupational Therapy

## 2012-07-29 ENCOUNTER — Inpatient Hospital Stay (HOSPITAL_COMMUNITY): Payer: BC Managed Care – PPO | Admitting: Speech Pathology

## 2012-07-29 ENCOUNTER — Inpatient Hospital Stay (HOSPITAL_COMMUNITY): Payer: BC Managed Care – PPO

## 2012-07-29 DIAGNOSIS — G8110 Spastic hemiplegia affecting unspecified side: Secondary | ICD-10-CM

## 2012-07-29 DIAGNOSIS — Z5189 Encounter for other specified aftercare: Secondary | ICD-10-CM

## 2012-07-29 DIAGNOSIS — I6330 Cerebral infarction due to thrombosis of unspecified cerebral artery: Secondary | ICD-10-CM

## 2012-07-29 LAB — GLUCOSE, CAPILLARY
Glucose-Capillary: 71 mg/dL (ref 70–99)
Glucose-Capillary: 108 mg/dL — ABNORMAL HIGH (ref 70–99)

## 2012-07-29 MED ORDER — SORBITOL 70 % SOLN
45.0000 mL | Freq: Once | Status: AC
  Administered 2012-07-29: 45 mL via ORAL
  Filled 2012-07-29: qty 60

## 2012-07-29 MED ORDER — POLYETHYLENE GLYCOL 3350 17 G PO PACK
17.0000 g | PACK | Freq: Every day | ORAL | Status: DC
  Administered 2012-08-01 – 2012-08-06 (×6): 17 g via ORAL
  Filled 2012-07-29 (×9): qty 1

## 2012-07-29 MED ORDER — HYDROCHLOROTHIAZIDE 12.5 MG PO CAPS
12.5000 mg | ORAL_CAPSULE | Freq: Every day | ORAL | Status: DC
  Administered 2012-07-29: 12.5 mg via ORAL
  Filled 2012-07-29 (×3): qty 1

## 2012-07-29 NOTE — Progress Notes (Signed)
Speech Language Pathology Daily Session Note  Patient Details  Name: [name] MRN: [id number] Date of Birth: [birth year]  Today's Date: 07/29/2012 Time: 1330-1415 Time Calculation (min): 45 min  Short Term Goals: Week 1: SLP Short Term Goal 1 (Week 1): Patient will recall steps of a transfer sequence as taught by PT/OT with supervision verbal cues SLP Short Term Goal 2 (Week 1): Patient will recall new medication name and functions with supervision vebral cues to utilize external aids SLP Short Term Goal 3 (Week 1): Patient will solve complex problems with supervision level verbal cues  Skilled Therapeutic Interventions: Treatment session focused on addressing cognition during daily functional kitchen tasks. SLP facilitated session with max faded to mod assist verbal and visual cues to recall and perform sit to stand sequence with use of walker. SLP also facilitated session with mod assist verbal cues to recall and safely utilize walker during session where she was locating, reaching for, organizing and preparing cooking task.  SLP educated patient regarding level of cuing and impact on safety at home.  SLP informed her of implications of not recalling and carrying over safety procedures resulting in recommendation of 24/7 supervision upon discharge.      FIM:  Comprehension Comprehension Mode: Auditory Comprehension: 5-Understands complex 90% of the time/Cues < 10% of the time Expression Expression Mode: Verbal Expression: 5-Expresses complex 90% of the time/cues < 10% of the time Social Interaction Social Interaction: 6-Interacts appropriately with others with medication or extra time (anti-anxiety, antidepressant). Problem Solving Problem Solving: 3-Solves basic 50 - 74% of the time/requires cueing 25 - 49% of the time Memory Memory: 2-Recognizes or recalls 25 - 49% of the time/requires cueing 51 - 75% of the time FIM - Eating Eating Activity: 5: Set-up assist for open  containers  Pain Pain Assessment Pain Assessment: No/denies pain  Therapy/Group: Individual Therapy  Charlane Ferretti., CCC-SLP [phone]  [name] 07/29/2012, 4:33 PM

## 2012-07-29 NOTE — Progress Notes (Signed)
Occupational Therapy Session Note  Patient Details  Name: [name] MRN: [id number] Date of Birth: 12/28/1950  Today's Date: 07/29/2012 Time: 1300-1330 Time Calculation (min): 30 min  Short Term Goals: Week 2:  OT Short Term Goal 1 (Week 2): Pt will ambulate to toilet with RW with supervision. OT Short Term Goal 2 (Week 2): Pt will don underwear and pants with supervision. OT Short Term Goal 3 (Week 2): Pt will  bathe with supervision. OT Short Term Goal 4 (Week 2): Pt will use LUE as a gross assist in bimanual tasks.  Skilled Therapeutic Interventions/Progress Updates:  Self care retraining to include self feeding techniques.  Practiced using built up handle fork with LUE to scoop and stab food items.  Patient able to grip handle however, has difficulty maintaining/sustaining grip around the handle as well as coordinating the motion of scooping then bringing utensil to mouth.  Patient also practiced bilateral UEs to cut food with RUE and required hand over hand to be successful.   Therapy Documentation Precautions:  Precautions Precautions: Fall Precaution Comments: left weakness UE>LE Restrictions Weight Bearing Restrictions: No Pain: Denies pain See FIM for current functional status  Therapy/Group: Individual Therapy  Amneet Cendejas 07/29/2012, 3:30 PM

## 2012-07-29 NOTE — Progress Notes (Signed)
Patient ID: [name], female   DOB: [birth year], 61 y.o.   MRN: [id number]  Subjective/Complaints: Frequent and urgent urination no burning, Improving on oxybutnin XL UA - Objective: Vital Signs: Blood pressure 170/85, pulse 54, temperature 98 F (36.7 C), temperature source Oral, resp. rate 20, weight 98.8 kg (217 lb 13 oz), SpO2 100.00%. No results found. Results for orders placed during the hospital encounter of 07/22/12 (from the past 72 hour(s))  GLUCOSE, CAPILLARY     Status: Abnormal   Collection Time   07/26/12  8:35 AM      Component Value Range Comment   Glucose-Capillary 207 (*) 70 - 99 mg/dL   GLUCOSE, CAPILLARY     Status: Abnormal   Collection Time   07/26/12 11:39 AM      Component Value Range Comment   Glucose-Capillary 134 (*) 70 - 99 mg/dL   GLUCOSE, CAPILLARY     Status: Normal   Collection Time   07/26/12  4:20 PM      Component Value Range Comment   Glucose-Capillary 86  70 - 99 mg/dL   GLUCOSE, CAPILLARY     Status: Abnormal   Collection Time   07/26/12  9:23 PM      Component Value Range Comment   Glucose-Capillary 108 (*) 70 - 99 mg/dL   GLUCOSE, CAPILLARY     Status: Abnormal   Collection Time   07/27/12 12:15 PM      Component Value Range Comment   Glucose-Capillary 111 (*) 70 - 99 mg/dL   GLUCOSE, CAPILLARY     Status: Abnormal   Collection Time   07/27/12  4:53 PM      Component Value Range Comment   Glucose-Capillary 143 (*) 70 - 99 mg/dL   GLUCOSE, CAPILLARY     Status: Normal   Collection Time   07/27/12  9:47 PM      Component Value Range Comment   Glucose-Capillary 74  70 - 99 mg/dL   GLUCOSE, CAPILLARY     Status: Abnormal   Collection Time   07/28/12  7:32 AM      Component Value Range Comment   Glucose-Capillary 124 (*) 70 - 99 mg/dL   GLUCOSE, CAPILLARY     Status: Abnormal   Collection Time   07/28/12 11:51 AM      Component Value Range Comment   Glucose-Capillary 118 (*) 70 - 99 mg/dL    Comment 1 Notify RN     GLUCOSE,  CAPILLARY     Status: Abnormal   Collection Time   07/28/12  4:30 PM      Component Value Range Comment   Glucose-Capillary 138 (*) 70 - 99 mg/dL    Comment 1 Notify RN     GLUCOSE, CAPILLARY     Status: Abnormal   Collection Time   07/28/12  8:35 PM      Component Value Range Comment   Glucose-Capillary 188 (*) 70 - 99 mg/dL      HEENT: normal Cardio: RRR Resp: CTA B/L GI: BS positive Extremity:  No Edema Skin:   Intact Neuro: Alert/Oriented, Cranial Nerve II-XII normal, Normal Sensory, Abnormal Motor R side 5/5, L side 1/5 distal, 2-/5 prox UE, 3/5 LLE and Abnormal FMC Ataxic/ dec FMC Musc/Skel:  Normal   Assessment/Plan: 1. 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 Physiatrist is providing close team supervision and  24 hour management of active medical problems listed below. Physiatrist and rehab team continue to assess barriers to discharge/monitor patient progress toward functional and medical goals. FIM: FIM - Bathing Bathing Steps Patient Completed: Chest;Left Arm;Abdomen;Front perineal area;Buttocks;Right upper leg;Left upper leg;Right lower leg (including foot);Left lower leg (including foot) (used long sponge) Bathing: 4: Min-Patient completes 8-9 20f 10 parts or 75+ percent  FIM - Upper Body Dressing/Undressing Upper body dressing/undressing steps patient completed: Thread/unthread right bra strap;Thread/unthread left bra strap;Thread/unthread right sleeve of pullover shirt/dresss;Thread/unthread left sleeve of pullover shirt/dress;Put head through opening of pull over shirt/dress;Pull shirt over trunk Upper body dressing/undressing: 4: Min-Patient completed 75 plus % of tasks FIM - Lower Body Dressing/Undressing Lower body dressing/undressing steps patient completed: Thread/unthread right underwear leg;Thread/unthread left underwear  leg;Thread/unthread right pants leg;Thread/unthread left pants leg Lower body dressing/undressing: 3: Mod-Patient completed 50-74% of tasks  FIM - Toileting Toileting steps completed by patient: Performs perineal hygiene Toileting: 2: Max-Patient completed 1 of 3 steps  FIM - Diplomatic Services operational officer Devices: Grab bars Toilet Transfers: 4-To toilet/BSC: Min A (steadying Pt. > 75%);4-From toilet/BSC: Min A (steadying Pt. > 75%)  FIM - Bed/Chair Transfer Bed/Chair Transfer Assistive Devices: Bed rails;Arm rests Bed/Chair Transfer: 4: Supine > Sit: Min A (steadying Pt. > 75%/lift 1 leg);4: Sit > Supine: Min A (steadying pt. > 75%/lift 1 leg);4: Bed > Chair or W/C: Min A (steadying Pt. > 75%);4: Chair or W/C > Bed: Min A (steadying Pt. > 75%)  FIM - Locomotion: Wheelchair Locomotion: Wheelchair: 1: Total Assistance/staff pushes wheelchair (Pt<25%) FIM - Locomotion: Ambulation Locomotion: Ambulation Assistive Devices: Walker - Rolling;Orthosis (R hand orthosis) Ambulation/Gait Assistance: 4: Min guard Locomotion: Ambulation: 4: Travels 150 ft or more with minimal assistance (Pt.>75%)  Comprehension Comprehension Mode: Auditory Comprehension: 7-Follows complex conversation/direction: With no assist  Expression Expression Mode: Verbal Expression: 7-Expresses complex ideas: With no assist  Social Interaction Social Interaction: 6-Interacts appropriately with others with medication or extra time (anti-anxiety, antidepressant).  Problem Solving Problem Solving: 5-Solves basic problems: With no assist  Memory Memory: 6-More than reasonable amt of time  Medical Problem List and Plan:  1. DVT Prophylaxis/Anticoagulation: Pharmaceutical: Lovenox  2. Pain Management: started on neurontin for left shoulder pain. Add ice prn. Will monitor.  3. Mood: continue celexa and effexor daily. LCSW to follow for formal evaluation.  4. Neuropsych: This patient is capable of making  decisions on his/her own behalf.  5. CAD: On plavix and lopressor. Lipitor added at this admission.  6. DM type 2: monitor with ac/hs CBG checks. Resume metformin bid as CT/MRI done without contrast.  7. HTN; monitor with bid checks. Continue cozaar and lopressor 8.  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

## 2012-07-29 NOTE — Progress Notes (Signed)
Physical Therapy Session Note  Patient Details  Name: [name] MRN: [id number] Date of Birth: Jan 09, 1951  Today's Date: 07/29/2012 Time: 0805-0900 Time Calculation (min): 55 min  Short Term Goals: Week 2:  PT Short Term Goal 1 (Week 2): pt will perform bed mobility with S PT Short Term Goal 2 (Week 2): pt will perform basic transfer consistently with supervision, with 1 or less cue for safe use of Rw PT Short Term Goal 3 (Week 2): pt will perform gait x 150 with supervision/min assist  Skilled Therapeutic Interventions/Progress Updates:   Pt in bed, refused breakfast, tearful about possiblity of d/c to SNF.  Therapist offered emotional support, and discussed importance of participating while here.  Pt agreeable to tx at that point. Discussed food choices with NT; pt is very frustrated about food, as well.  Pt attempted to sit up by lowering bil LEs off bed first, from supine.  She requires total assist using this method.    Neuromuscular re-education for abdominal activation, incorporating LUE for rolling R before attempting to sit up, R sidelying> sit with tactile cues to use RUE to push.  Bed> w/c stand pivot with close supervisiont.  Standing balance activity of oral hygiene at sink, leaning onto sink but still drifting L during this divided attention task.  Gait training x 150' with RW, L hand orthosis, mod VCs for attention to L, supervison to min assist for full wt shifting to R.  Therapeutic exercise performed with bil LEs and bil UEs  to increase strength for functional mobilit: Nu Step at level 4, x 8 minutes with short rest breaks PRN, rated 13 on Borg scale.  Pt required min assist and VCs each sit>< stand for safe use of walker and L hand splint, due to decreased memory for techniques.      Therapy Documentation Precautions:  Precautions Precautions: Fall Precaution Comments: left weakness UE>LE Restrictions Weight Bearing Restrictions: No Pain: Pain  Assessment Pain Assessment: No/denies pain     See FIM for current functional status  Therapy/Group: Individual Therapy  Said Rueb 07/29/2012, 12:30 PM

## 2012-07-29 NOTE — Progress Notes (Signed)
Occupational Therapy Weekly Progress Note  Patient Details  Name: [name] MRN: [id number] Date of Birth: 1950/12/04  Today's Date: 07/29/2012 Time: 1005-1130 Time Calculation (min): 85 min  1:1   Pt seen for BADL retraining of toileting, bathing, and dressing with a focus on standing balance and use of LUE.  Pt retrieved clothing from drawers, bending at knees and reaching out of base of support with supervision.  Pt also stood to reach for cell phone and reached out of BOS to right with supervision.  Ambulated in/out of bathroom without any safety cues.  Continues to have difficulty with pulling pants over hips, mostly due to tightness of clothing.  Used left hand to wash right arm with mod assist. After ADLs completed, pt worked on LUE motor control with "dusting" a table exercises and "washing a window" against a wall to increase shoulder control.  Grasp and release of washcloth.  Skilled Therapeutic Interventions/Progress Updates:  Patient has met 5 of 5 short term goals.  Pt is progressing well with increased LUE AROM and functional use, improved balance, and ability to complete self care with minimal assist.  Patient continues to demonstrate the following deficits: left hemiplegia, decreased balance, decreased safety awareness and therefore will continue to benefit from skilled OT intervention to enhance overall performance with BADL.  Patient progressing toward long term goals.  Continue plan of care. 5-7 days a week, 60- 90 min a day with Balance/vestibular training;Discharge planning;DME/adaptive equipment instruction;Functional mobility training;Patient/family education;Neuromuscular re-education;Self Care/advanced ADL retraining;Therapeutic Activities;UE/LE Strength taining/ROM;Therapeutic Exercise;UE/LE Coordination activities.    OT Short Term Goals Week 1:  OT Short Term Goal 1 (Week 1): Pt will ambulate to toilet with min assist with RW. OT Short Term Goal 1 - Progress  (Week 1): Met OT Short Term Goal 2 (Week 1): Pt will don shirt with set up. OT Short Term Goal 2 - Progress (Week 1): Met OT Short Term Goal 3 (Week 1): Pt will don pants with min assist. OT Short Term Goal 3 - Progress (Week 1): Met OT Short Term Goal 4 (Week 1): Pt will stand at sink to brush teeth with supervision. OT Short Term Goal 4 - Progress (Week 1): Met OT Short Term Goal 5 (Week 1): Pt will transfer to shower chair with min assist. OT Short Term Goal 5 - Progress (Week 1): Met Week 2:  OT Short Term Goal 1 (Week 2): Pt will ambulate to toilet with RW with supervision. OT Short Term Goal 2 (Week 2): Pt will don underwear and pants with supervision. OT Short Term Goal 3 (Week 2): Pt will  bathe with supervision. OT Short Term Goal 4 (Week 2): Pt will use LUE as a gross assist in bimanual tasks.   Therapy Documentation Precautions:  Precautions Precautions: Fall Precaution Comments: left weakness UE>LE Restrictions Weight Bearing Restrictions: No   Vital Signs: Therapy Vitals Pulse Rate: 64  BP: 153/85 mmHg Pain: Pain Assessment Pain Assessment: No/denies pain ADL:  See FIM for current functional status  Therapy/Group: Individual Therapy  SAGUIER,JULIA 07/29/2012, 12:04 PM

## 2012-07-30 ENCOUNTER — Inpatient Hospital Stay (HOSPITAL_COMMUNITY): Payer: BC Managed Care – PPO | Admitting: Occupational Therapy

## 2012-07-30 ENCOUNTER — Inpatient Hospital Stay (HOSPITAL_COMMUNITY): Payer: BC Managed Care – PPO | Admitting: Physical Therapy

## 2012-07-30 ENCOUNTER — Inpatient Hospital Stay (HOSPITAL_COMMUNITY): Payer: BC Managed Care – PPO

## 2012-07-30 LAB — GLUCOSE, CAPILLARY
Glucose-Capillary: 169 mg/dL — ABNORMAL HIGH (ref 70–99)
Glucose-Capillary: 87 mg/dL (ref 70–99)
Glucose-Capillary: 117 mg/dL — ABNORMAL HIGH (ref 70–99)

## 2012-07-30 MED ORDER — METOPROLOL TARTRATE 50 MG PO TABS
50.0000 mg | ORAL_TABLET | Freq: Two times a day (BID) | ORAL | Status: DC
  Administered 2012-07-30 – 2012-08-06 (×15): 50 mg via ORAL
  Filled 2012-07-30 (×17): qty 1

## 2012-07-30 MED ORDER — HYDROCHLOROTHIAZIDE 25 MG PO TABS
25.0000 mg | ORAL_TABLET | Freq: Every day | ORAL | Status: DC
  Administered 2012-07-30 – 2012-08-06 (×8): 25 mg via ORAL
  Filled 2012-07-30 (×9): qty 1

## 2012-07-30 MED ORDER — HYDROCHLOROTHIAZIDE 12.5 MG PO CAPS
25.0000 mg | ORAL_CAPSULE | Freq: Every day | ORAL | Status: DC
  Filled 2012-07-30: qty 2

## 2012-07-30 MED ORDER — METOPROLOL TARTRATE 50 MG PO TABS
50.0000 mg | ORAL_TABLET | Freq: Two times a day (BID) | ORAL | Status: DC
  Filled 2012-07-30 (×2): qty 1

## 2012-07-30 NOTE — Progress Notes (Signed)
Physical Therapy Session Note  Patient Details  Name: [name] MRN: [id number] Date of Birth: 1951-03-15  Today's Date: 07/30/2012 Time:  - 0800-0902 62 min    Short Term Goals: Week 2:  PT Short Term Goal 1 (Week 2): pt will perform bed mobility with S PT Short Term Goal 2 (Week 2): pt will perform basic transfer consistently with supervision, with 1 or less cue for safe use of Rw PT Short Term Goal 3 (Week 2): pt will perform gait x 150 with supervision/min assist  Skilled Therapeutic Interventions/Progress Updates:   neuromuscular re-education via VCs, demo for bed mobility on higher bed, in ADL apt.  Pt cannot remember more typical method of rolling to sidelying before sitting up, but was successful 2/3 attempts with the sequence of: dropping bil LEs off bed, then pushing trunk up from supine using R UE.    Bed><RW sit  with tactile cues each time (in room and ADL apartment)  to remember L hand in splint, reaching back to sit.  Gait in home situation on carpet, L hand splint, x 50' including pulling out and sitting in an armless chair at table, standing to place clothes on hangers and transport to rod 3' away.  Pt required supervision with max VCs except for mod assist x 2 due to LOB L when stepping L transitioning toward closet clothes rod; L foot did not clear floor.  Gait on level tile x 100' with RW with supervision.  Therapeutic exercise performed with LE to increase strength for functional mobility: NuStep at resistance 4, x 8 minutes, L hand orthosis.  Pt rated 13 on borg scale.  Discussed with pt her inactivity/depression in the last few years.  She stated that she had been depressed almost 10 years.  Therapist generated a neuropsych eval with input from other team members, to determine source of pt's short term memory deficits which impact her safety and discharge plan.  Therapy Documentation Precautions:  Precautions Precautions: Fall Precaution Comments: left  weakness UE>LE Restrictions Weight Bearing Restrictions: No Pain: Pain Assessment Pain Assessment: No/denies pain   Locomotion : Ambulation Ambulation/Gait Assistance: 3: Mod assist      See FIM for current functional status  Therapy/Group: Individual Therapy  Minervia Osso 07/30/2012, 8:54 AM

## 2012-07-30 NOTE — Progress Notes (Signed)
07/30/12 Nursing Note: BP 170/80 and pulse 56 . Taken  [employer]. Notified Dan Anguilli,PA.wbb

## 2012-07-30 NOTE — Progress Notes (Signed)
Patient ID: [name], female   DOB: 02-Mar-1951, 61 y.o.   MRN: [id number]  Subjective/Complaints: Frequent and urgent urination no burning, Improving on oxybutnin XL UA - PT notes safety Objective: Vital Signs: Blood pressure 170/80, pulse 56, temperature 97.8 F (36.6 C), temperature source Oral, resp. rate 19, weight 91.2 kg (201 lb 1 oz), SpO2 97.00%. No results found. Results for orders placed during the hospital encounter of 07/22/12 (from the past 72 hour(s))  GLUCOSE, CAPILLARY     Status: Abnormal   Collection Time   07/27/12 12:15 PM      Component Value Range Comment   Glucose-Capillary 111 (*) 70 - 99 mg/dL   GLUCOSE, CAPILLARY     Status: Abnormal   Collection Time   07/27/12  4:53 PM      Component Value Range Comment   Glucose-Capillary 143 (*) 70 - 99 mg/dL   GLUCOSE, CAPILLARY     Status: Normal   Collection Time   07/27/12  9:47 PM      Component Value Range Comment   Glucose-Capillary 74  70 - 99 mg/dL   GLUCOSE, CAPILLARY     Status: Abnormal   Collection Time   07/28/12  7:32 AM      Component Value Range Comment   Glucose-Capillary 124 (*) 70 - 99 mg/dL   GLUCOSE, CAPILLARY     Status: Abnormal   Collection Time   07/28/12 11:51 AM      Component Value Range Comment   Glucose-Capillary 118 (*) 70 - 99 mg/dL    Comment 1 Notify RN     GLUCOSE, CAPILLARY     Status: Abnormal   Collection Time   07/28/12  4:30 PM      Component Value Range Comment   Glucose-Capillary 138 (*) 70 - 99 mg/dL    Comment 1 Notify RN     GLUCOSE, CAPILLARY     Status: Abnormal   Collection Time   07/28/12  8:35 PM      Component Value Range Comment   Glucose-Capillary 188 (*) 70 - 99 mg/dL   GLUCOSE, CAPILLARY     Status: Abnormal   Collection Time   07/29/12  9:32 AM      Component Value Range Comment   Glucose-Capillary 133 (*) 70 - 99 mg/dL    Comment 1 Notify RN     GLUCOSE, CAPILLARY     Status: Abnormal   Collection Time   07/29/12 11:36 AM      Component  Value Range Comment   Glucose-Capillary 125 (*) 70 - 99 mg/dL    Comment 1 Notify RN     GLUCOSE, CAPILLARY     Status: Normal   Collection Time   07/29/12  4:49 PM      Component Value Range Comment   Glucose-Capillary 71  70 - 99 mg/dL   GLUCOSE, CAPILLARY     Status: Abnormal   Collection Time   07/29/12  9:27 PM      Component Value Range Comment   Glucose-Capillary 108 (*) 70 - 99 mg/dL   GLUCOSE, CAPILLARY     Status: Abnormal   Collection Time   07/30/12  7:26 AM      Component Value Range Comment   Glucose-Capillary 106 (*) 70 - 99 mg/dL    Comment 1 Documented in Chart      Comment 2 Notify RN        HEENT: normal Cardio: RRR Resp: CTA B/L GI:  BS positive Extremity:  No Edema Skin:   Intact Neuro: Alert/Oriented, Cranial Nerve II-XII normal, Normal Sensory, Abnormal Motor R side 5/5, L side 1/5 distal, 2-/5 prox UE, 3/5 LLE and Abnormal FMC Ataxic/ dec FMC Musc/Skel:  Normal   Assessment/Plan: 1. 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 Physiatrist is providing close team supervision and 24 hour management of active medical problems listed below. Physiatrist and rehab team continue to assess barriers to discharge/monitor patient progress toward functional and medical goals. FIM: FIM - Bathing Bathing Steps Patient Completed: Chest;Left Arm;Abdomen;Front perineal area;Buttocks;Right upper leg;Left upper leg;Right lower leg (including foot);Left lower leg (including foot) (used long sponge) Bathing: 4: Min-Patient completes 8-9 26f 10 parts or 75+ percent  FIM - Upper Body Dressing/Undressing Upper body dressing/undressing steps patient completed: Thread/unthread left sleeve of pullover shirt/dress;Put head through opening of pull over shirt/dress;Pull shirt over trunk;Thread/unthread right sleeve of pullover shirt/dresss Upper body  dressing/undressing: 5: Supervision: Safety issues/verbal cues FIM - Lower Body Dressing/Undressing Lower body dressing/undressing steps patient completed: Thread/unthread right underwear leg;Thread/unthread left underwear leg;Thread/unthread right pants leg;Thread/unthread left pants leg Lower body dressing/undressing: 3: Mod-Patient completed 50-74% of tasks  FIM - Toileting Toileting steps completed by patient: Performs perineal hygiene Toileting: 4: Steadying assist  FIM - Diplomatic Services operational officer Devices: Art gallery manager Transfers: 4-To toilet/BSC: Min A (steadying Pt. > 75%);4-From toilet/BSC: Min A (steadying Pt. > 75%)  FIM - Bed/Chair Transfer Bed/Chair Transfer Assistive Devices: Bed rails;Arm rests Bed/Chair Transfer: 4: Supine > Sit: Min A (steadying Pt. > 75%/lift 1 leg);4: Sit > Supine: Min A (steadying pt. > 75%/lift 1 leg);4: Bed > Chair or W/C: Min A (steadying Pt. > 75%);4: Chair or W/C > Bed: Min A (steadying Pt. > 75%)  FIM - Locomotion: Wheelchair Locomotion: Wheelchair: 1: Total Assistance/staff pushes wheelchair (Pt<25%) FIM - Locomotion: Ambulation Locomotion: Ambulation Assistive Devices: Walker - Rolling;Orthosis (R hand orthosis) Ambulation/Gait Assistance: 4: Min guard Locomotion: Ambulation: 4: Travels 150 ft or more with minimal assistance (Pt.>75%)  Comprehension Comprehension Mode: Auditory Comprehension: 5-Understands basic 90% of the time/requires cueing < 10% of the time  Expression Expression Mode: Verbal Expression: 5-Expresses basic 90% of the time/requires cueing < 10% of the time.  Social Interaction Social Interaction: 6-Interacts appropriately with others with medication or extra time (anti-anxiety, antidepressant).  Problem Solving Problem Solving: 5-Solves complex 90% of the time/cues < 10% of the time  Memory Memory: 3-Recognizes or recalls 50 - 74% of the time/requires cueing 25 - 49% of the time  Medical Problem  List and Plan:  1. DVT Prophylaxis/Anticoagulation: Pharmaceutical: Lovenox  2. Pain Management: started on neurontin for left shoulder pain. Add ice prn. Will monitor.  3. Mood: continue celexa and effexor daily. LCSW to follow for formal evaluation.  4. Neuropsych: This patient is capable of making decisions on his/her own behalf.  5. CAD: On plavix and lopressor. Lipitor added at this admission.  6. DM type 2: monitor with ac/hs CBG checks. Resume metformin bid as CT/MRI done without contrast.  7. HTN; monitor with bid checks. Continue cozaar and lopressor 8.  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

## 2012-07-30 NOTE — Progress Notes (Signed)
Occupational Therapy Session Note  Patient Details  Name: [name] MRN: [id number] Date of Birth: 1950-12-13  Today's Date: 07/30/2012 Time: 4098-1191 Time Calculation (min): 45 min  Short Term Goals: Week 2:  OT Short Term Goal 1 (Week 2): Pt will ambulate to toilet with RW with supervision. OT Short Term Goal 2 (Week 2): Pt will don underwear and pants with supervision. OT Short Term Goal 3 (Week 2): Pt will  bathe with supervision. OT Short Term Goal 4 (Week 2): Pt will use LUE as a gross assist in bimanual tasks.  Skilled Therapeutic Interventions/Progress Updates:  Self care retraining to include toilet transfers and toileting.  Practiced recliner>bed>BSC with stand step transfer with supervision to include pants down and up.  Then patient decided that she really did need to use the bathroom and requested to use the commode in the bathroom so she ambulated with RW with left hand splint with supervision into and out of the bathroom.  Patient agreed to call out when she was ready to stand yet was standing with pants almost up when I checked on her.  She said she forgot that she agreed to call me when she was ready to stand.  Practiced bed><BSC again from the opposite side of the bed (patient's request) and she is still at a supervision level.  Patient with urinary urgency therefore making attempts to make her safe to perform [facility] transfers when she is in the bed.  Continued practice is necessary.  Therapy Documentation Precautions:  Precautions Precautions: Fall Precaution Comments: left weakness UE>LE Restrictions Weight Bearing Restrictions: No Pain: Denies pain See FIM for current functional status  Therapy/Group: Individual Therapy  Armaan Pond 07/30/2012, 4:21 PM

## 2012-07-30 NOTE — Progress Notes (Signed)
Physical Therapy Session Note  Patient Details  Name: [name] MRN: [id number] Date of Birth: 01-15-1951  Today's Date: 07/30/2012 Time: 7829-5621 Time Calculation (min): 30 min  Short Term Goals: Week 1:  PT Short Term Goal 1 (Week 1): Pt will perform bed<>chair transfers with S throuhout entire tx PT Short Term Goal 1 - Progress (Week 1): Not met PT Short Term Goal 2 (Week 1): Pt will perform sit<>supine with S only PT Short Term Goal 2 - Progress (Week 1): Not met PT Short Term Goal 3 (Week 1): Pt will ambulate 88' with RW and Min A PT Short Term Goal 3 - Progress (Week 1): Met PT Short Term Goal 4 (Week 1): Pt will propell WC x150' with S PT Short Term Goal 4 - Progress (Week 1): Not met (not a focus of tx; pt will be safer without w/c) Week 2:  PT Short Term Goal 1 (Week 2): pt will perform bed mobility with S PT Short Term Goal 2 (Week 2): pt will perform basic transfer consistently with supervision, with 1 or less cue for safe use of Rw PT Short Term Goal 3 (Week 2): pt will perform gait x 150 with supervision/min assist  Skilled Therapeutic Interventions/Progress Updates:   OT reports that patient is getting up at night and ambulating to bathroom without assistance placing her at increased falls risk.  Discussed practicing safe bed <> BSC transfers with patient and eventually making her mod I with bed <> BSC at night to minimize risk for falls secondary to urgency and frequency.  Performed bed mobility with bed rail and bed <> BSC at foot of bed with stand pivot with UE support on bed rail and BSC rails with supervision and no verbal cues needed for safety during stand pivot; placed toilet paper on one rail of BSC for easy access.  Patient had greatest difficulty sequencing using bed controls to flatten bed to reposition herself at [facility] once back in bed.  Even with multiple repetitions of bed mobility training, verbal, visual cues for use of controls and safe sequence patient  still attempted to reposition with HOB and knee part elevated.  Patient not ready for mod I but will continue to practice.   Therapy Documentation Precautions:  Precautions Precautions: Fall Precaution Comments: left weakness UE>LE Restrictions Weight Bearing Restrictions: No Vital Signs: Therapy Vitals Temp: 98.6 F (37 C) Pulse Rate: 60  Resp: 18  BP: 141/86 mmHg  Pain:  No c/o pain  See FIM for current functional status  Therapy/Group: Individual Therapy  Edman Circle Faucette 07/30/2012, 4:08 PM

## 2012-07-30 NOTE — Progress Notes (Signed)
Occupational Therapy Session Note  Patient Details  Name: [name] MRN: [id number] Date of Birth: 04-Mar-1951  Today's Date: 07/30/2012 Time: 1007-1107 Time Calculation (min): 60 min  Short Term Goals: Week 1:  OT Short Term Goal 1 (Week 1): Pt will ambulate to toilet with min assist with RW. OT Short Term Goal 1 - Progress (Week 1): Met OT Short Term Goal 2 (Week 1): Pt will don shirt with set up. OT Short Term Goal 2 - Progress (Week 1): Met OT Short Term Goal 3 (Week 1): Pt will don pants with min assist. OT Short Term Goal 3 - Progress (Week 1): Met OT Short Term Goal 4 (Week 1): Pt will stand at sink to brush teeth with supervision. OT Short Term Goal 4 - Progress (Week 1): Met OT Short Term Goal 5 (Week 1): Pt will transfer to shower chair with min assist. OT Short Term Goal 5 - Progress (Week 1): Met Week 2:  OT Short Term Goal 1 (Week 2): Pt will ambulate to toilet with RW with supervision. OT Short Term Goal 2 (Week 2): Pt will don underwear and pants with supervision. OT Short Term Goal 3 (Week 2): Pt will  bathe with supervision. OT Short Term Goal 4 (Week 2): Pt will use LUE as a gross assist in bimanual tasks.  Skilled Therapeutic Interventions/Progress Updates:    Pt opted to bathe at sink today. Pt seen for ADL retraining with a focus on standing balance and use of LUE.  Pt stood with supervision at sink to complete self care.  She used LUE as an active assist with grooming and dressing tasks.  To don sock, pt sat on EOB and positioned feet onto chair.  Pt expressed that she knows to call for nursing assist to toilet but often can not wait as she has increased urgency.  Therapy Documentation Precautions:  Precautions Precautions: Fall Precaution Comments: left weakness UE>LE Restrictions Weight Bearing Restrictions: No  Pain: Pain Assessment Pain Assessment: No/denies pain ADL:  See FIM for current functional status  Therapy/Group: Individual  Therapy  [name] 07/30/2012, 11:35 AM

## 2012-07-31 ENCOUNTER — Encounter (HOSPITAL_COMMUNITY): Payer: BC Managed Care – PPO | Admitting: Occupational Therapy

## 2012-07-31 LAB — GLUCOSE, CAPILLARY
Glucose-Capillary: 149 mg/dL — ABNORMAL HIGH (ref 70–99)
Glucose-Capillary: 89 mg/dL (ref 70–99)
Glucose-Capillary: 167 mg/dL — ABNORMAL HIGH (ref 70–99)

## 2012-07-31 NOTE — Progress Notes (Signed)
Patient ID: [name], female   DOB: 15-Aug-1951, 61 y.o.   MRN: [id number]  61 year old patient admitted with a right MCA territory stroke. She has done quite well without concerns or complaints. She has CAD which has been stable. She has type 2 diabetes and hypertension. Comfortable night  Examination patient comfortable Chest clear to auscultation; cardiovascular normal heart sounds regular; abdomen soft and nontender no distention; extremities no edema; neuro left hemiparesthesias, arm greater than the leg  Impression status post right MCA distribution stroke. Diabetes stable. Hypertension. Will observe over the next 24 hours. May need titration of blood pressure medicines  CBG (last 3)   Basename 07/31/12 0733 07/30/12 2129 07/30/12 1636  GLUCAP 122* 117* 87    BP Readings from Last 3 Encounters:  07/31/12 167/88  07/22/12 146/83  02/12/12 130/82    Past Medical History  Diagnosis Date  . Heart attack   . Stroke   . Hypertension   . Diabetes mellitus   . Depression   . Sleep apnea   . Hyperlipidemia   . Bronchitis   . SOB (shortness of breath)   . Arthritis   . Dizziness   . Weakness   . Chronic kidney disease     possible stones - confirm with patient  . Sore throat   . Sinus problem   . Family history of breast cancer   . Renal cancer     History   Social History  . Marital Status: Married    Spouse Name: N/A    Number of Children: N/A  . Years of Education: N/A   Occupational History  . Not on file.   Social History Main Topics  . Smoking status: Never Smoker   . Smokeless tobacco: Not on file  . Alcohol Use: No  . Drug Use: No  . Sexually Active: Not on file   Other Topics Concern  . Not on file   Social History Narrative  . No narrative on file    Past Surgical History  Procedure Date  . Spleen removal 2001  . Laparoscopic gastric banding 05/09/2008  . Abdominal hysterectomy 2002  . Cholecystectomy 1999  . Nephrectomy 2001   due to cancer    Family History  Problem Relation Age of Onset  . Diabetes Father   . Alzheimer's disease Father   . Kidney cancer Father   . Hypertension Father   . Kidney disease Father     kidney removal  . Hypertension Mother   . Arthritis Mother   . Stroke Brother   . Diabetes Brother   . Hypertension Brother   . Diabetes Sister   . Thyroid disease Sister   . Hyperlipidemia Sister   . Other Sister     thyroidectomy    Allergies  Allergen Reactions  . Iohexol      Code: HIVES, Desc: Pt states in 2005 w/ a heart cath, she broke out in large hives and a rash.  She was given 50mg  benadryl, po.  She tolerated the procedure well w/o complication.  Thanks., Onset Date: [id number]     No current facility-administered medications on file prior to encounter.   Current Outpatient Prescriptions on File Prior to Encounter  Medication Sig Dispense Refill  . atorvastatin (LIPITOR) 20 MG tablet Take 1 tablet (20 mg total) by mouth daily at 6 PM.      . citalopram (CELEXA) 20 MG tablet Take 20 mg by mouth at bedtime.      Marland Kitchen  clopidogrel (PLAVIX) 75 MG tablet Take 75 mg by mouth at bedtime.      . gabapentin (NEURONTIN) 100 MG capsule Take 1 capsule (100 mg total) by mouth 3 (three) times daily.      Marland Kitchen losartan (COZAAR) 100 MG tablet Take 100 mg by mouth at bedtime.      . metFORMIN (GLUCOPHAGE) 500 MG tablet Take 500 mg by mouth at bedtime.      . metoprolol (LOPRESSOR) 50 MG tablet Take 50 mg by mouth 2 (two) times daily.      Marland Kitchen senna-docusate (SENOKOT-S) 8.6-50 MG per tablet Take 1 tablet by mouth at bedtime as needed.      . traMADol (ULTRAM) 50 MG tablet Take 1 tablet (50 mg total) by mouth 2 (two) times daily as needed for pain. For pain  10 tablet  0  . venlafaxine XR (EFFEXOR-XR) 150 MG 24 hr capsule Take 150 mg by mouth at bedtime.        BP 167/88  Pulse 63  Temp 97.7 F (36.5 C) (Oral)  Resp 18  Wt 91.2 kg (201 lb 1 oz)  SpO2 99%

## 2012-07-31 NOTE — Progress Notes (Signed)
Occupational Therapy Session Note  Patient Details  Name: [name] MRN: [id number] Date of Birth: 1951-01-19  Today's Date: 07/31/2012 Time: 1000-1100 Time Calculation (min): 60 min   Skilled Therapeutic Interventions/Progress Updates:    1:1 self care retraining at shower level focusing on RW safety, functional use of left hand/ UE (at active assist level), dynamic balance. Pt with difficulty with dressing (socks and threading pants and underwear) due to difficulty with bending down due to size, she suggested maybe trying a reacher in her next dressing session. Pt needed mod cues through out session to encourage her to incorporate left UE  Therapy Documentation Precautions:  Precautions Precautions: Fall Precaution Comments: left weakness UE>LE Restrictions Weight Bearing Restrictions: No Pain:  no c/o pain today  See FIM for current functional status  Therapy/Group: Individual Therapy  Roney Mans [facility] 07/31/2012, 12:24 PM

## 2012-08-01 ENCOUNTER — Inpatient Hospital Stay (HOSPITAL_COMMUNITY): Payer: BC Managed Care – PPO | Admitting: *Deleted

## 2012-08-01 LAB — GLUCOSE, CAPILLARY: Glucose-Capillary: 211 mg/dL — ABNORMAL HIGH (ref 70–99)

## 2012-08-01 NOTE — Progress Notes (Signed)
Physical Therapy Note  Patient Details  Name: [name] MRN: [id number] Date of Birth: August 21, 1951 Today's Date: 08/01/2012  1100-1155 (55 minutes) group Pain: no complaint of pain Pt participated in PT group session focused on gait training/safety/endurance. Pt ambulates 160 feet X 3 with RW + hand splint LT min to close SBA with vcs to attend to left and increase step length on left consistently.   Lazette Estala,JIM 08/01/2012, 7:33 AM

## 2012-08-01 NOTE — Progress Notes (Signed)
Patient ID: [name], female   DOB: Sep 05, 1951, 61 y.o.   MRN: [id number] Patient ID: [name], female   DOB: 1951/09/17, 61 y.o.   MRN: [id number]  Subjective/Complaints: 10/6.  Without concerns or complaints.  He is hoping for an early Friday discharge date rather than in 8 days. She feels her husband may help her acclimate over the weekend. Blood sugars remain nicely controlled Examination. Chest clear to auscultation; cardiovascular normal heart sounds no murmur normal rate; abdomen benign soft nontender no distention; extremities negative no edema; left hemiparesis  CBG (last 3)   Basename 08/01/12 0723 07/31/12 2030 07/31/12 1630  GLUCAP 103* 167* 89    BP Readings from Last 3 Encounters:  08/01/12 159/80  07/22/12 146/83  02/12/12 130/82   Objective: Vital Signs: Blood pressure 159/80, pulse 64, temperature 98.3 F (36.8 C), temperature source Oral, resp. rate 20, weight 91.2 kg (201 lb 1 oz), SpO2 98.00%. No results found. Results for orders placed during the hospital encounter of 07/22/12 (from the past 72 hour(s))  GLUCOSE, CAPILLARY     Status: Abnormal   Collection Time   07/29/12  9:32 AM      Component Value Range Comment   Glucose-Capillary 133 (*) 70 - 99 mg/dL    Comment 1 Notify RN     GLUCOSE, CAPILLARY     Status: Abnormal   Collection Time   07/29/12 11:36 AM      Component Value Range Comment   Glucose-Capillary 125 (*) 70 - 99 mg/dL    Comment 1 Notify RN     GLUCOSE, CAPILLARY     Status: Normal   Collection Time   07/29/12  4:49 PM      Component Value Range Comment   Glucose-Capillary 71  70 - 99 mg/dL   GLUCOSE, CAPILLARY     Status: Abnormal   Collection Time   07/29/12  9:27 PM      Component Value Range Comment   Glucose-Capillary 108 (*) 70 - 99 mg/dL   GLUCOSE, CAPILLARY     Status: Abnormal   Collection Time   07/30/12  7:26 AM      Component Value Range Comment   Glucose-Capillary 106 (*) 70 - 99 mg/dL    Comment 1  Documented in Chart      Comment 2 Notify RN     GLUCOSE, CAPILLARY     Status: Abnormal   Collection Time   07/30/12 11:57 AM      Component Value Range Comment   Glucose-Capillary 169 (*) 70 - 99 mg/dL    Comment 1 Documented in Chart      Comment 2 Notify RN     GLUCOSE, CAPILLARY     Status: Normal   Collection Time   07/30/12  4:36 PM      Component Value Range Comment   Glucose-Capillary 87  70 - 99 mg/dL    Comment 1 Documented in Chart      Comment 2 Notify RN     GLUCOSE, CAPILLARY     Status: Abnormal   Collection Time   07/30/12  9:29 PM      Component Value Range Comment   Glucose-Capillary 117 (*) 70 - 99 mg/dL    Comment 1 Notify RN     GLUCOSE, CAPILLARY     Status: Abnormal   Collection Time   07/31/12  7:33 AM      Component Value Range Comment   Glucose-Capillary 122 (*)  70 - 99 mg/dL    Comment 1 Notify RN     GLUCOSE, CAPILLARY     Status: Abnormal   Collection Time   07/31/12 11:27 AM      Component Value Range Comment   Glucose-Capillary 149 (*) 70 - 99 mg/dL    Comment 1 Notify RN     GLUCOSE, CAPILLARY     Status: Normal   Collection Time   07/31/12  4:30 PM      Component Value Range Comment   Glucose-Capillary 89  70 - 99 mg/dL    Comment 1 Notify RN     GLUCOSE, CAPILLARY     Status: Abnormal   Collection Time   07/31/12  8:30 PM      Component Value Range Comment   Glucose-Capillary 167 (*) 70 - 99 mg/dL    Comment 1 Notify RN     GLUCOSE, CAPILLARY     Status: Abnormal   Collection Time   08/01/12  7:23 AM      Component Value Range Comment   Glucose-Capillary 103 (*) 70 - 99 mg/dL    Comment 1 Notify RN        HEENT: normal Cardio: RRR Resp: CTA B/L GI: BS positive Extremity:  No Edema Skin:   Intact Neuro: Alert/Oriented, Cranial Nerve II-XII normal, Normal Sensory, Abnormal Motor R side 5/5, L side 1/5 distal, 2-/5 prox UE, 3/5 LLE and Abnormal FMC Ataxic/ dec FMC Musc/Skel:  Normal   Assessment/Plan: 1. Functional deficits  secondary to R MCA infarct with LUE>> LLE weakness which require 3+ hours per day of interdisciplinary therapy in a comprehensive inpatient rehab setting. Physiatrist is providing close team supervision and 24 hour management of active medical problems listed below. Physiatrist and rehab team continue to assess barriers to discharge/monitor patient progress toward functional and medical goals. FIM: FIM - Bathing Bathing Steps Patient Completed: Chest;Left Arm;Abdomen;Front perineal area;Buttocks;Right upper leg;Left upper leg;Right lower leg (including foot);Left lower leg (including foot) (used long sponge) Bathing: 4: Min-Patient completes 8-9 38f 10 parts or 75+ percent  FIM - Upper Body Dressing/Undressing Upper body dressing/undressing steps patient completed: Thread/unthread right bra strap;Thread/unthread left bra strap;Thread/unthread right sleeve of pullover shirt/dresss;Thread/unthread left sleeve of pullover shirt/dress;Put head through opening of pull over shirt/dress;Pull shirt over trunk Upper body dressing/undressing: 4: Min-Patient completed 75 plus % of tasks FIM - Lower Body Dressing/Undressing Lower body dressing/undressing steps patient completed: Thread/unthread right underwear leg;Thread/unthread left underwear leg;Pull underwear up/down;Thread/unthread right pants leg;Thread/unthread left pants leg;Pull pants up/down;Don/Doff left sock;Don/Doff right sock;Don/Doff right shoe Lower body dressing/undressing: 4: Min-Patient completed 75 plus % of tasks  FIM - Toileting Toileting steps completed by patient: Adjust clothing prior to toileting;Performs perineal hygiene;Adjust clothing after toileting Toileting Assistive Devices: Grab bar or rail for support Toileting: 5: Supervision: Safety issues/verbal cues  FIM - Diplomatic Services operational officer Devices: Elevated toilet seat;Grab bars;Walker Toilet Transfers: 5-To toilet/BSC: Supervision (verbal cues/safety  issues);5-From toilet/BSC: Supervision (verbal cues/safety issues)  FIM - Bed/Chair Transfer Bed/Chair Transfer Assistive Devices: Bed rails;Arm rests Bed/Chair Transfer: 4: Bed > Chair or W/C: Min A (steadying Pt. > 75%);5: Chair or W/C > Bed: Supervision (verbal cues/safety issues)  FIM - Locomotion: Wheelchair Locomotion: Wheelchair: 1: Total Assistance/staff pushes wheelchair (Pt<25%) FIM - Locomotion: Ambulation Locomotion: Ambulation Assistive Devices: Walker - Rolling;Orthosis Ambulation/Gait Assistance: 3: Mod assist Locomotion: Ambulation: 2: Travels 50 - 149 ft with moderate assistance (Pt: 50 - 74%) (LOB L during transitional steps)  Comprehension Comprehension Mode:  Auditory Comprehension: 5-Understands basic 90% of the time/requires cueing < 10% of the time  Expression Expression Mode: Verbal Expression: 6-Expresses complex ideas: With extra time/assistive device  Social Interaction Social Interaction: 6-Interacts appropriately with others with medication or extra time (anti-anxiety, antidepressant).  Problem Solving Problem Solving: 5-Solves complex 90% of the time/cues < 10% of the time  Memory Memory: 6-More than reasonable amt of time  Medical Problem List and Plan:  1. DVT Prophylaxis/Anticoagulation: Pharmaceutical: Lovenox  2. Pain Management: started on neurontin for left shoulder pain. Add ice prn. Will monitor.  3. Mood: continue celexa and effexor daily. LCSW to follow for formal evaluation.  4. Neuropsych: This patient is capable of making decisions on his/her own behalf.  5. CAD: On plavix and lopressor. Lipitor added at this admission.  6. DM type 2: monitor with ac/hs CBG checks. Resume metformin bid as CT/MRI done without contrast.  7. HTN; monitor with bid checks. Continue cozaar and lopressor 8.  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

## 2012-08-02 ENCOUNTER — Inpatient Hospital Stay (HOSPITAL_COMMUNITY): Payer: BC Managed Care – PPO | Admitting: Occupational Therapy

## 2012-08-02 ENCOUNTER — Inpatient Hospital Stay (HOSPITAL_COMMUNITY): Payer: BC Managed Care – PPO | Admitting: Speech Pathology

## 2012-08-02 ENCOUNTER — Inpatient Hospital Stay (HOSPITAL_COMMUNITY): Payer: BC Managed Care – PPO | Admitting: Physical Therapy

## 2012-08-02 ENCOUNTER — Ambulatory Visit (HOSPITAL_COMMUNITY): Payer: BC Managed Care – PPO

## 2012-08-02 DIAGNOSIS — I6789 Other cerebrovascular disease: Secondary | ICD-10-CM

## 2012-08-02 LAB — GLUCOSE, CAPILLARY
Glucose-Capillary: 132 mg/dL — ABNORMAL HIGH (ref 70–99)
Glucose-Capillary: 132 mg/dL — ABNORMAL HIGH (ref 70–99)
Glucose-Capillary: 144 mg/dL — ABNORMAL HIGH (ref 70–99)
Glucose-Capillary: 168 mg/dL — ABNORMAL HIGH (ref 70–99)

## 2012-08-02 MED ORDER — VENLAFAXINE HCL ER 75 MG PO CP24
75.0000 mg | ORAL_CAPSULE | Freq: Every day | ORAL | Status: DC
  Administered 2012-08-02 – 2012-08-05 (×4): 75 mg via ORAL
  Filled 2012-08-02 (×5): qty 1

## 2012-08-02 NOTE — Progress Notes (Signed)
Speech Language Pathology Daily Session Note  Patient Details  Name: [name] MRN: [id number] Date of Birth: 1951/07/04  Today's Date: 08/02/2012 Time: 1530-1610 Time Calculation (min): 40 min  Short Term Goals: Week 1: SLP Short Term Goal 1 (Week 1): Patient will recall steps of a transfer sequence as taught by PT/OT with supervision verbal cues SLP Short Term Goal 2 (Week 1): Patient will recall new medication name and functions with supervision vebral cues to utilize external aids SLP Short Term Goal 3 (Week 1): Patient will solve complex problems with supervision level verbal cues  Skilled Therapeutic Interventions: Treatment session focused on addressing recall of daily information during functional activities.  Patient required increased wait time and supervision level verbal cues to attend to telephone call to arrange times for family education with husband; social worker assist with note to remind after 30 minute delay patient was unable to recall times and required min cues to locate and utilize to recall information.  SLP also facilitated session with generation of written aid to recall sit to stand procedures, which patient required supervision level verbal cues to use in the moment.  SLP hopes that with carryover this strategy may facilitate increased recall and carryover without need for max cues to establish safe hand placement.     Pain Pain Assessment Pain Assessment: No/denies pain  Therapy/Group: Individual Therapy  Charlane Ferretti., CCC-SLP [phone]  [name] 08/02/2012, 5:17 PM

## 2012-08-02 NOTE — Progress Notes (Signed)
Occupational Therapy Session Note  Patient Details  Name: [name] MRN: [id number] Date of Birth: November 14, 1950  Today's Date: 08/02/2012 Time: 0805-0900 and 1110-1140 Time Calculation (min): 55 min and 30 min  Short Term Goals: Week 1:  OT Short Term Goal 1 (Week 1): Pt will ambulate to toilet with min assist with RW. OT Short Term Goal 1 - Progress (Week 1): Met OT Short Term Goal 2 (Week 1): Pt will don shirt with set up. OT Short Term Goal 2 - Progress (Week 1): Met OT Short Term Goal 3 (Week 1): Pt will don pants with min assist. OT Short Term Goal 3 - Progress (Week 1): Met OT Short Term Goal 4 (Week 1): Pt will stand at sink to brush teeth with supervision. OT Short Term Goal 4 - Progress (Week 1): Met OT Short Term Goal 5 (Week 1): Pt will transfer to shower chair with min assist. OT Short Term Goal 5 - Progress (Week 1): Met Week 2:  OT Short Term Goal 1 (Week 2): Pt will ambulate to toilet with RW with supervision. OT Short Term Goal 2 (Week 2): Pt will don underwear and pants with supervision. OT Short Term Goal 3 (Week 2): Pt will  bathe with supervision. OT Short Term Goal 4 (Week 2): Pt will use LUE as a gross assist in bimanual tasks.  Skilled Therapeutic Interventions/Progress Updates:  Visit 1:    Pt seen for BADL retraining of toileting, bathing, and dressing with a focus on attention, problem solving, balance, and functional mobility within the room.  Pt only required supervision with mobility and most of her self care.  She donned tight jeggings and needed help getting pants fully over left hip.  She continues to need assist with socks and shoes.  Good use of LUE without cues.    Visit 2:  Pt stood at sink to complete grooming and then ambulated to laundry room to put clothing into washer. She only required one cue to adjust washer setting.  Pt then ambulated back to room.  Overall pt stood, walked without a rest break for 20 minutes.  Placed elastic shoe laces in  shoes.       Therapy Documentation Precautions:  Precautions Precautions: Fall Precaution Comments: left weakness UE>LE Restrictions Weight Bearing Restrictions: No   Pain: Pain Assessment Pain Assessment: No/denies pain ADL:  See FIM for current functional status  Therapy/Group: Individual Therapy  Jersey Espinoza 08/02/2012, 11:55 AM

## 2012-08-02 NOTE — Progress Notes (Signed)
Social Work Patient ID: [name], female   DOB: Jun 18, 1951, 61 y.o.   MRN: [id number] Met with to and spoke with husband to discuss discharge plans.  Scheduled family education for tomorrow at 9:15-11:00 for husband to attend therapies and  Discuss with team concerns and questions.  Informed both he and pt team does not recommend being alone at home while he works.  Pt may go to Mom's home And see how she does.  Pt reports having memory issues prior to admission from first CVA.  Will discuss tomorrow while husband is here.

## 2012-08-02 NOTE — Progress Notes (Signed)
Physical Therapy Note  Patient Details  Name: [name] MRN: [id number] Date of Birth: 15-Oct-1951 Today's Date: 08/02/2012  4132-4401 (55 minutes) individual Pain: no complaint of pain  With c/o dizziness BP 159/90 (nurse notified) Focus of treatment: gait training focusing on LT inattention; LT LE neuro re-ed; bed mobility training Treatment: Gait using RW close SBA + hand splint attempting to locate horseshoes on left and right with min vcs; neuro re-ed LT LE heel slides, hip abduction , marching in place using RW support; sit to supine  (mat) SBA ; supine to side to sit with vcs for technique.   Adrian Dinovo,JIM 08/02/2012, 7:31 AM

## 2012-08-02 NOTE — Progress Notes (Signed)
Patient ID: [name], female   DOB: [birth year], 61 y.o.   MRN: [id number]  Subjective/Complaints: Frequent and urgent urination no burning, Improving on oxybutnin XL UA - Still has am spikes in BP, on effexor chronically although dose was increased  Objective: Vital Signs: Blood pressure 145/84, pulse 57, temperature 98.2 F (36.8 C), temperature source Oral, resp. rate 18, weight 91.2 kg (201 lb 1 oz), SpO2 95.00%. No results found. Results for orders placed during the hospital encounter of 07/22/12 (from the past 72 hour(s))  GLUCOSE, CAPILLARY     Status: Abnormal   Collection Time   07/30/12  7:26 AM      Component Value Range Comment   Glucose-Capillary 106 (*) 70 - 99 mg/dL    Comment 1 Documented in Chart      Comment 2 Notify RN     GLUCOSE, CAPILLARY     Status: Abnormal   Collection Time   07/30/12 11:57 AM      Component Value Range Comment   Glucose-Capillary 169 (*) 70 - 99 mg/dL    Comment 1 Documented in Chart      Comment 2 Notify RN     GLUCOSE, CAPILLARY     Status: Normal   Collection Time   07/30/12  4:36 PM      Component Value Range Comment   Glucose-Capillary 87  70 - 99 mg/dL    Comment 1 Documented in Chart      Comment 2 Notify RN     GLUCOSE, CAPILLARY     Status: Abnormal   Collection Time   07/30/12  9:29 PM      Component Value Range Comment   Glucose-Capillary 117 (*) 70 - 99 mg/dL    Comment 1 Notify RN     GLUCOSE, CAPILLARY     Status: Abnormal   Collection Time   07/31/12  7:33 AM      Component Value Range Comment   Glucose-Capillary 122 (*) 70 - 99 mg/dL    Comment 1 Notify RN     GLUCOSE, CAPILLARY     Status: Abnormal   Collection Time   07/31/12 11:27 AM      Component Value Range Comment   Glucose-Capillary 149 (*) 70 - 99 mg/dL    Comment 1 Notify RN     GLUCOSE, CAPILLARY     Status: Normal   Collection Time   07/31/12  4:30 PM      Component Value Range Comment   Glucose-Capillary 89  70 - 99 mg/dL    Comment 1 Notify  RN     GLUCOSE, CAPILLARY     Status: Abnormal   Collection Time   07/31/12  8:30 PM      Component Value Range Comment   Glucose-Capillary 167 (*) 70 - 99 mg/dL    Comment 1 Notify RN     GLUCOSE, CAPILLARY     Status: Abnormal   Collection Time   08/01/12  7:23 AM      Component Value Range Comment   Glucose-Capillary 103 (*) 70 - 99 mg/dL    Comment 1 Notify RN     GLUCOSE, CAPILLARY     Status: Abnormal   Collection Time   08/01/12 11:18 AM      Component Value Range Comment   Glucose-Capillary 181 (*) 70 - 99 mg/dL    Comment 1 Notify RN     GLUCOSE, CAPILLARY     Status: Normal   Collection  Time   08/01/12  4:43 PM      Component Value Range Comment   Glucose-Capillary 78  70 - 99 mg/dL    Comment 1 Notify RN     GLUCOSE, CAPILLARY     Status: Abnormal   Collection Time   08/01/12  8:42 PM      Component Value Range Comment   Glucose-Capillary 211 (*) 70 - 99 mg/dL    Comment 1 Notify RN        HEENT: normal Cardio: RRR Resp: CTA B/L GI: BS positive Extremity:  No Edema Skin:   Intact Neuro: Alert/Oriented, Cranial Nerve II-XII normal, Normal Sensory, Abnormal Motor R side 5/5, L side 1/5 distal, 2-/5 prox UE, 3/5 LLE and Abnormal FMC Ataxic/ dec FMC Musc/Skel:  Normal   Assessment/Plan: 1. 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 Physiatrist is providing close team supervision and 24 hour management of active medical problems listed below. Physiatrist and rehab team continue to assess barriers to discharge/monitor patient progress toward functional and medical goals. FIM: FIM - Bathing Bathing Steps Patient Completed: Chest;Left Arm;Abdomen;Front perineal area;Right upper leg;Left upper leg;Left lower leg (including foot);Right lower leg (including foot) Bathing: 4: Min-Patient completes 8-9 62f 10 parts or 75+ percent  FIM - Upper  Body Dressing/Undressing Upper body dressing/undressing steps patient completed: Thread/unthread right bra strap;Thread/unthread right sleeve of pullover shirt/dresss;Thread/unthread left sleeve of pullover shirt/dress;Put head through opening of pull over shirt/dress;Pull shirt over trunk Upper body dressing/undressing: 4: Min-Patient completed 75 plus % of tasks FIM - Lower Body Dressing/Undressing Lower body dressing/undressing steps patient completed: Thread/unthread right underwear leg;Thread/unthread left underwear leg;Pull underwear up/down;Thread/unthread right pants leg;Thread/unthread left pants leg;Pull pants up/down Lower body dressing/undressing: 4: Steadying Assist  FIM - Toileting Toileting steps completed by patient: Adjust clothing prior to toileting;Performs perineal hygiene;Adjust clothing after toileting Toileting Assistive Devices: Grab bar or rail for support Toileting: 5: Supervision: Safety issues/verbal cues  FIM - Diplomatic Services operational officer Devices: Elevated toilet seat;Grab bars;Walker Toilet Transfers: 5-From toilet/BSC: Supervision (verbal cues/safety issues);5-To toilet/BSC: Supervision (verbal cues/safety issues)  FIM - Bed/Chair Transfer Bed/Chair Transfer Assistive Devices: Bed rails;Arm rests Bed/Chair Transfer: 4: Bed > Chair or W/C: Min A (steadying Pt. > 75%);4: Chair or W/C > Bed: Min A (steadying Pt. > 75%)  FIM - Locomotion: Wheelchair Locomotion: Wheelchair: 1: Total Assistance/staff pushes wheelchair (Pt<25%) FIM - Locomotion: Ambulation Locomotion: Ambulation Assistive Devices: Walker - Rolling;Orthosis Ambulation/Gait Assistance: 3: Mod assist Locomotion: Ambulation: 2: Travels 50 - 149 ft with moderate assistance (Pt: 50 - 74%) (LOB L during transitional steps)  Comprehension Comprehension Mode: Auditory Comprehension: 5-Understands basic 90% of the time/requires cueing < 10% of the time  Expression Expression Mode:  Verbal Expression: 5-Expresses basic needs/ideas: With extra time/assistive device  Social Interaction Social Interaction: 6-Interacts appropriately with others with medication or extra time (anti-anxiety, antidepressant).  Problem Solving Problem Solving: 5-Solves complex 90% of the time/cues < 10% of the time  Memory Memory: 6-More than reasonable amt of time  Medical Problem List and Plan:  1. DVT Prophylaxis/Anticoagulation: Pharmaceutical: Lovenox  2. Pain Management: started on neurontin for left shoulder pain. Add ice prn. Will monitor.  3. Mood: continue celexa and effexor daily. LCSW to follow for formal evaluation.  4. Neuropsych: This patient is capable of making decisions on his/her own behalf.  5. CAD: On plavix and lopressor. Lipitor added at  this admission.  6. DM type 2: monitor with ac/hs CBG checks. Resume metformin bid as CT/MRI done without contrast.  7. HTN; monitor with bid checks. Continue cozaar and lopressor, reduce effexor and monitor 8.  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

## 2012-08-02 NOTE — Progress Notes (Deleted)
Social Work Patient ID: [name], female   DOB: [birth year], 61 y.o.   MRN: [id number] Spoke with [name]-RN who reports pt's PICC line needs to be discharged prior to transport.  Have sent amb back and [name] will contact once PICC removed and Pt is ready for transfer.  Contacted facility who reports they can take him any time no limit.  Pt ready once PICC removed.

## 2012-08-03 ENCOUNTER — Inpatient Hospital Stay (HOSPITAL_COMMUNITY): Payer: BC Managed Care – PPO | Admitting: Occupational Therapy

## 2012-08-03 ENCOUNTER — Inpatient Hospital Stay (HOSPITAL_COMMUNITY): Payer: BC Managed Care – PPO | Admitting: *Deleted

## 2012-08-03 LAB — GLUCOSE, CAPILLARY: Glucose-Capillary: 114 mg/dL — ABNORMAL HIGH (ref 70–99)

## 2012-08-03 NOTE — Progress Notes (Signed)
Physical Therapy Session Note  Patient Details  Name: [name] MRN: [id number] Date of Birth: Aug 07, 1951  Today's Date: 08/03/2012  1st tx Time: 9:15-10:15 Time Calculation (min): 60 min  2nd tx  Time: 14:00-14:30 Time Calculation (min): 30 min   Skilled Therapeutic Interventions/Progress Updates:  Husband present today, so tx focused on family ed as well as transfer training, gait training, car transfer, stairs, and LE coordination activities. At one point in tx while performing stairs, pt stated "I'm trying to milk this goat as long as I can, and I feel like you're trying to push me out of here." Pt advised that we do push progress in therapy, but that we will not make her do things we think she is unable to.   Performed repeated supine>EOB<>BSC to continue working on Mod I for night time toileting. Pt performed with good technique, but continues needing S with cues for safety, hand placement, and positioning. Gait training 2x130, 1x160' with RW no L hand grip and min-guard/close SBA in controlled environment  Pt easily distractable in busy environment, needing repeated cues for increasing L grip and positioning in RW, as well as continuing to goal. Pt repsonds well to cues for increased L foot clearance and step length, but unable to maintain.      Car transfer with S and cues for technique. Stairs with L rail only x10, sideways ascending to use R UE as well, and forward descending. Pt needing cues to get feet fully on steps, min-guard A needed.   Nustep x26min level 5 with L grip and bil LE, no R UE, with goal of 60spm to increase LE coordination and timing.  Advised spouse and pt to not rely on scooter or chair lift for ease of mobility as to maintain pt's strength and health. Also educated them on risk factors for CVA and importance of maintaining active lifestyle and healthy diet.   2nd tx:  Tx focused on gait training and dynamic balance on carpet and controlled  environment. Pt ambulated 150'x5 with min-guard A with RW x1, no device x2 and SPC x2 with cues for L foot clearance, which degrades with fatigue. SPC seemed most appropriate for pt safety as she frequently looses L grip on RW, and does not tend to rely on it for support. Pt with difficulty changing gait speed when cued to do so. Pt challenged to ambulate in tight spaces and around turns with SPC. Pt wanting to return to bed at end of tx. Educated pt on importance of OOB activity during day as much as possible. Rest breaks needed throughout.    Therapy Documentation Precautions:  Precautions Precautions: Fall Precaution Comments: left weakness UE>LE Restrictions Weight Bearing Restrictions: No Pain: Pain Assessment Pain Assessment: No/denies pain  See FIM for current functional status  Therapy/Group: Individual Therapy  Virl Cagey, PT 08/03/2012, 10:10 AM

## 2012-08-03 NOTE — Progress Notes (Signed)
Patient ID: [name], female   DOB: 10/31/1950, 61 y.o.   MRN: [id number]  Subjective/Complaints: No problems overnite, still with urge incont. Review of Systems  HENT:       Dry mouth  Genitourinary: Positive for urgency and frequency. Negative for dysuria.  Neurological: Positive for focal weakness and weakness.    Objective: Vital Signs: Blood pressure 163/76, pulse 57, temperature 98.1 F (36.7 C), temperature source Oral, resp. rate 19, weight 91.2 kg (201 lb 1 oz), SpO2 97.00%. No results found. Results for orders placed during the hospital encounter of 07/22/12 (from the past 72 hour(s))  GLUCOSE, CAPILLARY     Status: Abnormal   Collection Time   07/31/12 11:27 AM      Component Value Range Comment   Glucose-Capillary 149 (*) 70 - 99 mg/dL    Comment 1 Notify RN     GLUCOSE, CAPILLARY     Status: Normal   Collection Time   07/31/12  4:30 PM      Component Value Range Comment   Glucose-Capillary 89  70 - 99 mg/dL    Comment 1 Notify RN     GLUCOSE, CAPILLARY     Status: Abnormal   Collection Time   07/31/12  8:30 PM      Component Value Range Comment   Glucose-Capillary 167 (*) 70 - 99 mg/dL    Comment 1 Notify RN     GLUCOSE, CAPILLARY     Status: Abnormal   Collection Time   08/01/12  7:23 AM      Component Value Range Comment   Glucose-Capillary 103 (*) 70 - 99 mg/dL    Comment 1 Notify RN     GLUCOSE, CAPILLARY     Status: Abnormal   Collection Time   08/01/12 11:18 AM      Component Value Range Comment   Glucose-Capillary 181 (*) 70 - 99 mg/dL    Comment 1 Notify RN     GLUCOSE, CAPILLARY     Status: Normal   Collection Time   08/01/12  4:43 PM      Component Value Range Comment   Glucose-Capillary 78  70 - 99 mg/dL    Comment 1 Notify RN     GLUCOSE, CAPILLARY     Status: Abnormal   Collection Time   08/01/12  8:42 PM      Component Value Range Comment   Glucose-Capillary 211 (*) 70 - 99 mg/dL    Comment 1 Notify RN     GLUCOSE, CAPILLARY      Status: Abnormal   Collection Time   08/02/12  7:23 AM      Component Value Range Comment   Glucose-Capillary 132 (*) 70 - 99 mg/dL    Comment 1 Notify RN     GLUCOSE, CAPILLARY     Status: Abnormal   Collection Time   08/02/12  8:56 AM      Component Value Range Comment   Glucose-Capillary 132 (*) 70 - 99 mg/dL   GLUCOSE, CAPILLARY     Status: Abnormal   Collection Time   08/02/12 11:40 AM      Component Value Range Comment   Glucose-Capillary 144 (*) 70 - 99 mg/dL    Comment 1 Notify RN     GLUCOSE, CAPILLARY     Status: Abnormal   Collection Time   08/02/12  4:23 PM      Component Value Range Comment   Glucose-Capillary 168 (*) 70 - 99  mg/dL    Comment 1 Notify RN     GLUCOSE, CAPILLARY     Status: Abnormal   Collection Time   08/02/12  8:57 PM      Component Value Range Comment   Glucose-Capillary 101 (*) 70 - 99 mg/dL    Comment 1 Notify RN        HEENT: normal Cardio: RRR Resp: CTA B/L GI: BS positive Extremity:  No Edema Skin:   Intact Neuro: Alert/Oriented, Cranial Nerve II-XII normal, Normal Sensory, Abnormal Motor R side 5/5, L side 1/5 distal, 2-/5 prox UE, 3/5 LLE and Abnormal FMC Ataxic/ dec FMC Musc/Skel:  Normal visula field intact to confrontation testing, no extinction  Assessment/Plan: 1. 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 Physiatrist is providing close team supervision and 24 hour management of active medical problems listed below. Physiatrist and rehab team continue to assess barriers to discharge/monitor patient progress toward functional and medical goals. FIM: FIM - Bathing Bathing Steps Patient Completed: Chest;Left Arm;Abdomen;Front perineal area;Right upper leg;Left upper leg;Left lower leg (including foot);Right lower leg (including foot) Bathing: 4: Steadying assist  FIM - Upper Body  Dressing/Undressing Upper body dressing/undressing steps patient completed: Thread/unthread right bra strap;Thread/unthread right sleeve of pullover shirt/dresss;Thread/unthread left sleeve of pullover shirt/dress;Put head through opening of pull over shirt/dress;Pull shirt over trunk Upper body dressing/undressing: 4: Min-Patient completed 75 plus % of tasks FIM - Lower Body Dressing/Undressing Lower body dressing/undressing steps patient completed: Thread/unthread right underwear leg;Thread/unthread left underwear leg;Pull underwear up/down;Thread/unthread right pants leg;Thread/unthread left pants leg;Pull pants up/down Lower body dressing/undressing: 3: Mod-Patient completed 50-74% of tasks  FIM - Toileting Toileting steps completed by patient: Adjust clothing prior to toileting;Performs perineal hygiene;Adjust clothing after toileting Toileting Assistive Devices: Grab bar or rail for support Toileting: 5: Supervision: Safety issues/verbal cues  FIM - Diplomatic Services operational officer Devices: Art gallery manager Transfers: 5-To toilet/BSC: Supervision (verbal cues/safety issues);5-From toilet/BSC: Supervision (verbal cues/safety issues)  FIM - Bed/Chair Transfer Bed/Chair Transfer Assistive Devices: Bed rails;Arm rests Bed/Chair Transfer: 4: Bed > Chair or W/C: Min A (steadying Pt. > 75%);4: Chair or W/C > Bed: Min A (steadying Pt. > 75%)  FIM - Locomotion: Wheelchair Locomotion: Wheelchair: 1: Total Assistance/staff pushes wheelchair (Pt<25%) FIM - Locomotion: Ambulation Locomotion: Ambulation Assistive Devices: Walker - Rolling;Orthosis Ambulation/Gait Assistance: 3: Mod assist Locomotion: Ambulation: 2: Travels 50 - 149 ft with moderate assistance (Pt: 50 - 74%) (LOB L during transitional steps)  Comprehension Comprehension Mode: Auditory Comprehension: 5-Understands complex 90% of the time/Cues < 10% of the time  Expression Expression Mode: Verbal Expression: 5-Expresses  basic 90% of the time/requires cueing < 10% of the time.  Social Interaction Social Interaction: 5-Interacts appropriately 90% of the time - Needs monitoring or encouragement for participation or interaction.  Problem Solving Problem Solving: 5-Solves basic 90% of the time/requires cueing < 10% of the time  Memory Memory: 5-Recognizes or recalls 90% of the time/requires cueing < 10% of the time  Medical Problem List and Plan:  1. DVT Prophylaxis/Anticoagulation: Pharmaceutical: Lovenox  2. Pain Management: started on neurontin for left shoulder pain. Add ice prn. Will monitor.  3. Mood: continue celexa and effexor daily. LCSW to follow for formal evaluation.  4. Neuropsych: This patient is capable of making decisions on his/her own behalf.  5. CAD: On plavix and lopressor. Lipitor added at this admission.  6. DM type 2: monitor  with ac/hs CBG checks. Resume metformin bid 7. HTN; monitor with bid checks. Continue cozaar and lopressor, reduce effexor and monitor 8.  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

## 2012-08-03 NOTE — Progress Notes (Signed)
Speech Language Pathology Weekly Progress Note  Patient Details  Name: [name] MRN: [id number] Date of Birth: [birth year]  Today's Date: 08/03/2012  Short Term Goals: Week 1: SLP Short Term Goal 1 (Week 1): Patient will recall steps of a transfer sequence as taught by PT/OT with supervision verbal cues SLP Short Term Goal 1 - Progress (Week 1): Progressing toward goal SLP Short Term Goal 2 (Week 1): Patient will recall new medication name and functions with supervision vebral cues to utilize external aids SLP Short Term Goal 2 - Progress (Week 1): Progressing toward goal SLP Short Term Goal 3 (Week 1): Patient will solve complex problems with supervision level verbal cues SLP Short Term Goal 3 - Progress (Week 1): Progressing toward goal Week 2: SLP Short Term Goal 1 (Week 2): Patient will solve complex problems with supervision level verbal cues  SLP Short Term Goal 2 (Week 2): Patient will recall steps of a transfer sequence as taught by PT/OT with supervision verbal cues  SLP Short Term Goal 3 (Week 2): Patient will recall new medication name and functions with supervision vebral cues to utilize external aids   Weekly Progress Updates: Patient did not meet her 3 short term objectives this reportiong period due to decreased recall and carryover of new, taught information.  Patient's emergent awarnress appers to impact her safety in the moment in addition to her recall deficits.  SLP wonders what true baseline function is due to patient reports of doing unsafe things at home PTA, such as forgetting the stove was on etc. SLP recommends continued therapy with focus on carryover of new taught information, family education and discharge planning to ensure saferty upon discharge.   SLP Frequency: 1-2 X/day, 30-60 minutes;3 out of 7 days SLP Treatment/Interventions: Cognitive remediation/compensation;Cueing hierarchy;Environmental controls;Functional tasks;Internal/external aids;Medication  managment;Patient/family education;Therapeutic Activities  Charlane Ferretti., CCC-SLP [phone]  Zerek Litsey 08/03/2012, 8:46 AM

## 2012-08-03 NOTE — Progress Notes (Signed)
Occupational Therapy Session Note  Patient Details  Name: [name] MRN: [id number] Date of Birth: [birth year]  Today's Date: 08/03/2012 Time: 0805-0905 Time Calculation (min): 60 min  Short Term Goals: Week 1:  OT Short Term Goal 1 (Week 1): Pt will ambulate to toilet with min assist with RW. OT Short Term Goal 1 - Progress (Week 1): Met OT Short Term Goal 2 (Week 1): Pt will don shirt with set up. OT Short Term Goal 2 - Progress (Week 1): Met OT Short Term Goal 3 (Week 1): Pt will don pants with min assist. OT Short Term Goal 3 - Progress (Week 1): Met OT Short Term Goal 4 (Week 1): Pt will stand at sink to brush teeth with supervision. OT Short Term Goal 4 - Progress (Week 1): Met OT Short Term Goal 5 (Week 1): Pt will transfer to shower chair with min assist. OT Short Term Goal 5 - Progress (Week 1): Met Week 2:  OT Short Term Goal 1 (Week 2): Pt will ambulate to toilet with RW with supervision. OT Short Term Goal 2 (Week 2): Pt will don underwear and pants with supervision. OT Short Term Goal 3 (Week 2): Pt will  bathe with supervision. OT Short Term Goal 4 (Week 2): Pt will use LUE as a gross assist in bimanual tasks.  Skilled Therapeutic Interventions/Progress Updates:    Pt seen for ADL retraining of bathing and dressing with a focus on LUE active movement and dynamic balance.  Pt is able to wash 80% of right arm with left, she uses long handled sponge for other 20%.  Pt no longer needs walker splint and required 1-2 cues to push up from chair versus walker.  Stood to don bra to challenge dynamic balance. Continues to have difficulty fastening bra and donning socks and shoes.  Therapy Documentation Precautions:  Precautions Precautions: Fall Precaution Comments: left weakness UE>LE Restrictions Weight Bearing Restrictions: No Pain: Pain Assessment Pain Assessment: No/denies pain ADL:  See FIM for current functional status  Therapy/Group: Individual  Therapy  Dory Verdun 08/03/2012, 9:05 AM

## 2012-08-03 NOTE — Progress Notes (Signed)
Occupational Therapy Session Note  Patient Details  Name: [name] MRN: [id number] Date of Birth: December 03, 1950  Today's Date: 08/03/2012 Time: 1035-1120 Time Calculation (min): 45 min  Short Term Goals: Week 1:  OT Short Term Goal 1 (Week 1): Pt will ambulate to toilet with min assist with RW. OT Short Term Goal 1 - Progress (Week 1): Met OT Short Term Goal 2 (Week 1): Pt will don shirt with set up. OT Short Term Goal 2 - Progress (Week 1): Met OT Short Term Goal 3 (Week 1): Pt will don pants with min assist. OT Short Term Goal 3 - Progress (Week 1): Met OT Short Term Goal 4 (Week 1): Pt will stand at sink to brush teeth with supervision. OT Short Term Goal 4 - Progress (Week 1): Met OT Short Term Goal 5 (Week 1): Pt will transfer to shower chair with min assist. OT Short Term Goal 5 - Progress (Week 1): Met Week 2:  OT Short Term Goal 1 (Week 2): Pt will ambulate to toilet with RW with supervision. OT Short Term Goal 2 (Week 2): Pt will don underwear and pants with supervision. OT Short Term Goal 3 (Week 2): Pt will  bathe with supervision. OT Short Term Goal 4 (Week 2): Pt will use LUE as a gross assist in bimanual tasks.  Skilled Therapeutic Interventions/Progress Updates: Pt seen for family education with her husband and self care training of tub bench transfers/ shower stall transfers and ADL retraining of simple home management tasks.  Pt has both walk in and tub at home.  Demonstrated and discussed both types of transfers.  Husband thinks tub bench will be the best.  Discussed the need for supervision with these tasks.  Pt ambulated to room with RW with supervision.  Pt worked on LUE active movement with folding clothing and putting items back into her drawers. Pt and husband stated that she would like to have an earlier discharge based on his availability to supervise her at home this weekend.  LTGs will be modified to supervision, based on cognitive deficits of awareness and  memory.     Therapy Documentation Precautions:  Precautions Precautions: Fall Precaution Comments: left weakness UE>LE Restrictions Weight Bearing Restrictions: No   Pain: Pain Assessment Pain Assessment: No/denies pain ADL:  See FIM for current functional status  Therapy/Group: Individual Therapy  SAGUIER,JULIA 08/03/2012, 11:24 AM

## 2012-08-03 NOTE — Consult Note (Signed)
08/02/12  NEUROCOGNITIVE TESTING - [facility]   [name] is a 61 year old, left-handed, married African-American female with 16 years of education, who reported experiencing mild cognitive deficits following a cerebrovascular accident she recently suffered. She was referred for neuropsychological consultation given the possibility of cognitive sequelae subsequent to her current medical status and in order to assist in treatment planning and assess for dementia.   According to medical records, a brain MRI scan performed reportedly revealed global atrophy without hydrocephalus, an acute non hemorrhagic posterior right frontal lobe infarct, a remote right pontine infarct and bilateral thalamic/basal ganglia infarcts, as well as prominent small vessel type changes.   PROCEDURES: [3 units of 16109 on 08/02/12]  Diagnostic Interview Medical record review Behavioral observations  The following tests were performed during today's visit: Repeatable Battery for the Assessment of Neuropsychological Status (RBANS, form B), and the Geriatric Depression Scale (short form).  Test results are as follows:   Of note, some tasks could not be administered due to certain physical limitations.    RBANS Indices Scaled Score Percentile Description  Immediate Memory  53 <1 Markedly impaired  Language 87 19 Below average    RBANS Subtests Raw Score Percentile Description  List Learning 16 1 Markedly impaired  Story Memory 7 <1 Markedly impaired  Line Orientation 3 <1 Markedly impaired  Picture Naming 9 32 Average  Semantic Fluency 15 12 Below average  Digit Span 7 7 Borderline impaired  List Recall 0 <1 Markedly impaired  List Recognition 16 <1 Markedly impaired  Story Recall 5 3 Impaired    Geriatric Depression Scale (short form) Raw Score = 9/15 Description = Moderate depression   Cognitive Evaluation: Test results revealed reduced (if not markedly  impaired) functioning in all cognitive domains and thinking skills assessed during this evaluation with the exception of intact simple naming.   Emotional & Behavioral Evaluation: [name] was appropriately dressed for season and situation, and she appeared tidy and well-groomed. Normal posture was noted. She was friendly and rapport easily established. Her speech was as expected and she was able to express ideas effectively. She exhibited a left facial droop and reported experiencing weakness in her left upper and lower extremities. She had trouble understanding directions at times. Her affect was somewhat flat. Attention and motivation were good. Optimal test taking conditions were maintained.  From an emotional standpoint, on a self report inventory, [name] endorsed experiencing at least a moderate degree of depression. Suicidal/homicidal ideation, plan or intent was denied. No manic or hypomanic episodes were reported. The patient denied ever experiencing any auditory/visual hallucinations. No major behavioral or personality changes were endorsed.    Overall, [name] performance is consistent with a dementia diagnosis with significant cerebrovascular compromise being the most likely etiology. While emotional factors may be exacerbating her deficits, they are seemingly not the driving force behind her symptoms.   In light of these findings, the following recommendations are provided.    RECOMMENDATIONS  Recommendations for treatment team:    Consider implementing a nootropic medication such as donepezil.    Since emotional factors are likely adversely impacting the patient's daily life, possibly implementing an antidepressant such as fluoxetine may be beneficial.    When interacting with [name], directions and information should be provided in a simple, straight forward manner, and the treatment team should avoid giving multiple instructions simultaneously.    She may  also benefit from being provided with multiple trials to learn new skills  given the noted memory inefficiencies.    To the extent possible, multitasking should be avoided.   [name] requires more time than typical to process information. The treatment team may benefit from waiting for a verbal response to information before presenting additional information.    Be aware that the patient is suffering from significant visual spatial deficits of which she does not seem to be aware. Taking this into consideration will help tailor therapy and keep accidents at bay as much as possible when she is trying to navigate her surroundings.    Performance will generally be best in a structured, routine, and familiar environment, as opposed to situations involving complex problems.   Recommendations for discharge planning:    Due to the nature and severity of the symptoms noted during this evaluation, it is recommended that she obtain 24-hour care and supervision following this hospitalization.    Family education will be very important given the patient's lack of insight.    Complete a comprehensive neuropsychological evaluation as an outpatient in 8-12 months to assess for interval change.   Establish long-term follow-up care with a care provider learned in post stroke and dementia care.    Maintain engagement in mentally, physically and cognitively stimulating activities.    Strive to maintain a healthy lifestyle (e.g., proper diet and exercise) in order to promote physical, cognitive and emotional health.    Establishing a power of attorney is warranted.    The patient should refrain from driving.    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  Clinical Neuropsychologist

## 2012-08-03 NOTE — Progress Notes (Signed)
Social Work Patient ID: [name], female   DOB: [birth year], 61 y.o.   MRN: [id number] Met with pt and husband who was here for family education.  Really no one to be with while husband works.  Discussed going to Mom's home or hiring assist. Do not have the funds to private pay and wants to stay home.  Husband reports stayed alone before with last stroke.  Asked if pt back to baseline?  Husband thinks so, reports:"She really Didn't do anything before this."  When discussing cooking both report she stopped after forgetting and leaving something on the stove with last stroke.  Husband reports she doesn't cook. Wants to go home Friday due to husband off this weekend and he could be there with her and then play it by ear and see how pt does.  Both aware of team's concerns.  Pt to talk with her Mom again about Options.

## 2012-08-04 ENCOUNTER — Inpatient Hospital Stay (HOSPITAL_COMMUNITY): Payer: BC Managed Care – PPO | Admitting: Occupational Therapy

## 2012-08-04 ENCOUNTER — Inpatient Hospital Stay (HOSPITAL_COMMUNITY): Payer: BC Managed Care – PPO | Admitting: Speech Pathology

## 2012-08-04 LAB — GLUCOSE, CAPILLARY
Glucose-Capillary: 123 mg/dL — ABNORMAL HIGH (ref 70–99)
Glucose-Capillary: 110 mg/dL — ABNORMAL HIGH (ref 70–99)
Glucose-Capillary: 130 mg/dL — ABNORMAL HIGH (ref 70–99)

## 2012-08-04 MED ORDER — OXYBUTYNIN CHLORIDE ER 10 MG PO TB24
10.0000 mg | ORAL_TABLET | Freq: Every day | ORAL | Status: DC
Start: 2012-08-04 — End: 2012-08-06
  Administered 2012-08-04 – 2012-08-05 (×2): 10 mg via ORAL
  Filled 2012-08-04 (×4): qty 1

## 2012-08-04 NOTE — Progress Notes (Signed)
Patient ID: [name], female   DOB: 11-08-1950, 61 y.o.   MRN: [id number]  Subjective/Complaints: No problems overnite, still with urge incont.No issues overnite, "am I going home Friday?" Review of Systems  HENT:       Dry mouth  Genitourinary: Positive for urgency and frequency. Negative for dysuria.  Neurological: Positive for focal weakness and weakness.    Objective: Vital Signs: Blood pressure 162/89, pulse 51, temperature 97.5 F (36.4 C), temperature source Oral, resp. rate 17, weight 91.2 kg (201 lb 1 oz), SpO2 98.00%. No results found. Results for orders placed during the hospital encounter of 07/22/12 (from the past 72 hour(s))  GLUCOSE, CAPILLARY     Status: Abnormal   Collection Time   08/01/12 11:18 AM      Component Value Range Comment   Glucose-Capillary 181 (*) 70 - 99 mg/dL    Comment 1 Notify RN     GLUCOSE, CAPILLARY     Status: Normal   Collection Time   08/01/12  4:43 PM      Component Value Range Comment   Glucose-Capillary 78  70 - 99 mg/dL    Comment 1 Notify RN     GLUCOSE, CAPILLARY     Status: Abnormal   Collection Time   08/01/12  8:42 PM      Component Value Range Comment   Glucose-Capillary 211 (*) 70 - 99 mg/dL    Comment 1 Notify RN     GLUCOSE, CAPILLARY     Status: Abnormal   Collection Time   08/02/12  7:23 AM      Component Value Range Comment   Glucose-Capillary 132 (*) 70 - 99 mg/dL    Comment 1 Notify RN     GLUCOSE, CAPILLARY     Status: Abnormal   Collection Time   08/02/12  8:56 AM      Component Value Range Comment   Glucose-Capillary 132 (*) 70 - 99 mg/dL   GLUCOSE, CAPILLARY     Status: Abnormal   Collection Time   08/02/12 11:40 AM      Component Value Range Comment   Glucose-Capillary 144 (*) 70 - 99 mg/dL    Comment 1 Notify RN     GLUCOSE, CAPILLARY     Status: Abnormal   Collection Time   08/02/12  4:23 PM      Component Value Range Comment   Glucose-Capillary 168 (*) 70 - 99 mg/dL    Comment 1 Notify RN       GLUCOSE, CAPILLARY     Status: Abnormal   Collection Time   08/02/12  8:57 PM      Component Value Range Comment   Glucose-Capillary 101 (*) 70 - 99 mg/dL    Comment 1 Notify RN     GLUCOSE, CAPILLARY     Status: Abnormal   Collection Time   08/03/12  7:46 AM      Component Value Range Comment   Glucose-Capillary 123 (*) 70 - 99 mg/dL   GLUCOSE, CAPILLARY     Status: Abnormal   Collection Time   08/03/12 11:31 AM      Component Value Range Comment   Glucose-Capillary 114 (*) 70 - 99 mg/dL   GLUCOSE, CAPILLARY     Status: Abnormal   Collection Time   08/03/12  4:41 PM      Component Value Range Comment   Glucose-Capillary 117 (*) 70 - 99 mg/dL   GLUCOSE, CAPILLARY     Status:  Abnormal   Collection Time   08/03/12  8:43 PM      Component Value Range Comment   Glucose-Capillary 131 (*) 70 - 99 mg/dL    Comment 1 Notify RN        HEENT: normal Cardio: RRR Resp: CTA B/L GI: BS positive Extremity:  No Edema Skin:   Intact Neuro: Alert/Oriented, Cranial Nerve II-XII normal, Normal Sensory, Abnormal Motor R side 5/5, L side 1/5 distal, 2-/5 prox UE, 3/5 LLE and Abnormal FMC Ataxic/ dec FMC Musc/Skel:  Normal visula field intact to confrontation testing, no extinction  Assessment/Plan: 1. 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 Physiatrist is providing close team supervision and 24 hour management of active medical problems listed below. Physiatrist and rehab team continue to assess barriers to discharge/monitor patient progress toward functional and medical goals.  Team conference today FIM: FIM - Bathing Bathing Steps Patient Completed: Chest;Right Arm;Left Arm;Abdomen;Front perineal area;Buttocks;Right upper leg;Left upper leg;Right lower leg (including foot);Left lower leg (including foot) Bathing: 5: Supervision: Safety issues/verbal  cues  FIM - Upper Body Dressing/Undressing Upper body dressing/undressing steps patient completed: Thread/unthread right bra strap;Thread/unthread left bra strap;Thread/unthread right sleeve of pullover shirt/dresss;Thread/unthread left sleeve of pullover shirt/dress;Pull shirt over trunk Upper body dressing/undressing: 4: Min-Patient completed 75 plus % of tasks FIM - Lower Body Dressing/Undressing Lower body dressing/undressing steps patient completed: Thread/unthread right underwear leg;Thread/unthread left underwear leg;Pull underwear up/down;Pull pants up/down;Thread/unthread left pants leg;Thread/unthread right pants leg Lower body dressing/undressing: 3: Mod-Patient completed 50-74% of tasks  FIM - Toileting Toileting steps completed by patient: Adjust clothing prior to toileting;Performs perineal hygiene;Adjust clothing after toileting Toileting Assistive Devices: Grab bar or rail for support Toileting: 5: Supervision: Safety issues/verbal cues  FIM - Diplomatic Services operational officer Devices: Building control surveyor Transfers: 5-To toilet/BSC: Supervision (verbal cues/safety issues);5-From toilet/BSC: Supervision (verbal cues/safety issues)  FIM - Banker Devices: Walker;Bed rails;Arm rests Bed/Chair Transfer: 5: Supine > Sit: Supervision (verbal cues/safety issues);5: Sit > Supine: Supervision (verbal cues/safety issues);5: Bed > Chair or W/C: Supervision (verbal cues/safety issues);5: Chair or W/C > Bed: Supervision (verbal cues/safety issues)  FIM - Locomotion: Wheelchair Locomotion: Wheelchair: 0: Activity did not occur FIM - Locomotion: Ambulation Locomotion: Ambulation Assistive Devices: Walker - Rolling;Cane - Straight Ambulation/Gait Assistance: 4: Min guard Locomotion: Ambulation: 4: Travels 150 ft or more with minimal assistance (Pt.>75%)  Comprehension Comprehension Mode: Auditory Comprehension: 5-Understands  basic 90% of the time/requires cueing < 10% of the time  Expression Expression Mode: Verbal Expression: 5-Expresses basic 90% of the time/requires cueing < 10% of the time.  Social Interaction Social Interaction: 5-Interacts appropriately 90% of the time - Needs monitoring or encouragement for participation or interaction.  Problem Solving Problem Solving: 5-Solves basic 90% of the time/requires cueing < 10% of the time  Memory Memory: 5-Recognizes or recalls 90% of the time/requires cueing < 10% of the time  Medical Problem List and Plan:  1. DVT Prophylaxis/Anticoagulation: Pharmaceutical: Lovenox  2. Pain Management: started on neurontin for left shoulder pain. Add ice prn. Will monitor.  3. Mood: continue celexa and effexor daily. LCSW to follow for formal evaluation.  4. Neuropsych: This patient is capable of making decisions on his/her own behalf.  5. CAD: On plavix and lopressor. Lipitor added at this admission.  6. DM type 2: monitor with ac/hs CBG checks. Resume metformin bid 7. HTN; monitor with  bid checks. Continue cozaar and lopressor, reduce effexor and monitor 8.  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

## 2012-08-04 NOTE — Patient Care Conference (Signed)
Inpatient RehabilitationTeam Conference Note Date: 08/04/2012   Time: 10:15 AM    Patient Name: [name]      Medical Record Number: [id number]  Date of Birth: 08-10-1951 Sex: Female         Room/Bed: 4036/4036-01 Payor Info: Payor: BLUE CROSS BLUE SHIELD  Plan: BCBS PPO OUT OF STATE  Product Type: *No Product type*     Admitting Diagnosis: RT CVA  Admit Date/Time:  07/22/2012  7:42 PM Admission Comments: No comment available   Primary Diagnosis:  CVA (cerebral infarction) Principal Problem: CVA (cerebral infarction)  Patient Active Problem List   Diagnosis Date Noted  . Stroke 07/21/2012  . CVA (cerebral infarction) 07/21/2012  . Left-sided weakness 07/19/2012  . HTN (hypertension) 07/19/2012  . HLD (hyperlipidemia) 07/19/2012  . Depression 07/19/2012  . Diabetes mellitus 07/19/2012  . ALLERGIC RHINITIS 01/21/2008  . AMI 11/04/2007  . BRONCHITIS 11/04/2007  . HYPERSOMNIA, ASSOCIATED WITH SLEEP APNEA 11/04/2007  . MUSCULOSKELETAL PAIN 11/04/2007  . NEPHRECTOMY, HX OF 11/04/2007  . Other Acquired Absence of Organ 11/04/2007    Expected Discharge Date: Expected Discharge Date: 08/06/12  Team Members Present: Physician: Dr. Claudette Laws Social Worker Present: Dossie Der, LCSW Nurse Present: Other (comment) Feliz Beam) PT Present: Edman Circle, PT;Caroline Adriana Simas, PT OT Present: Bretta Bang, Verlene Mayer, OT SLP Present: Fae Pippin, SLP Other (Discipline and Name): Charolette Child Coordinator     Current Status/Progress Goal Weekly Team Focus  Medical   poor safety awareness, asymptomatic bradycardia due to beta blocker  educate pt and caregiver regarding mobility/safety issues  adjust bladder meds   Bowel/Bladder   Incontinent at times of bladder, continent of bowel  continent of bowel and bladder  continent of bowel and bladder   Swallow/Nutrition/ Hydration             ADL's   min assist with bra and socks/shoes, supervision with all  other selfcare tasks  goals downgraded to supervision based on cognitive deficits of memory, awareness  LUE neuro re-ed, balance, functional mobility, pt education   Mobility   supervision transfers and gait x 150', min assist 5 steps, highly distractible to environment  modified independent basic transfers and w/c mobility x 150'; downgraded gait goal to supervision x 150' and up/down 5 steps  balance, high level gait, awareness, family ed done 10/8   Communication             Safety/Cognition/ Behavioral Observations  min assist  supervision   increase recall and carryoer of new information    Pain   denied pain/ acetaminophen 325-650 mg PRN, ultram 50 mg PRN,   free of pain/ less or equal to 2  free of pain/ less or equal to 2   Skin   No skin issues  remain free of skin breakdown  remain free of skin breakdown      *See Interdisciplinary Assessment and Plan and progress notes for long and short-term goals  Barriers to Discharge: husband works 12/day    Possible Resolutions to Barriers:  ? son to assist    Discharge Planning/Teaching Needs:  Husband here yesterday for family education.  Aware of the team's recommendation of 24 hr supervision-usnire if will have at discharge.  Pt wants to discharg ehome Friday so husband can be with all weekend, see how she does.      Team Discussion:  Pt has reached supervision level goals but has memory issues and recommendation is 24 hr supervision-husband and pt aware but  unsure if will have at home.  Options discussed with both. Family education yesterday.  Revisions to Treatment Plan:  Per pt request moved up discharge to Friday so husband can be there with her all weekend.   Continued Need for Acute Rehabilitation Level of Care: The patient requires daily medical management by a physician with specialized training in physical medicine and rehabilitation for the following conditions: Daily direction of a multidisciplinary physical  rehabilitation program to ensure safe treatment while eliciting the highest outcome that is of practical value to the patient.: Yes Daily medical management of patient stability for increased activity during participation in an intensive rehabilitation regime.: Yes Daily analysis of laboratory values and/or radiology reports with any subsequent need for medication adjustment of medical intervention for : Neurological problems  Teige Rountree, Lemar Livings 08/04/2012, 1:28 PM

## 2012-08-04 NOTE — Progress Notes (Signed)
Occupational Therapy Session Note  Patient Details  Name: SAMAMTHA TIEGS MRN: [id number] Date of Birth: November 28, 1950  Today's Date: 08/04/2012 Time: 0804-0901 and 1100-1130 Time Calculation (min): 57 min and 30 min  Short Term Goals: Week 1:  OT Short Term Goal 1 (Week 1): Pt will ambulate to toilet with min assist with RW. OT Short Term Goal 1 - Progress (Week 1): Met OT Short Term Goal 2 (Week 1): Pt will don shirt with set up. OT Short Term Goal 2 - Progress (Week 1): Met OT Short Term Goal 3 (Week 1): Pt will don pants with min assist. OT Short Term Goal 3 - Progress (Week 1): Met OT Short Term Goal 4 (Week 1): Pt will stand at sink to brush teeth with supervision. OT Short Term Goal 4 - Progress (Week 1): Met OT Short Term Goal 5 (Week 1): Pt will transfer to shower chair with min assist. OT Short Term Goal 5 - Progress (Week 1): Met Week 2:  OT Short Term Goal 1 (Week 2): Pt will ambulate to toilet with RW with supervision. OT Short Term Goal 2 (Week 2): Pt will don underwear and pants with supervision. OT Short Term Goal 3 (Week 2): Pt will  bathe with supervision. OT Short Term Goal 4 (Week 2): Pt will use LUE as a gross assist in bimanual tasks.  Skilled Therapeutic Interventions/Progress Updates:    Visit 1:    Pt seen for BADL retraining of toileting, bathing, and dressing with a focus on safe ambulation techniques, standing balance, and active use of LUE.   She continues to need cues to start pants over LLE first.  Pt did use RW safely.  Mod I with bathing and supervision to transfer into shower. Good use of LUE with donning socks.  Visit 2: Pt worked on IT consultant by starting a load of laundry. Used LUE to place clothing in washer.  LUE arom exercises with wall slides, bimanual dowel exercises for increased shoulder flexion and grasp.  Therapy Documentation Precautions:  Precautions Precautions: Fall Precaution Comments: left weakness  UE>LE Restrictions Weight Bearing Restrictions: No  Pain: Pain Assessment Pain Assessment: No/denies pain ADL:  See FIM for current functional status  Therapy/Group: Individual Therapy  SAGUIER,JULIA 08/04/2012, 9:02 AM

## 2012-08-04 NOTE — Progress Notes (Signed)
Physical Therapy Session Note  Patient Details  Name: [name] MRN: [id number] Date of Birth: 05/28/1951  Today's Date: 08/04/2012 Time: 0905-1003 Time Calculation (min): 58 min  Short Term Goals: Week 2:  PT Short Term Goal 1 (Week 2): pt will perform bed mobility with S PT Short Term Goal 2 (Week 2): pt will perform basic transfer consistently with supervision, with 1 or less cue for safe use of Rw PT Short Term Goal 3 (Week 2): pt will perform gait x 150 with supervision/min assist  Skilled Therapeutic Interventions/Progress Updates:    This session focused on gait with RW and with single point cane working on increased step length on her left foot.  Increased balance deficits while trying to walk and talk with people in the hallway.  STM deficits apparent as cueing does not carryover throughout the session.  Practiced stairs with left rail to simulate home and car transfers from medium and high height cars.  Neuro re ed for balance training and TE for leg strength in standing and endurance and leg strength on NuStep.  See details below.    Therapy Documentation Precautions:  Precautions Precautions: Fall Precaution Comments: left weakness UE>LE Restrictions Weight Bearing Restrictions: No   Pain: Pain Assessment Pain Assessment: No/denies pain Pain Score: 0-No pain Mobility: Transfers Sit to Stand: 5: Supervision;With upper extremity assist;From chair/3-in-1 Sit to Stand Details: Verbal cues for precautions/safety Sit to Stand Details (indicate cue type and reason): verbal cues needed for safe hand placement despite sign on her WC Stand to Sit: 5: Supervision;With upper extremity assist;To chair/3-in-1;With armrests Stand to Sit Details (indicate cue type and reason): Verbal cues for precautions/safety Locomotion : Ambulation Ambulation: Yes Ambulation/Gait Assistance: 4: Min guard Ambulation Distance (Feet): 400 Feet Assistive device: Rolling walker;Straight  cane Ambulation/Gait Assistance Details: Verbal cues for technique;Verbal cues for sequencing;Verbal cues for precautions/safety Ambulation/Gait Assistance Details: supervision with RW with verbal cues for longer step length left leg, especially as she fatigued.  Min guard assist with SPC and verbal cues for correct sequencing of cane and feet.   Stairs / Additional Locomotion Stairs: Yes Stairs Assistance: 4: Min guard Stairs Assistance Details: Verbal cues for sequencing;Verbal cues for technique;Verbal cues for precautions/safety Stair Management Technique: One rail Left;Forwards (step to pattern) Number of Stairs: 8  Height of Stairs: 6  Wheelchair Mobility Wheelchair Mobility: Yes Wheelchair Assistance: 5: Supervision Wheelchair Assistance Details: Visual cues/gestures for sequencing;Verbal cues for Engineer, drilling: Right upper extremity;Both lower extermities Wheelchair Parts Management: Needs assistance Distance: 80      Balance: Static Standing Balance Static Standing - Balance Support: No upper extremity supported Static Standing - Level of Assistance: 5: Stand by assistance Static Standing - Comment/# of Minutes: semi tandem stand multiple trials both legs Dynamic Standing Balance Dynamic Standing - Balance Support: No upper extremity supported Dynamic Standing - Level of Assistance: 4: Min assist Exercises: General Exercises - Lower Extremity Hip Flexion/Marching: AROM;Both;10 reps;Standing Other Exercises Other Exercises: standing marches x 10 each bil  Other Exercises: Nustep level 6 x 10 mins UE/LE  See FIM for current functional status  Therapy/Group: Individual Therapy  Lurena Joiner B. Ikey Omary, PT, DPT [phone]   08/04/2012, 11:40 AM

## 2012-08-04 NOTE — Progress Notes (Signed)
Social Work Patient ID: [name], female   DOB: [birth year], 61 y.o.   MRN: [id number] Met with pt to inform team feels she can go home Friday, but still recommends 24 hour supervision. She will talk with her Mom, but reports her husband wants her to stay here so he can be with her when he Is not working.  Pt reports she had memory issues from first CVA and feels they are no worse.  Husband reports: " She doesn't do  Anything but sit around while I am gone."  Both aware of the team's concerns and will do what they feel is best.  Pleased with discharge date now.

## 2012-08-04 NOTE — Progress Notes (Signed)
Speech Language Pathology Daily Session Note  Patient Details  Name: [name] MRN: [id number] Date of Birth: 08/07/1951  Today's Date: 08/04/2012 Time: 1520-1605 Time Calculation (min): 45 min  Short Term Goals: Week 2: SLP Short Term Goal 1 (Week 2): Patient will solve complex problems with supervision level verbal cues  SLP Short Term Goal 2 (Week 2): Patient will recall steps of a transfer sequence as taught by PT/OT with supervision verbal cues  SLP Short Term Goal 3 (Week 2): Patient will recall new medication name and functions with supervision vebral cues to utilize external aids   Skilled Therapeutic Interventions: Treatment session focused on addressing complex problem solving during self-care task.  SLP facilitated session with mod faded to min assist verbal and visual cues to accurately load a medication box.  Cues addressed mental flexibility, self-monitoring and correcting.     FIM:  Comprehension Comprehension Mode: Auditory Comprehension: 5-Understands basic 90% of the time/requires cueing < 10% of the time Expression Expression Mode: Verbal Expression: 5-Expresses basic 90% of the time/requires cueing < 10% of the time. Social Interaction Social Interaction: 5-Interacts appropriately 90% of the time - Needs monitoring or encouragement for participation or interaction. Problem Solving Problem Solving: 5-Solves basic 90% of the time/requires cueing < 10% of the time Memory Memory: 5-Recognizes or recalls 90% of the time/requires cueing < 10% of the time  Pain Pain Assessment Pain Assessment: No/denies pain  Therapy/Group: Individual Therapy  Charlane Ferretti., CCC-SLP [phone]  Genevieve Arbaugh 08/04/2012, 4:53 PM

## 2012-08-05 ENCOUNTER — Encounter (HOSPITAL_COMMUNITY): Payer: BC Managed Care – PPO | Admitting: Occupational Therapy

## 2012-08-05 ENCOUNTER — Inpatient Hospital Stay (HOSPITAL_COMMUNITY): Payer: BC Managed Care – PPO | Admitting: Speech Pathology

## 2012-08-05 ENCOUNTER — Inpatient Hospital Stay (HOSPITAL_COMMUNITY): Payer: BC Managed Care – PPO | Admitting: Occupational Therapy

## 2012-08-05 ENCOUNTER — Inpatient Hospital Stay (HOSPITAL_COMMUNITY): Payer: BC Managed Care – PPO

## 2012-08-05 LAB — GLUCOSE, CAPILLARY
Glucose-Capillary: 195 mg/dL — ABNORMAL HIGH (ref 70–99)
Glucose-Capillary: 80 mg/dL (ref 70–99)
Glucose-Capillary: 118 mg/dL — ABNORMAL HIGH (ref 70–99)

## 2012-08-05 NOTE — Progress Notes (Signed)
Occupational Therapy Discharge Summary  Patient Details  Name: [name] MRN: [id number] Date of Birth: [birth year]  Today's Date: 08/05/2012  Patient has met 11 of 11 long term goals due to improved activity tolerance, improved balance, postural control, ability to compensate for deficits, functional use of  LEFT upper extremity, improved attention, improved awareness and improved coordination.  Patient to discharge at overall Supervision level.  Patient's care partner is independent to provide the necessary physical and cognitive assistance at discharge.    Reasons goals not met: n/a  Recommendation:  Patient will benefit from ongoing skilled OT services in home health setting to continue to advance functional skills in the area of BADL and iADL.  Equipment: No equipment provided  Reasons for discharge: treatment goals met  Patient/family agrees with progress made and goals achieved: Yes  OT Discharge  Pain Pain Assessment Pain Assessment: No/denies pain ADL Overall supervision level - Refer to FIM   Vision/Perception  Vision - Assessment Eye Alignment: Within Functional Limits Perception Perception: Within Functional Limits Inattention/Neglect: Appears intact Praxis Praxis: Intact  Cognition Overall Cognitive Status: Impaired Memory: Impaired Memory Impairment: Decreased recall of new information;Decreased short term memory Decreased Short Term Memory: Verbal basic;Functional basic Awareness: Impaired Awareness Impairment: Anticipatory impairment Safety/Judgment: Impaired Sensation Sensation Light Touch: Appears Intact Stereognosis: Appears Intact Hot/Cold: Appears Intact Proprioception: Appears Intact Coordination Gross Motor Movements are Fluid and Coordinated: No Fine Motor Movements are Fluid and Coordinated: No Coordination and Movement Description: Decreased timing, FMC is improving to allow her to use hand as active assist Heel Shin Test:  decreased excursion, speed, and accuracy bilaterally Motor  Motor Motor: Hemiplegia;Abnormal postural alignment and control Motor - Discharge Observations: left trunk shortening Mobility  Overall supervision with RW to toilet and shower; refer to FIM    Trunk/Postural Assessment  Cervical Assessment Cervical Assessment: Within Functional Limits Thoracic Assessment Thoracic Assessment: Within Functional Limits Lumbar Assessment Lumbar Assessment: Within Functional Limits Postural Control Trunk Control: In dynamic standing pt has left trunk shortening  Balance Static Sitting Balance Static Sitting - Level of Assistance: 7: Independent Dynamic Sitting Balance Dynamic Sitting - Level of Assistance: 7: Independent Static Standing Balance Static Standing - Level of Assistance: 5: Stand by assistance Dynamic Standing Balance Dynamic Standing - Level of Assistance: 5: Stand by assistance Extremity/Trunk Assessment RUE Assessment RUE Assessment: Within Functional Limits LUE Assessment LUE Assessment: Exceptions to [facility] LUE Strength LUE Overall Strength Comments: Pt now has 80 degrees of shoulder flexion and abduction, 100 degrees of elbow of flexion, 3-/5 grasp Left Shoulder Flexion: 3-/5 Gross Grasp: Impaired  See FIM for current functional status  SAGUIER,JULIA 08/05/2012, 9:51 AM

## 2012-08-05 NOTE — Progress Notes (Signed)
Social Work Patient ID: [name], female   DOB: 1951/10/06, 61 y.o.   MRN: [id number] Met with pt and team, pt reports PT made her realize she can not be home alone when she tried to get up off the floor and could not in therapy. She wants to know her discharge options.  Again we discussed home with family-Mom's home, NHP or hired assist.  Pt really just needs someone there Not necessary a skilled facility.  Have checked her BCBS they will not cover SNF due to they feel she is custodial care at supervision level.  Can try her Medicare coverage which May cover 20 days, but she still may require supervision level once she leaves there due to her memory issues.  When discussed NH pt reports her father was in one and doesn't want this. She will discuss with her husband tonight.  Can  move discharge back to Monday to give her time to come up with a plan.  At this point will let her discuss with husband tonight When he visits and see early am to discuss plan.  Probably best option is go home with husband this weekend and see how she does and then to [place] with Mom while husband works Next week.

## 2012-08-05 NOTE — Progress Notes (Signed)
Patient ID: [name], female   DOB: [birth year], 61 y.o.   MRN: [id number]  Subjective/Complaints: No problems overnite, still with urge incont.No issues overnite, "am I going home Friday?" Discussed Team rec for 24/7 Sup No driving Pt willing to go to Mom's in [place] Review of Systems  HENT:       Dry mouth  Genitourinary: Positive for urgency and frequency. Negative for dysuria.  Neurological: Positive for focal weakness and weakness.    Objective: Vital Signs: Blood pressure 160/72, pulse 86, temperature 97.6 F (36.4 C), temperature source Oral, resp. rate 19, weight 91.2 kg (201 lb 1 oz), SpO2 92.00%. No results found. Results for orders placed during the hospital encounter of 07/22/12 (from the past 72 hour(s))  GLUCOSE, CAPILLARY     Status: Abnormal   Collection Time   08/02/12  7:23 AM      Component Value Range Comment   Glucose-Capillary 132 (*) 70 - 99 mg/dL    Comment 1 Notify RN     GLUCOSE, CAPILLARY     Status: Abnormal   Collection Time   08/02/12  8:56 AM      Component Value Range Comment   Glucose-Capillary 132 (*) 70 - 99 mg/dL   GLUCOSE, CAPILLARY     Status: Abnormal   Collection Time   08/02/12 11:40 AM      Component Value Range Comment   Glucose-Capillary 144 (*) 70 - 99 mg/dL    Comment 1 Notify RN     GLUCOSE, CAPILLARY     Status: Abnormal   Collection Time   08/02/12  4:23 PM      Component Value Range Comment   Glucose-Capillary 168 (*) 70 - 99 mg/dL    Comment 1 Notify RN     GLUCOSE, CAPILLARY     Status: Abnormal   Collection Time   08/02/12  8:57 PM      Component Value Range Comment   Glucose-Capillary 101 (*) 70 - 99 mg/dL    Comment 1 Notify RN     GLUCOSE, CAPILLARY     Status: Abnormal   Collection Time   08/03/12  7:46 AM      Component Value Range Comment   Glucose-Capillary 123 (*) 70 - 99 mg/dL   GLUCOSE, CAPILLARY     Status: Abnormal   Collection Time   08/03/12 11:31 AM      Component Value Range Comment   Glucose-Capillary 114 (*) 70 - 99 mg/dL   GLUCOSE, CAPILLARY     Status: Abnormal   Collection Time   08/03/12  4:41 PM      Component Value Range Comment   Glucose-Capillary 117 (*) 70 - 99 mg/dL   GLUCOSE, CAPILLARY     Status: Abnormal   Collection Time   08/03/12  8:43 PM      Component Value Range Comment   Glucose-Capillary 131 (*) 70 - 99 mg/dL    Comment 1 Notify RN     GLUCOSE, CAPILLARY     Status: Abnormal   Collection Time   08/04/12  7:30 AM      Component Value Range Comment   Glucose-Capillary 110 (*) 70 - 99 mg/dL   GLUCOSE, CAPILLARY     Status: Abnormal   Collection Time   08/04/12 11:51 AM      Component Value Range Comment   Glucose-Capillary 130 (*) 70 - 99 mg/dL   GLUCOSE, CAPILLARY     Status: Normal   Collection  Time   08/04/12  4:30 PM      Component Value Range Comment   Glucose-Capillary 81  70 - 99 mg/dL    Comment 1 Notify RN     GLUCOSE, CAPILLARY     Status: Abnormal   Collection Time   08/04/12  8:50 PM      Component Value Range Comment   Glucose-Capillary 131 (*) 70 - 99 mg/dL   GLUCOSE, CAPILLARY     Status: Abnormal   Collection Time   08/05/12  7:12 AM      Component Value Range Comment   Glucose-Capillary 114 (*) 70 - 99 mg/dL    Comment 1 Notify RN        HEENT: normal Cardio: RRR Resp: CTA B/L GI: BS positive Extremity:  No Edema Skin:   Intact Neuro: Alert/Oriented, Cranial Nerve II-XII normal, Normal Sensory, Abnormal Motor R side 5/5, L side 1/5 distal, 2-/5 prox UE, 3/5 LLE and Abnormal FMC Ataxic/ dec FMC Musc/Skel:  Normal visula field intact to confrontation testing, no extinction  Assessment/Plan: 1. 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 Physiatrist is providing close team supervision and 24 hour management of active medical problems listed below. Physiatrist and rehab team continue to  assess barriers to discharge/monitor patient progress toward functional and medical goals.  Team conference today FIM: FIM - Bathing Bathing Steps Patient Completed: Chest;Right Arm;Left Arm;Abdomen;Front perineal area;Buttocks;Right upper leg;Left upper leg;Right lower leg (including foot);Left lower leg (including foot) Bathing: 6: More than reasonable amount of time  FIM - Upper Body Dressing/Undressing Upper body dressing/undressing steps patient completed: Thread/unthread right bra strap;Thread/unthread left bra strap;Thread/unthread right sleeve of pullover shirt/dresss;Thread/unthread left sleeve of pullover shirt/dress;Pull shirt over trunk;Put head through opening of pull over shirt/dress Upper body dressing/undressing: 4: Min-Patient completed 75 plus % of tasks FIM - Lower Body Dressing/Undressing Lower body dressing/undressing steps patient completed: Thread/unthread right underwear leg;Thread/unthread left underwear leg;Pull underwear up/down;Thread/unthread right pants leg;Thread/unthread left pants leg;Pull pants up/down;Don/Doff left sock;Don/Doff right shoe;Don/Doff left shoe;Fasten/unfasten right shoe Lower body dressing/undressing: 5: Supervision: Safety issues/verbal cues  FIM - Toileting Toileting steps completed by patient: Adjust clothing prior to toileting;Performs perineal hygiene;Adjust clothing after toileting Toileting Assistive Devices: Grab bar or rail for support Toileting: 5: Supervision: Safety issues/verbal cues  FIM - Diplomatic Services operational officer Devices: Art gallery manager Transfers: 5-To toilet/BSC: Supervision (verbal cues/safety issues);5-From toilet/BSC: Supervision (verbal cues/safety issues)  FIM - Bed/Chair Transfer Bed/Chair Transfer Assistive Devices: Therapist, occupational: 5: Bed > Chair or W/C: Supervision (verbal cues/safety issues);5: Chair or W/C > Bed: Supervision (verbal cues/safety issues)  FIM - Locomotion:  Wheelchair Distance: 80 Locomotion: Wheelchair: 2: Travels 50 - 149 ft with supervision, cueing or coaxing FIM - Locomotion: Ambulation Locomotion: Ambulation Assistive Devices: Cane - Straight;Walker - Rolling Ambulation/Gait Assistance: 4: Min guard Locomotion: Ambulation: 4: Travels 150 ft or more with minimal assistance (Pt.>75%)  Comprehension Comprehension Mode: Auditory Comprehension: 5-Understands basic 90% of the time/requires cueing < 10% of the time  Expression Expression Mode: Verbal Expression: 5-Expresses basic 90% of the time/requires cueing < 10% of the time.  Social Interaction Social Interaction: 5-Interacts appropriately 90% of the time - Needs monitoring or encouragement for participation or interaction.  Problem Solving Problem Solving: 5-Solves basic 90% of the time/requires cueing < 10% of the time  Memory Memory: 5-Recognizes or recalls 90% of the time/requires cueing < 10%  of the time  Medical Problem List and Plan:  1. DVT Prophylaxis/Anticoagulation: Pharmaceutical: Lovenox  2. Pain Management: started on neurontin for left shoulder pain. Add ice prn. Will monitor.  3. Mood: continue celexa and effexor daily. LCSW to follow for formal evaluation.  4. Neuropsych: This patient is capable of making decisions on his/her own behalf.  5. CAD: On plavix and lopressor. Lipitor added at this admission.  6. DM type 2: monitor with ac/hs CBG checks. Resume metformin bid 7. HTN; monitor with bid checks. Continue cozaar and lopressor, reduce effexor and monitor 8.  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

## 2012-08-05 NOTE — Progress Notes (Signed)
Occupational Therapy Session Note  Patient Details  Name: [name] MRN: [id number] Date of Birth: 07-Jun-1951  Today's Date: 08/05/2012 Time: 0802-0900 and 4098-1191 Time Calculation (min): 58 min and 30 min  Short Term Goals: Week 1:  OT Short Term Goal 1 (Week 1): Pt will ambulate to toilet with min assist with RW. OT Short Term Goal 1 - Progress (Week 1): Met OT Short Term Goal 2 (Week 1): Pt will don shirt with set up. OT Short Term Goal 2 - Progress (Week 1): Met OT Short Term Goal 3 (Week 1): Pt will don pants with min assist. OT Short Term Goal 3 - Progress (Week 1): Met OT Short Term Goal 4 (Week 1): Pt will stand at sink to brush teeth with supervision. OT Short Term Goal 4 - Progress (Week 1): Met OT Short Term Goal 5 (Week 1): Pt will transfer to shower chair with min assist. OT Short Term Goal 5 - Progress (Week 1): Met Week 2:  OT Short Term Goal 1 (Week 2): Pt will ambulate to toilet with RW with supervision. OT Short Term Goal 2 (Week 2): Pt will don underwear and pants with supervision. OT Short Term Goal 3 (Week 2): Pt will  bathe with supervision. OT Short Term Goal 4 (Week 2): Pt will use LUE as a gross assist in bimanual tasks.  Skilled Therapeutic Interventions/Progress Updates:    Visit 1:   Pt seen for BADL retraining of toileting, bathing, and dressing with a focus on LUE functional use, dynamic standing balance, functional mobility, and safety awareness.  Pt used LUE as an active assist with retrieving clothing from drawers, pulling up pants, and opening containers. She was able to reach to floor level from standing to pick up item off floor with supervision.  Pt needed min cues to push up from chair versus reaching for the walker and to grasp tongue of shoe when donning shoes. Discussed need for her to have 24 hour supervision at home due to memory deficits and increased risk of falls.  Overall, pt required supervision with all tasks.   Visit 2:  Pt  worked on HEP of dowel exercises to increase LUE AROM. Pt provided with a handout.  Pt worked on shoulder flexion, elbow flexion, and grasp.  Discussed recommendations for HHOT and 24 hours supervision.     Therapy Documentation Precautions:  Precautions Precautions: Fall Precaution Comments: left weakness UE>LE Restrictions Weight Bearing Restrictions: No   Pain: Pain Assessment Pain Assessment: No/denies pain ADL:  See FIM for current functional status  Therapy/Group: Individual Therapy  Icie Kuznicki 08/05/2012, 9:31 AM

## 2012-08-05 NOTE — Progress Notes (Signed)
Physical Therapy Discharge Summary  Patient Details  Name: [name] MRN: [id number] Date of Birth: Mar 19, 1951  Today's Date: 08/05/2012 Time: 0905-1005 Time Calculation (min): 60  Patient has met 8 of 10 long term goals due to improved activity tolerance, improved balance, improved postural control, increased strength, ability to compensate for deficits, functional use of  left upper extremity and left lower extremity, improved attention and improved awareness.  Patient to discharge at an ambulatory level Supervision.  Pt is safer without w/c in home due to unsafe use; however, family requested rental w/c for use in community. Patient's care partner unavailable when working to provide the necessary cognitive assistance at discharge.  Neuropsych testing while at CIR determined that pt has markedly impaired memory due to dementia , probably due to CVAs.   ST memory deficits impaired pt's ability to remember safe use of RW during sit>< stand, and in distracting environments.  Pt also has a hx of depression, spending large amounts of each day in bed, prior to recent CVA.  PT strongly recommended 24 hour supervision, but husband is not able to provide this.  Family is considering pt moving to her mother's in [place], or having the adult son in [place] spend part of each day with her when husband is working.    Reasons goals not met: cognition/safety concerns  Recommendation:  Patient will benefit from ongoing skilled PT services in home health setting to continue to advance safe functional mobility, address ongoing impairments in balance, L awareness, L coordination, safety, and minimize fall risk.  Equipment: RW; rental w/c 20 x 16, and basic cushion to be delivered to pt's home  Reasons for discharge: discharge from hospital  Patient/family agrees with progress made and goals achieved: Yes  PT Discharge Precautions/Restrictions-  high risk for falls  Pain Pain  Assessment Pain Assessment: No/denies pain Vision/Perception     Cognition Overall Cognitive Status: Impaired Decreased Short Term Memory: Verbal basic;Functional basic Sensation Sensation Light Touch: Appears Intact Proprioception: Appears Intact Coordination Heel Shin Test: decreased excursion, speed, and accuracy bilaterally Motor  Motor Motor: Hemiplegia;Abnormal postural alignment and control Motor - Discharge Observations: left trunk shortening  Mobility- bed mobility modified independent, with atypical pattern supine> sit: moving bil LEs off bed first, then pushing up onto RUE to raise trunk.  Supervision transfers with or without RW.    Floor transfer on 08/05/12 with mod assist.  Pt provided with hand-out for sequence of steps for fall recovery.  Locomotion  Gait 200' with RW, supervision.  Gait includes narrow BOS, L foot shuffle,decreased L knee and hip flexion, and ankle DF, step through pattern. Up/down 12 steps (flight in stair well) with L rail ascending, supervision, VCs for sequencing.  Up/down ramp and curb with RW with supervision, RW.      Trunk/Postural Assessment  Cervical Assessment Cervical Assessment: Within Functional Limits Thoracic Assessment Thoracic Assessment: Within Functional Limits Lumbar Assessment Lumbar Assessment: Within Functional Limits Postural Control Trunk Control: In dynamic standing pt has left trunk shortening  Balance Static Sitting Balance Static Sitting - Level of Assistance: 7: Independent Dynamic Sitting Balance Dynamic Sitting - Level of Assistance: 7: Independent Static Standing Balance Static Standing - Level of Assistance: 5: Stand by assistance Dynamic Standing Balance Dynamic Standing - Level of Assistance: 5: Stand by assistance  Berg Balance Test : Sit to Stand Able to stand without using hands and stabilize independently  -Standing Unsupported Able to stand 2 minutes with supervision  -Sitting with Back  Unsupported  but Feet Supported on Floor or Stool Able to sit safely and securely 2 minutes  -Stand to Sit Sits safely with minimal use of hands  -Transfers Able to transfer safely, minor use of hands  -Standing Unsupported with Eyes Closed Able to stand 10 seconds safely  -Standing Ubsupported with Feet Together Needs help to attain position and unable to hold for 15 seconds  -From Standing, Reach Forward with Outstretched Arm Reaches forward but needs supervision From Standing Position,  -Pick up Object from Floor Able to pick up shoe, needs supervision  -From Standing Position, Turn to Look Behind Over each Shoulder Looks behind from both sides and weight shifts well  -Turn 360 Degrees Needs close supervision or verbal cueing  -Standing Unsupported, Alternately Place Feet on Step/Stool Needs assistance to keep from falling or unable to try  -Standing Unsupported, One Foot in [employer] balance while stepping or standing  -Standing on One Leg Tries to lift leg/unable to hold 3 seconds but remains standing independently   Total Score 33 (calculated)  Extremity Assessment      RLE Assessment RLE Assessment: Within Functional Limits LLE Assessment LLE Assessment: Exceptions to [facility] LLE Strength LLE Overall Strength Comments: grossly 4+/5; impaired coordination  Treatment today: gait up/down flight of stairs as above; gait x 200' x 2 with RW, without cues on straight path; with cues in home environment due to pt forgetting to use L hand on RW when distracted by a task of retrieving clothing from bureau.   neuromuscular re-education trunk and pelvis in sitting for shortening/lengthening for balance reactions.  Berg balance test= 33, minimal improvement from 30 in 1 week; denoted nearly 100% risk for falls.  See FIM for current functional status    Velina Drollinger 08/05/2012, 3:37 PM

## 2012-08-05 NOTE — Progress Notes (Signed)
Speech Language Pathology Daily Session Note  Patient Details  Name: [name] MRN: [id number] Date of Birth: May 13, 1951  Today's Date: 08/05/2012 Time: 1000-1045 Time Calculation (min): 45 min  Short Term Goals: Week 2: SLP Short Term Goal 1 (Week 2): Patient will solve complex problems with supervision level verbal cues  SLP Short Term Goal 2 (Week 2): Patient will recall steps of a transfer sequence as taught by PT/OT with supervision verbal cues  SLP Short Term Goal 3 (Week 2): Patient will recall new medication name and functions with supervision vebral cues to utilize external aids   Skilled Therapeutic Interventions: Treatment session focused on addressing medication management, patient education and SLP recommendations for a safe discharge.  SLP facilitated session with a more concrete form to track current medications, function and frequency.  RN present for part of session to administer medications and patient had difficulty recalling if she had taken medications already today and RN had to reinforce that the last time she took medications was last night.  SLP utilized that opportunity to reinforce need for 24/7 supervision to assist with recall daily information and ensure safety upon discharge. Patient verbalized understanding and SLP left a note for husband since family education has not been completed.  SLP recommends a brief follow up session to complete family education prior to discharge tomorrow.   FIM:  Comprehension Comprehension Mode: Auditory Comprehension: 5-Understands complex 90% of the time/Cues < 10% of the time Expression Expression Mode: Verbal Expression: 6-Expresses complex ideas: With extra time/assistive device Social Interaction Social Interaction: 6-Interacts appropriately with others with medication or extra time (anti-anxiety, antidepressant). Problem Solving Problem Solving: 5-Solves basic 90% of the time/requires cueing < 10% of the  time Memory Memory: 5-Recognizes or recalls 90% of the time/requires cueing < 10% of the time FIM - Eating Eating Activity: 5: Set-up assist for open containers  Pain Pain Assessment Pain Assessment: No/denies pain  Therapy/Group: Individual Therapy  Charlane Ferretti., CCC-SLP [phone]  [name] 08/05/2012, 12:17 PM

## 2012-08-05 NOTE — Progress Notes (Signed)
PT session 08/05/12  0900-0955 55 min  No pain reported.  Treatment focused on gait training and therapeutic activities.  Gait x 200' , x 2 with RW, no hand orthosis, supervision during divided task of conversation while ambulating.    Gait up/down 5 steps with 1 rail x 3 (15) using R rail ascending, supervision with VCs for sequencing.  Pt is inconsistent with remembering correct step-to sequence when descending.  Gait up/down curb and ramp with RW with supervision, VCS for safe use of RW.  Sit>< stand repeatedly during gait training above, with supervision. VCs needed for hand placement, etc 1/4 trials.  Pt remembered to push up with one hand and reach back with one or two hands; she is safer today.  Hand orthosis has been discontinued, which also improves pt safety with RW.  Gait velocity = 1.4'/second, = decreased functional efficiency, and risk for recurrent falls.  Discussed pt's previous falls, need for supervision to remind her of safety issues.  Pt reported that the bed she would use at home.  Issued pt hand-out of floor transfer for fall recovery.  Floor transfer with use of 10" high stool as intermediate surface between floor and 20' high mat.  Pt was able to move supine> forearms and knees, but required mod assist  to get to hands and knees, then place both hands on stool, then mat, then to sitting on mat.  Pt was amazed at her difficulties with floor transfer, and stated she wanted to talk to the SW about d/cing to her mother's where someone would be home with her.  Therapist discussed with SW; she will look into this further.

## 2012-08-06 ENCOUNTER — Inpatient Hospital Stay (HOSPITAL_COMMUNITY): Payer: BC Managed Care – PPO

## 2012-08-06 ENCOUNTER — Encounter (HOSPITAL_COMMUNITY): Payer: BC Managed Care – PPO | Admitting: Occupational Therapy

## 2012-08-06 ENCOUNTER — Inpatient Hospital Stay (HOSPITAL_COMMUNITY): Payer: BC Managed Care – PPO | Admitting: Speech Pathology

## 2012-08-06 MED ORDER — POLYETHYLENE GLYCOL 3350 17 G PO PACK: 17.0000 g | PACK | Freq: Every day | ORAL | Status: DC

## 2012-08-06 MED ORDER — HYDROCHLOROTHIAZIDE 25 MG PO TABS: 25.0000 mg | ORAL_TABLET | Freq: Every day | ORAL | Status: DC

## 2012-08-06 MED ORDER — METFORMIN HCL 500 MG PO TABS: 500.0000 mg | ORAL_TABLET | Freq: Two times a day (BID) | ORAL | Status: DC

## 2012-08-06 MED ORDER — OXYBUTYNIN CHLORIDE ER 10 MG PO TB24: 10.0000 mg | ORAL_TABLET | Freq: Every day | ORAL | Status: DC

## 2012-08-06 NOTE — Progress Notes (Signed)
Social Work Discharge Note Discharge Note  The overall goal for the admission was met for:   Discharge location: Yes-HOME WITH HUSBAND WHO PLANS TO TAKE TIME OFF, THEN SEE IF NEEDS TO GO TO MOM'S HOME IN [place]  Length of Stay: Yes-15 DAYS  Discharge activity level: Yes-SUPERVISION LEVEL FOR SAFETY  Home/community participation: Yes  Services provided included: MD, RD, PT, OT, SLP, RN, TR, Pharmacy, Neuropsych and SW  Financial Services: Medicare and Private Insurance: [employer] [place] & [place]  Follow-up services arranged: Home Health: [facility]-PT, OT, RN , DME: [facility]-ROLLING WALKER, TUB SEAT and Patient/Family has no preference for HH/DME agencies ALSO WANTS WHEELCHAIR FOR COMMUNITY-DELIVER TO HOME  Comments (or additional information):FAMILY EDUCATION-PT REALIZES NOW SHE NEEDS SOMEONE WITH HER AT HOME.  GOING TO MOM'S HOME EVENTUALLY PT AND HUSBAND NEED TO BE REALISTIC REGARDING PT'S CARE NEEDS. HUSBAND LACKS INSIGHT INTO PT'S DEFICITS.  Patient/Family verbalized understanding of follow-up arrangements: Yes  Individual responsible for coordination of the follow-up plan: SELF & THELDELROE-HUSBAND  Confirmed correct DME delivered: Lucy Chris 08/06/2012    Lucy Chris

## 2012-08-06 NOTE — Progress Notes (Signed)
Speech Language Pathology Session Note & Discharge Summary  Patient Details  Name: [name] MRN: [id number] Date of Birth: November 18, 1950  Today's Date: 08/06/2012 Time: 1000-1030 Time Calculation (min): 30 min  Skilled Therapeutic Intervention: Treatment focus on family education with pt and her husband in regards to current cognitive deficits and strategies to utilize at home. Pt and husband verbalize understanding, however, pt also reports he can provide 24/7 supervision for 1 week but will need to assistance after that length of time. Pt agreeable to go to her mother's house. Social Work notified and will f/u.   Patient has met 3 of 3 long term goals.  Patient to discharge at overall Supervision level.   Reasons goals not met: N/A   Clinical Impression/Discharge Summary: Pt has made functional gains and has met 3 out of 3 LTG's this admission. Currently, pt is overall supervision for working memory, problem solving and safety awareness. Pt/family education complete, however pt's care partner unavailable when working to provide the necessary cognitive assistance at discharge. Therapy team is recommending 24 hour supervision, but husband is not able to provide this. Family is considering pt moving to her mother's in [place], or having the adult son in [place] spend part of each day with her when husband is working. Pt would benefit from f/u home health SLP services to maximize cognitive function and overall independence.   Care Partner:  Caregiver Able to Provide Assistance: Yes  Type of Caregiver Assistance: Physical;Cognitive  Recommendation:  Home Health SLP  Rationale for SLP Follow Up: Maximize cognitive function and independence   Equipment: N/A   Reasons for discharge: Treatment goals met;Discharged from hospital   Patient/Family Agrees with Progress Made and Goals Achieved: Yes   See FIM for current functional status  Izora Benn 08/06/2012, 4:20 PM

## 2012-08-06 NOTE — Progress Notes (Signed)
Social Work Patient ID: [name], female   DOB: 03/22/1951, 61 y.o.   MRN: [id number] Pt requires a lightweight wheelchair to self propel and uses for self care activities.  She is unable to self propel in a standard weight wheelchair.

## 2012-08-06 NOTE — Progress Notes (Signed)
Social Work Patient ID: [name], female   DOB: [birth year], 61 y.o.   MRN: [id number] Met with pt this am and she reports she will go home with husband this weekend and then he will take her to [place] to be with her Mom. She realizes now someone needs to be there with her for her safety and in case she falls.  Will get Mom's address and arrange follow up With a Home Health agency for follow up therapies.

## 2012-08-06 NOTE — Progress Notes (Signed)
Patient discharge to home with husband at 55.  Discharge instruction provided with explanation by Marissa Nestle, PA.  Patient verbalize understanding, no further questions asked.  Patient escorted off the unit by inpatient rehab NT.

## 2012-08-06 NOTE — Progress Notes (Signed)
Patient ID: [name], female   DOB: 04/08/1951, 61 y.o.   MRN: [id number]  Subjective/Complaints: Still deciding whether to go home with husband and have son help supervise Discussed Team rec for 24/7 Sup No driving Pt willing to go to Mom[facility] in [place], has more support there Review of Systems  HENT:       Dry mouth  Genitourinary: Positive for urgency and frequency. Negative for dysuria.  Neurological: Positive for focal weakness and weakness.    Objective: Vital Signs: Blood pressure 162/95, pulse 54, temperature 98.3 F (36.8 C), temperature source Oral, resp. rate 22, weight 91.2 kg (201 lb 1 oz), SpO2 94.00%. No results found. Results for orders placed during the hospital encounter of 07/22/12 (from the past 72 hour(s))  GLUCOSE, CAPILLARY     Status: Abnormal   Collection Time   08/03/12 11:31 AM      Component Value Range Comment   Glucose-Capillary 114 (*) 70 - 99 mg/dL   GLUCOSE, CAPILLARY     Status: Abnormal   Collection Time   08/03/12  4:41 PM      Component Value Range Comment   Glucose-Capillary 117 (*) 70 - 99 mg/dL   GLUCOSE, CAPILLARY     Status: Abnormal   Collection Time   08/03/12  8:43 PM      Component Value Range Comment   Glucose-Capillary 131 (*) 70 - 99 mg/dL    Comment 1 Notify RN     GLUCOSE, CAPILLARY     Status: Abnormal   Collection Time   08/04/12  7:30 AM      Component Value Range Comment   Glucose-Capillary 110 (*) 70 - 99 mg/dL   GLUCOSE, CAPILLARY     Status: Abnormal   Collection Time   08/04/12 11:51 AM      Component Value Range Comment   Glucose-Capillary 130 (*) 70 - 99 mg/dL   GLUCOSE, CAPILLARY     Status: Normal   Collection Time   08/04/12  4:30 PM      Component Value Range Comment   Glucose-Capillary 81  70 - 99 mg/dL    Comment 1 Notify RN     GLUCOSE, CAPILLARY     Status: Abnormal   Collection Time   08/04/12  8:50 PM      Component Value Range Comment   Glucose-Capillary 131 (*) 70 - 99 mg/dL   GLUCOSE,  CAPILLARY     Status: Abnormal   Collection Time   08/05/12  7:12 AM      Component Value Range Comment   Glucose-Capillary 114 (*) 70 - 99 mg/dL    Comment 1 Notify RN     GLUCOSE, CAPILLARY     Status: Abnormal   Collection Time   08/05/12 11:25 AM      Component Value Range Comment   Glucose-Capillary 195 (*) 70 - 99 mg/dL    Comment 1 Notify RN     GLUCOSE, CAPILLARY     Status: Normal   Collection Time   08/05/12  4:46 PM      Component Value Range Comment   Glucose-Capillary 80  70 - 99 mg/dL   GLUCOSE, CAPILLARY     Status: Abnormal   Collection Time   08/05/12  8:59 PM      Component Value Range Comment   Glucose-Capillary 118 (*) 70 - 99 mg/dL    Comment 1 Notify RN     GLUCOSE, CAPILLARY     Status:  Abnormal   Collection Time   08/06/12  7:23 AM      Component Value Range Comment   Glucose-Capillary 125 (*) 70 - 99 mg/dL      HEENT: normal Cardio: RRR Resp: CTA B/L GI: BS positive Extremity:  No Edema Skin:   Intact Neuro: Alert/Oriented, Cranial Nerve II-XII normal, Normal Sensory, Abnormal Motor R side 5/5, L side 1/5 distal, 2-/5 prox UE, 3/5 LLE and Abnormal FMC Ataxic/ dec FMC Musc/Skel:  Normal visula field intact to confrontation testing, no extinction  Assessment/Plan: 1. Functional deficits secondary to R MCA infarct with LUE>> LLE weakness medically stable for D/C from hospital today PMR f/u PCP f/u Neuro f/u FIM: FIM - Bathing Bathing Steps Patient Completed: Chest;Right Arm;Left Arm;Abdomen;Front perineal area;Buttocks;Right lower leg (including foot);Left upper leg;Right upper leg;Left lower leg (including foot) Bathing: 6: More than reasonable amount of time  FIM - Upper Body Dressing/Undressing Upper body dressing/undressing steps patient completed: Thread/unthread left sleeve of pullover shirt/dress;Put head through opening of pull over shirt/dress;Thread/unthread right sleeve of pullover shirt/dresss;Pull shirt over trunk Upper body  dressing/undressing: 5: Supervision: Safety issues/verbal cues FIM - Lower Body Dressing/Undressing Lower body dressing/undressing steps patient completed: Thread/unthread right underwear leg;Thread/unthread left underwear leg;Pull underwear up/down;Thread/unthread right pants leg;Pull pants up/down;Don/Doff left sock;Don/Doff right shoe;Don/Doff left shoe;Don/Doff right sock;Thread/unthread left pants leg (elastic shoe laces) Lower body dressing/undressing: 5: Supervision: Safety issues/verbal cues  FIM - Toileting Toileting steps completed by patient: Adjust clothing prior to toileting;Adjust clothing after toileting;Performs perineal hygiene Toileting Assistive Devices: Grab bar or rail for support Toileting: 5: Supervision: Safety issues/verbal cues  FIM - Diplomatic Services operational officer Devices: Art gallery manager Transfers: 5-To toilet/BSC: Supervision (verbal cues/safety issues);5-From toilet/BSC: Supervision (verbal cues/safety issues)  FIM - Banker Devices: Therapist, occupational: 6: Supine > Sit: No assist;6: Sit > Supine: No assist;5: Bed > Chair or W/C: Supervision (verbal cues/safety issues);5: Chair or W/C > Bed: Supervision (verbal cues/safety issues)  FIM - Locomotion: Wheelchair Distance: 80 Locomotion: Wheelchair: 0: Activity did not occur FIM - Locomotion: Ambulation Locomotion: Ambulation Assistive Devices: Designer, industrial/product Ambulation/Gait Assistance: 5: Supervision Locomotion: Ambulation: 5: Travels 150 ft or more with supervision/safety issues  Comprehension Comprehension Mode: Auditory Comprehension: 5-Understands complex 90% of the time/Cues < 10% of the time  Expression Expression Mode: Verbal Expression: 5-Expresses complex 90% of the time/cues < 10% of the time  Social Interaction Social Interaction: 6-Interacts appropriately with others with medication or extra time (anti-anxiety,  antidepressant).  Problem Solving Problem Solving: 5-Solves basic 90% of the time/requires cueing < 10% of the time  Memory Memory: 5-Recognizes or recalls 90% of the time/requires cueing < 10% of the time  Medical Problem List and Plan:  1. DVT Prophylaxis/Anticoagulation: Pharmaceutical: D/C Lovenox  2. Pain Management: started on neurontin for left shoulder pain. Add ice prn. Will monitor.  3. Mood: continue celexa and effexor daily. LCSW to follow for formal evaluation.  4. Neuropsych: This patient is capable of making decisions on his/her own behalf.  5. CAD: On plavix and lopressor. Lipitor added at this admission.  6. DM type 2: monitor with ac/hs CBG checks. Resume metformin bid 7. HTN; monitor with bid checks. Continue cozaar and lopressor, reduce effexor and monitor 8.  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

## 2012-08-06 NOTE — Progress Notes (Signed)
Social Work Patient ID: [name], female   DOB: [birth year], 61 y.o.   MRN: [id number] Now husband is taking time off and then will see if still needs to go to Mom's home.  Both lack insight into pt's deficits.  Discussed again with both options and gave written information.  Husband to call worker if problems once home or questions That arise.

## 2012-08-24 ENCOUNTER — Inpatient Hospital Stay: Payer: BC Managed Care – PPO | Admitting: Physical Medicine & Rehabilitation

## 2012-08-24 NOTE — Discharge Summary (Signed)
[name], [name].:  [id number]  MEDICAL RECORD Gray.:  [id number]  LOCATION:  4036                         FACILITY:  [facility]  PHYSICIAN:  Erick Colace, M.D.DATE OF BIRTH:  1951-09-25  DATE OF ADMISSION:  07/22/2012 DATE OF DISCHARGE:  08/06/2012                              DISCHARGE SUMMARY   DISCHARGE DIAGNOSES: 1. Right middle cerebral artery infarct. 2. Diabetes mellitus, type 2. 3. Hyperactive bladder, neurogenic. 4. Coronary artery disease.  HISTORY OF PRESENT ILLNESS:  Ms. [name] is a 61 year old female originally admitted on July 19, 2012, with left-sided weakness and numbness.  MRI of brain done showed right frontal lobe infarct.  A 2D echo revealed EF of 45% to 50% with diffuse hypokinesis. Carotid Dopplers done showed Gray ICA stenosis.  Dr. Pearlean Brownie was consulted and recommended continuing Plavix for thrombotic stroke due to small vessel changes.  The patient continued to have problems with left hemiparesis as well as unsteady gait.  Therapy team has been following and recommended CIR for progression.  PAST MEDICAL HISTORY:  Hypertension, DM type 2, depression, sleep apnea, renal cancer with history of unilateral nephrectomy, and history of MI.  FUNCTIONAL HISTORY:  The patient was independent, but very sedentary prior to admission.  She had problems with declining memory per family input.  FUNCTIONAL STATUS:  The patient required min-to-mod assist for bed mobility, min assist for transfers, min assist for ambulating 15 feet with handheld assist.  RECENT LABORATORY DATA:  Check of lytes from September 27, revealed sodium 137, potassium 4.3, chloride 102, CO2 of 27, BUN 18, creatinine 0.97, glucose 127.  CBC done revealed hemoglobin 12.4, hematocrit 37.2, white count 11.1, platelets 332.  HOSPITAL COURSE:  Ms. [name] was admitted to Rehab on July 22, 2012, for inpatient therapies to consist of PT, OT,  and speech therapy at least 3 hours 5 days a week.  Past admission, physiatrist, rehab, RN, and therapy team have worked together to provide customized collaborative interdisciplinary care.  Rehab RN has worked with the patient on bowel and bladder program as well as safety.  The patient's blood sugars were checked on a.c. and nightly basis, and these were noted to be ranging from 80s to 130s range overall.  The patient was noted to have  hyperactive bladder and was started on Ditropan to help with her symptoms.   During the patient's stay in rehab, team conference was held to monitor the patient's progress, set goals, as well as discuss barriers to discharge.  Physical Therapy has worked with the patient on balance, mobility, and strengthening.  The patient progressed and was able to ambulate 200  feet x2 with use of rolling walker and supervision.  She was able to navigate 5 steps with 1 rail with supervision.  OT has worked with the patient on activity tolerance, postural control as well as functional use of left upper extremity.  She was showing improvement in attention, awareness, as well as coordination.  She was at supervision level for ADL tasks.  Speech Therapy has worked with the patient on working memory problem, solving as well as Engineer, building services.  She requires supervision for med management.  Concerns regarding safety, especially  with the patient's history of falls prior to admission were addressed with husband.   24- hour supervision is recommended for now.  Further followup home health therapies to continue past discharge.  On August 06, 2012, the patient is discharged to home in improved condition.  DISCHARGE MEDICATIONS: 1. Lipitor 20 mg p.o. q.p.m. 2. Celexa 20 mg p.o. at bedtime. 3. Plavix 75 mg p.o. q.p.m. 4. Neurontin 100 mg p.o. t.i.d. 5. Hydrochlorothiazide 25 mg p.o. per day. 6. Cozaar 100 mg p.o. per day. 7. Metformin 500 mg p.o. b.i.d. 8. Lopressor 50 mg  p.o. b.i.d. 9. Ditropan XL 10 mg p.o. at bedtime. 10.MiraLAX 17 g in 8 ounces p.o. per day. 11.Ultram 50 mg p.o. b.i.d. p.r.n. pain. 12.Effexor XR 150 mg p.o. at bedtime.  DIET:  Carb modified medium.  ACTIVITY LEVEL:  24-hour supervision.  Gray driving until follow up with Dr. Wynn Banker.  SPECIAL INSTRUCTIONS:  [facility] to provide PT, OT, RN and Child psychotherapist.  Check CBGs b.i.d. to t.i.d. basis for now.  FOLLOWUP:  The patient to follow up with Dr. Fleet Contras on August 13, 2012, at 2:15 p.m.  Follow up with Dr. Claudette Laws on August 24, 2012, at 10 a.m. for 10:30 appointment.  Follow up with Dr. Delia Heady in 4-6 weeks.     Delle Reining, P.A.   ______________________________ Erick Colace, M.D.    PL/MEDQ  D:  08/24/2012  T:  08/24/2012  Job:  [id number]  cc:   Fleet Contras, M.D. Pramod P. Pearlean Brownie, MD

## 2012-09-02 ENCOUNTER — Inpatient Hospital Stay: Payer: BC Managed Care – PPO | Admitting: Physical Medicine & Rehabilitation

## 2012-09-03 ENCOUNTER — Telehealth: Payer: Self-pay

## 2012-09-03 NOTE — Telephone Encounter (Signed)
Looks like this patient did not come in for her 08/24/12 hospital follow up, but is coming in on 09/14/12. Since you have not seen in office I did not know if I should make the referral to OP PT/OT without checking with you first. Irving Burton from [facility] has called about getting her to OP.

## 2012-09-03 NOTE — Telephone Encounter (Signed)
[name] called to get patient set up for out patient physical therapy and occupational therapy.

## 2012-09-03 NOTE — Telephone Encounter (Signed)
Will need to see me before I order

## 2012-09-06 ENCOUNTER — Inpatient Hospital Stay: Payer: BC Managed Care – PPO | Admitting: Physical Medicine & Rehabilitation

## 2012-09-06 NOTE — Telephone Encounter (Signed)
Lm advising [name] patient needs appt before orders can be given.  Patient informed and scheduled appt.

## 2012-09-13 ENCOUNTER — Telehealth: Payer: Self-pay | Admitting: *Deleted

## 2012-09-13 NOTE — Telephone Encounter (Signed)
[name] with [facility] is calling about outstanding Face to Face paperwork. She is going to refax forms for Dr. Wynn Banker to fill out.

## 2012-09-14 ENCOUNTER — Ambulatory Visit (HOSPITAL_BASED_OUTPATIENT_CLINIC_OR_DEPARTMENT_OTHER): Payer: BC Managed Care – PPO | Admitting: Physical Medicine & Rehabilitation

## 2012-09-14 ENCOUNTER — Encounter: Payer: BC Managed Care – PPO | Attending: Physical Medicine & Rehabilitation

## 2012-09-14 ENCOUNTER — Encounter: Payer: Self-pay | Admitting: Physical Medicine & Rehabilitation

## 2012-09-14 VITALS — BP 133/71 | HR 70 | Resp 14 | Ht 61.0 in | Wt 196.6 lb

## 2012-09-14 DIAGNOSIS — R269 Unspecified abnormalities of gait and mobility: Secondary | ICD-10-CM | POA: Insufficient documentation

## 2012-09-14 DIAGNOSIS — R29898 Other symptoms and signs involving the musculoskeletal system: Secondary | ICD-10-CM | POA: Insufficient documentation

## 2012-09-14 DIAGNOSIS — M6281 Muscle weakness (generalized): Secondary | ICD-10-CM

## 2012-09-14 DIAGNOSIS — I69998 Other sequelae following unspecified cerebrovascular disease: Secondary | ICD-10-CM | POA: Insufficient documentation

## 2012-09-14 DIAGNOSIS — I6350 Cerebral infarction due to unspecified occlusion or stenosis of unspecified cerebral artery: Secondary | ICD-10-CM

## 2012-09-14 DIAGNOSIS — I639 Cerebral infarction, unspecified: Secondary | ICD-10-CM

## 2012-09-14 DIAGNOSIS — N319 Neuromuscular dysfunction of bladder, unspecified: Secondary | ICD-10-CM | POA: Insufficient documentation

## 2012-09-14 DIAGNOSIS — R531 Weakness: Secondary | ICD-10-CM

## 2012-09-14 NOTE — Progress Notes (Signed)
Subjective:    Patient ID: [name], female    DOB: [birth year], 61 y.o.   MRN: [id number]  HPI Ms. [name] is a 61 year old  female originally admitted on July 19, 2012, with left-sided  weakness and numbness. MRI of brain done showed right frontal lobe  infarct. A 2D echo revealed EF of 45% to 50% with diffuse hypokinesis.  Carotid Dopplers done showed no ICA stenosis  Pain Inventory Average Pain 0 Pain Right Now 0 My pain is not in any pain  In the last 24 hours, has pain interfered with the following? General activity 0 Relation with others 0 Enjoyment of life 5 What TIME of day is your pain at its worst? morning Sleep (in general) Good  Pain is worse with: some activites Pain improves with: unsure Relief from Meds: 7  Mobility walk without assistance use a cane how many minutes can you walk? unsure ability to climb steps?  yes do you drive?  no needs help with transfers  Function not employed: date last employed 07/2003  Neuro/Psych bladder control problems  Prior Studies Any changes since last visit?  no  Physicians involved in your care Any changes since last visit?  no   Family History  Problem Relation Age of Onset  . Diabetes Father   . Alzheimer's disease Father   . Kidney cancer Father   . Hypertension Father   . Kidney disease Father     kidney removal  . Hypertension Mother   . Arthritis Mother   . Stroke Brother   . Diabetes Brother   . Hypertension Brother   . Diabetes Sister   . Thyroid disease Sister   . Hyperlipidemia Sister   . Other Sister     thyroidectomy   History   Social History  . Marital Status: Married    Spouse Name: N/A    Number of Children: N/A  . Years of Education: N/A   Social History Main Topics  . Smoking status: Never Smoker   . Smokeless tobacco: None  . Alcohol Use: No  . Drug Use: No  . Sexually Active: None   Other Topics Concern  . None   Social History Narrative  .  None   Past Surgical History  Procedure Date  . Spleen removal 2001  . Laparoscopic gastric banding 05/09/2008  . Abdominal hysterectomy 2002  . Cholecystectomy 1999  . Nephrectomy 2001    due to cancer   Past Medical History  Diagnosis Date  . Heart attack   . Stroke   . Hypertension   . Diabetes mellitus   . Depression   . Sleep apnea   . Hyperlipidemia   . Bronchitis   . SOB (shortness of breath)   . Arthritis   . Dizziness   . Weakness   . Chronic kidney disease     possible stones - confirm with patient  . Sore throat   . Sinus problem   . Family history of breast cancer   . Renal cancer    BP 133/71  Pulse 70  Resp 14  Ht 5\' 1"  (1.549 m)  Wt 196 lb 9.6 oz (89.177 kg)  BMI 37.15 kg/m2  SpO2 99%   Review of Systems  Musculoskeletal: Positive for gait problem.  All other systems reviewed and are negative.       Objective:   Physical Exam  Constitutional: She is oriented to person, place, and time. She appears well-developed and well-nourished.  HENT:  Head: Normocephalic and atraumatic.  Neurological: She is alert and oriented to person, place, and time. She displays no atrophy. A sensory deficit is present. She exhibits normal muscle tone. Coordination and gait abnormal.       4/5 strength in the left deltoid, biceps, triceps, grip, hip flexor, knee extensor ankle dorsiflexor 5/5 on the right side  Wide-based slow step length ambulation  Psychiatric: She has a normal mood and affect.          Assessment & Plan:  1. 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 I recommend continued outpatient PT and OT. We'll make in order. I do think it is safe for her to travel and go on a cruise in about 2-3 weeks with another companion 2. Neurogenic bladder we'll try stopping the oxybutynin for for 5 days if her urinary frequency return she'll need to resume taking it

## 2012-09-14 NOTE — Patient Instructions (Signed)
Stop oxybutynin for 4 or 5 days. If you start having bladder frequency or urgency or poor control restart

## 2012-10-12 ENCOUNTER — Other Ambulatory Visit (HOSPITAL_COMMUNITY): Payer: BC Managed Care – PPO

## 2012-10-12 ENCOUNTER — Emergency Department (INDEPENDENT_AMBULATORY_CARE_PROVIDER_SITE_OTHER)
Admission: EM | Admit: 2012-10-12 | Discharge: 2012-10-12 | Disposition: A | Discharge status: Home / Self Care | Payer: Medicare Other | Source: Home / Self Care | Attending: Family Medicine | Admitting: Family Medicine

## 2012-10-12 ENCOUNTER — Encounter (HOSPITAL_COMMUNITY): Payer: Self-pay | Admitting: Emergency Medicine

## 2012-10-12 ENCOUNTER — Emergency Department (INDEPENDENT_AMBULATORY_CARE_PROVIDER_SITE_OTHER): Payer: BC Managed Care – PPO

## 2012-10-12 DIAGNOSIS — S40012A Contusion of left shoulder, initial encounter: Secondary | ICD-10-CM

## 2012-10-12 DIAGNOSIS — S40019A Contusion of unspecified shoulder, initial encounter: Secondary | ICD-10-CM

## 2012-10-12 NOTE — ED Notes (Signed)
Pt c/o falling down steps over 3 weeks ago. Pt state that she is having pain on left side. Left shoulder pain. Pt hand another fall on Tuesday of last week getting out of bed.   Pt states recent hx of stroke on left side. Hard to lay on shoulder and back.

## 2012-10-12 NOTE — ED Provider Notes (Signed)
History     CSN: [id number]  Arrival date & time 10/12/12  1859   First MD Initiated Contact with Patient 10/12/12 1937      Chief Complaint  Patient presents with  . Fall    fell down steps after thanks giving cant remember exact day.    (Consider location/radiation/quality/duration/timing/severity/associated sxs/prior treatment) Patient is a 61 y.o. female presenting with fall. The history is provided by the patient.  Fall The accident occurred more than 1 week ago (fell going into sister's house down stairs with left sided injury.). The fall occurred while walking. She landed on concrete. There was no blood loss. The point of impact was the left shoulder. The pain is present in the left shoulder. The pain is mild. She was ambulatory at the scene. Pertinent negatives include no loss of consciousness. The symptoms are aggravated by use of the injured limb and rotation.    Past Medical History  Diagnosis Date  . Heart attack   . Stroke   . Hypertension   . Diabetes mellitus   . Depression   . Sleep apnea   . Hyperlipidemia   . Bronchitis   . SOB (shortness of breath)   . Arthritis   . Dizziness   . Weakness   . Chronic kidney disease     possible stones - confirm with patient  . Sore throat   . Sinus problem   . Family history of breast cancer   . Renal cancer     Past Surgical History  Procedure Date  . Spleen removal 2001  . Laparoscopic gastric banding 05/09/2008  . Abdominal hysterectomy 2002  . Cholecystectomy 1999  . Nephrectomy 2001    due to cancer    Family History  Problem Relation Age of Onset  . Diabetes Father   . Alzheimer's disease Father   . Kidney cancer Father   . Hypertension Father   . Kidney disease Father     kidney removal  . Hypertension Mother   . Arthritis Mother   . Stroke Brother   . Diabetes Brother   . Hypertension Brother   . Diabetes Sister   . Thyroid disease Sister   . Hyperlipidemia Sister   . Other Sister    thyroidectomy    History  Substance Use Topics  . Smoking status: Never Smoker   . Smokeless tobacco: Not on file  . Alcohol Use: No    OB History    Grav Para Term Preterm Abortions TAB SAB Ect Mult Living                  Review of Systems  Constitutional: Negative.   HENT: Negative for neck pain.   Cardiovascular: Negative for chest pain.  Musculoskeletal: Positive for back pain and arthralgias.  Skin: Positive for wound.       Abrasions to left side of arm and chest.  Neurological: Negative for loss of consciousness.    Allergies  Iohexol  Home Medications   Current Outpatient Rx  Name  Route  Sig  Dispense  Refill  . ATORVASTATIN CALCIUM 20 MG PO TABS   Oral   Take 1 tablet (20 mg total) by mouth daily at 6 PM.         . CITALOPRAM HYDROBROMIDE 20 MG PO TABS   Oral   Take 20 mg by mouth at bedtime.         . CLOPIDOGREL BISULFATE 75 MG PO TABS   Oral  Take 75 mg by mouth at bedtime.         Marland Kitchen GABAPENTIN 100 MG PO CAPS   Oral   Take 1 capsule (100 mg total) by mouth 3 (three) times daily.         Marland Kitchen HYDROCHLOROTHIAZIDE 25 MG PO TABS   Oral   Take 1 tablet (25 mg total) by mouth daily. For blood pressure.   30 tablet   1   . LOSARTAN POTASSIUM 100 MG PO TABS   Oral   Take 100 mg by mouth at bedtime.         Marland Kitchen METFORMIN HCL 500 MG PO TABS   Oral   Take 1 tablet (500 mg total) by mouth 2 (two) times daily with a meal.   60 tablet   1   . METOPROLOL TARTRATE 50 MG PO TABS   Oral   Take 50 mg by mouth 2 (two) times daily.         . OXYBUTYNIN CHLORIDE ER 10 MG PO TB24   Oral   Take 1 tablet (10 mg total) by mouth at bedtime. For hyperactive bladder   30 tablet   1   . POLYETHYLENE GLYCOL 3350 PO PACK   Oral   Take 17 g by mouth daily.   14 each      . TRAMADOL HCL 50 MG PO TABS   Oral   Take 1 tablet (50 mg total) by mouth 2 (two) times daily as needed for pain. For pain   10 tablet   0   . VENLAFAXINE HCL ER 150 MG PO  CP24   Oral   Take 150 mg by mouth at bedtime.           BP 140/76  Pulse 84  Temp 98.2 F (36.8 C) (Oral)  Resp 16  SpO2 100%  Physical Exam  Nursing note and vitals reviewed. Constitutional: She is oriented to person, place, and time. She appears well-developed and well-nourished. No distress.  Musculoskeletal: She exhibits tenderness.       Left shoulder: She exhibits decreased range of motion, tenderness, pain and decreased strength. She exhibits no bony tenderness, no swelling, no crepitus and normal pulse.       Arms: Neurological: She is alert and oriented to person, place, and time.  Skin: Skin is warm and dry.    ED Course  Procedures (including critical care time)  Labs Reviewed - No data to display Dg Shoulder Left  10/12/2012  *RADIOLOGY REPORT*  Clinical Data: Fall.  Left shoulder pain and limited range of motion.  LEFT SHOULDER - 2+ VIEW  Comparison: None.  Findings: No evidence of acute fracture or dislocation.  Multiple old left rib fracture deformities noted.  No other significant bone abnormality identified.  IMPRESSION:  1.  No acute findings. 2.  Multiple old left rib fracture deformities.   Original Report Authenticated By: Myles Rosenthal, M.D.      1. Contusion of shoulder, left       MDM  X-rays reviewed and report per radiologist.         Linna Hoff, MD 10/12/12 2031

## 2012-10-15 ENCOUNTER — Ambulatory Visit: Payer: BC Managed Care – PPO | Admitting: Physical Medicine & Rehabilitation

## 2012-10-15 ENCOUNTER — Encounter: Payer: Medicare Other | Attending: Physical Medicine & Rehabilitation

## 2012-10-15 DIAGNOSIS — I69998 Other sequelae following unspecified cerebrovascular disease: Secondary | ICD-10-CM | POA: Insufficient documentation

## 2012-10-15 DIAGNOSIS — N319 Neuromuscular dysfunction of bladder, unspecified: Secondary | ICD-10-CM | POA: Insufficient documentation

## 2012-10-15 DIAGNOSIS — R269 Unspecified abnormalities of gait and mobility: Secondary | ICD-10-CM | POA: Insufficient documentation

## 2012-10-15 DIAGNOSIS — R29898 Other symptoms and signs involving the musculoskeletal system: Secondary | ICD-10-CM | POA: Insufficient documentation

## 2012-10-21 ENCOUNTER — Other Ambulatory Visit: Payer: Self-pay

## 2012-10-26 ENCOUNTER — Encounter: Payer: Self-pay | Admitting: Physical Medicine and Rehabilitation

## 2012-10-26 ENCOUNTER — Encounter
Payer: BC Managed Care – PPO | Attending: Physical Medicine and Rehabilitation | Admitting: Physical Medicine and Rehabilitation

## 2012-10-26 ENCOUNTER — Telehealth: Payer: Self-pay | Admitting: Physical Medicine & Rehabilitation

## 2012-10-26 VITALS — BP 154/97 | HR 67 | Resp 14 | Ht 61.0 in | Wt 199.0 lb

## 2012-10-26 DIAGNOSIS — R29898 Other symptoms and signs involving the musculoskeletal system: Secondary | ICD-10-CM | POA: Insufficient documentation

## 2012-10-26 DIAGNOSIS — M6281 Muscle weakness (generalized): Secondary | ICD-10-CM

## 2012-10-26 DIAGNOSIS — R531 Weakness: Secondary | ICD-10-CM

## 2012-10-26 DIAGNOSIS — I639 Cerebral infarction, unspecified: Secondary | ICD-10-CM

## 2012-10-26 DIAGNOSIS — I69998 Other sequelae following unspecified cerebrovascular disease: Secondary | ICD-10-CM | POA: Insufficient documentation

## 2012-10-26 DIAGNOSIS — I69993 Ataxia following unspecified cerebrovascular disease: Secondary | ICD-10-CM | POA: Insufficient documentation

## 2012-10-26 DIAGNOSIS — I6350 Cerebral infarction due to unspecified occlusion or stenosis of unspecified cerebral artery: Secondary | ICD-10-CM

## 2012-10-26 NOTE — Progress Notes (Signed)
Subjective:    Patient ID: [name], female    DOB: October 25, 1951, 61 y.o.   MRN: [id number]  HPI  [name] is a 61 year old  female originally admitted on July 19, 2012, with left-sided  weakness and numbness. MRI of brain done showed right frontal lobe  infarct. A 2D echo revealed EF of 45% to 50% with diffuse hypokinesis.  Carotid Dopplers done showed no ICA stenosis. The patient is doing considerably well, she states, that her left shoulder is hurting some, and the left arm feels like it does not belong to her, at times. She also complains about mild balance problems. She has not started PT yet, and she has not followed up with her PCP.  Pain Inventory Average Pain 2 Pain Right Now 2 My pain is aching  In the last 24 hours, has pain interfered with the following? General activity 2 Relation with others 2 Enjoyment of life 2 What TIME of day is your pain at its worst? night Sleep (in general) Good  Pain is worse with: some activites Pain improves with: medication Relief from Meds: 4  Mobility use a cane ability to climb steps?  yes do you drive?  yes  Function disabled: date disabled 2005 retired I need assistance with the following:  dressing, bathing and household duties  Neuro/Psych weakness trouble walking  Prior Studies Any changes since last visit?  no  Physicians involved in your care Any changes since last visit?  no   Family History  Problem Relation Age of Onset  . Diabetes Father   . Alzheimer's disease Father   . Kidney cancer Father   . Hypertension Father   . Kidney disease Father     kidney removal  . Hypertension Mother   . Arthritis Mother   . Stroke Brother   . Diabetes Brother   . Hypertension Brother   . Diabetes Sister   . Thyroid disease Sister   . Hyperlipidemia Sister   . Other Sister     thyroidectomy   History   Social History  . Marital Status: Married    Spouse Name: N/A    Number of  Children: N/A  . Years of Education: N/A   Social History Main Topics  . Smoking status: Never Smoker   . Smokeless tobacco: None  . Alcohol Use: No  . Drug Use: No  . Sexually Active: None   Other Topics Concern  . None   Social History Narrative  . None   Past Surgical History  Procedure Date  . Spleen removal 2001  . Laparoscopic gastric banding 05/09/2008  . Abdominal hysterectomy 2002  . Cholecystectomy 1999  . Nephrectomy 2001    due to cancer   Past Medical History  Diagnosis Date  . Heart attack   . Stroke   . Hypertension   . Diabetes mellitus   . Depression   . Sleep apnea   . Hyperlipidemia   . Bronchitis   . SOB (shortness of breath)   . Arthritis   . Dizziness   . Weakness   . Chronic kidney disease     possible stones - confirm with patient  . Sore throat   . Sinus problem   . Family history of breast cancer   . Renal cancer    BP 154/97  Pulse 67  Resp 14  Ht 5\' 1"  (1.549 m)  Wt 199 lb (90.266 kg)  BMI 37.60 kg/m2  SpO2 97%  Review of Systems  Musculoskeletal: Positive for myalgias, arthralgias and gait problem.  Neurological: Positive for weakness.  All other systems reviewed and are negative.       Objective:   Physical Exam  Constitutional: She is oriented to person, place, and time. She appears well-developed and well-nourished.  HENT:  Head: Normocephalic.  Neck: Neck supple.  Musculoskeletal: She exhibits tenderness.  Neurological: She is alert and oriented to person, place, and time.  Skin: Skin is warm and dry.  Psychiatric: She has a normal mood and affect.    Symmetric normal motor tone is noted throughout. Normal muscle bulk. Muscle testing reveals 5/5 muscle strength of the upper extremity,except left triceps 4/5 and 5/5 of the lower extremity. Full range of motion in upper and lower extremities, except left should abduction 80 degrees, flex 100 degrees. ROM of spine is not restricted. Fine motor movements are  slower in the left hand.   DTR in the upper and lower extremity are present and symmetric 2+, except biceps on the left 3+. No clonus is noted.  Patient arises from chair with mild difficulty. wide based gait with decreased arm swing bilateral , able to walk on heels and toes, with some help . Tandem walk is stable. No pronator drift. Rhomberg negative.        Assessment & Plan:     1. 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 I recommend  starting PT and OT.  2. Highly recommended patient should follow up with her PCP, for control of her risk factors, like HTN,DM,HLD ! Educated patient about risk factors, necessity of PT and OT in length.

## 2012-10-26 NOTE — Telephone Encounter (Signed)
New referral placed.

## 2012-10-26 NOTE — Telephone Encounter (Signed)
PT/OT referral needs to be updated.

## 2012-10-26 NOTE — Patient Instructions (Addendum)
Start with your physical therapy and occupational therapy.Follow up with your PCP,for your HTN,DM, etc.

## 2012-11-18 ENCOUNTER — Ambulatory Visit: Payer: BC Managed Care – PPO | Attending: Physical Medicine and Rehabilitation | Admitting: Physical Therapy

## 2012-11-18 DIAGNOSIS — IMO0001 Reserved for inherently not codable concepts without codable children: Secondary | ICD-10-CM | POA: Insufficient documentation

## 2012-11-18 DIAGNOSIS — I69998 Other sequelae following unspecified cerebrovascular disease: Secondary | ICD-10-CM | POA: Insufficient documentation

## 2012-11-18 DIAGNOSIS — R269 Unspecified abnormalities of gait and mobility: Secondary | ICD-10-CM | POA: Insufficient documentation

## 2012-11-22 ENCOUNTER — Ambulatory Visit: Payer: BC Managed Care – PPO | Admitting: Physical Therapy

## 2012-11-25 ENCOUNTER — Ambulatory Visit: Payer: BC Managed Care – PPO | Admitting: Physical Therapy

## 2012-11-30 ENCOUNTER — Ambulatory Visit: Payer: BC Managed Care – PPO | Admitting: Physical Therapy

## 2012-12-02 ENCOUNTER — Ambulatory Visit: Payer: BC Managed Care – PPO | Admitting: Physical Therapy

## 2012-12-03 ENCOUNTER — Other Ambulatory Visit: Payer: Self-pay | Admitting: *Deleted

## 2012-12-03 MED ORDER — HYDROCHLOROTHIAZIDE 25 MG PO TABS: 25.0000 mg | ORAL_TABLET | Freq: Every day | ORAL | Status: DC

## 2012-12-07 ENCOUNTER — Ambulatory Visit: Payer: BC Managed Care – PPO | Attending: Internal Medicine | Admitting: Physical Therapy

## 2012-12-07 ENCOUNTER — Ambulatory Visit: Payer: BC Managed Care – PPO | Admitting: Occupational Therapy

## 2012-12-07 DIAGNOSIS — IMO0001 Reserved for inherently not codable concepts without codable children: Secondary | ICD-10-CM | POA: Insufficient documentation

## 2012-12-07 DIAGNOSIS — I69998 Other sequelae following unspecified cerebrovascular disease: Secondary | ICD-10-CM | POA: Insufficient documentation

## 2012-12-07 DIAGNOSIS — R269 Unspecified abnormalities of gait and mobility: Secondary | ICD-10-CM | POA: Insufficient documentation

## 2012-12-09 ENCOUNTER — Ambulatory Visit: Payer: BC Managed Care – PPO | Admitting: Occupational Therapy

## 2012-12-09 ENCOUNTER — Ambulatory Visit: Payer: BC Managed Care – PPO | Admitting: Physical Therapy

## 2012-12-13 ENCOUNTER — Ambulatory Visit: Payer: BC Managed Care – PPO | Admitting: Physical Therapy

## 2012-12-13 ENCOUNTER — Encounter: Payer: BC Managed Care – PPO | Admitting: Occupational Therapy

## 2012-12-14 ENCOUNTER — Ambulatory Visit: Payer: BC Managed Care – PPO | Admitting: Physical Therapy

## 2012-12-14 ENCOUNTER — Ambulatory Visit: Payer: BC Managed Care – PPO | Admitting: Occupational Therapy

## 2012-12-15 ENCOUNTER — Ambulatory Visit: Payer: BC Managed Care – PPO | Admitting: Physical Therapy

## 2012-12-15 ENCOUNTER — Encounter: Payer: BC Managed Care – PPO | Admitting: Occupational Therapy

## 2012-12-16 ENCOUNTER — Ambulatory Visit: Payer: BC Managed Care – PPO | Admitting: Occupational Therapy

## 2012-12-16 ENCOUNTER — Encounter: Payer: Self-pay | Admitting: Cardiology

## 2012-12-16 ENCOUNTER — Ambulatory Visit: Payer: BC Managed Care – PPO | Admitting: Physical Therapy

## 2012-12-21 ENCOUNTER — Ambulatory Visit: Payer: BC Managed Care – PPO | Admitting: Physical Therapy

## 2012-12-21 ENCOUNTER — Ambulatory Visit: Payer: BC Managed Care – PPO | Admitting: Occupational Therapy

## 2012-12-22 ENCOUNTER — Encounter: Payer: Medicare FFS | Attending: Physical Medicine and Rehabilitation | Admitting: Physical Medicine and Rehabilitation

## 2012-12-23 ENCOUNTER — Ambulatory Visit: Payer: BC Managed Care – PPO | Admitting: Physical Therapy

## 2012-12-23 ENCOUNTER — Ambulatory Visit: Payer: BC Managed Care – PPO | Admitting: Occupational Therapy

## 2012-12-24 ENCOUNTER — Emergency Department (HOSPITAL_COMMUNITY)
Admission: EM | Admit: 2012-12-24 | Discharge: 2012-12-24 | Disposition: A | Discharge status: Home / Self Care | Payer: BC Managed Care – PPO | Attending: Emergency Medicine | Admitting: Emergency Medicine

## 2012-12-24 ENCOUNTER — Emergency Department (HOSPITAL_COMMUNITY): Payer: BC Managed Care – PPO

## 2012-12-24 ENCOUNTER — Encounter (HOSPITAL_COMMUNITY): Payer: Self-pay | Admitting: Emergency Medicine

## 2012-12-24 DIAGNOSIS — Z8709 Personal history of other diseases of the respiratory system: Secondary | ICD-10-CM | POA: Insufficient documentation

## 2012-12-24 DIAGNOSIS — Z79899 Other long term (current) drug therapy: Secondary | ICD-10-CM | POA: Insufficient documentation

## 2012-12-24 DIAGNOSIS — S92009A Unspecified fracture of unspecified calcaneus, initial encounter for closed fracture: Secondary | ICD-10-CM | POA: Insufficient documentation

## 2012-12-24 DIAGNOSIS — G4730 Sleep apnea, unspecified: Secondary | ICD-10-CM | POA: Insufficient documentation

## 2012-12-24 DIAGNOSIS — E785 Hyperlipidemia, unspecified: Secondary | ICD-10-CM | POA: Insufficient documentation

## 2012-12-24 DIAGNOSIS — Y929 Unspecified place or not applicable: Secondary | ICD-10-CM | POA: Insufficient documentation

## 2012-12-24 DIAGNOSIS — Z8553 Personal history of malignant neoplasm of renal pelvis: Secondary | ICD-10-CM | POA: Insufficient documentation

## 2012-12-24 DIAGNOSIS — E119 Type 2 diabetes mellitus without complications: Secondary | ICD-10-CM | POA: Insufficient documentation

## 2012-12-24 DIAGNOSIS — I252 Old myocardial infarction: Secondary | ICD-10-CM | POA: Insufficient documentation

## 2012-12-24 DIAGNOSIS — N189 Chronic kidney disease, unspecified: Secondary | ICD-10-CM | POA: Insufficient documentation

## 2012-12-24 DIAGNOSIS — Z8674 Personal history of sudden cardiac arrest: Secondary | ICD-10-CM | POA: Insufficient documentation

## 2012-12-24 DIAGNOSIS — I129 Hypertensive chronic kidney disease with stage 1 through stage 4 chronic kidney disease, or unspecified chronic kidney disease: Secondary | ICD-10-CM | POA: Insufficient documentation

## 2012-12-24 DIAGNOSIS — W108XXA Fall (on) (from) other stairs and steps, initial encounter: Secondary | ICD-10-CM | POA: Insufficient documentation

## 2012-12-24 DIAGNOSIS — Z8739 Personal history of other diseases of the musculoskeletal system and connective tissue: Secondary | ICD-10-CM | POA: Insufficient documentation

## 2012-12-24 DIAGNOSIS — S92002A Unspecified fracture of left calcaneus, initial encounter for closed fracture: Secondary | ICD-10-CM

## 2012-12-24 DIAGNOSIS — Y9301 Activity, walking, marching and hiking: Secondary | ICD-10-CM | POA: Insufficient documentation

## 2012-12-24 DIAGNOSIS — Z8673 Personal history of transient ischemic attack (TIA), and cerebral infarction without residual deficits: Secondary | ICD-10-CM | POA: Insufficient documentation

## 2012-12-24 DIAGNOSIS — Z7902 Long term (current) use of antithrombotics/antiplatelets: Secondary | ICD-10-CM | POA: Insufficient documentation

## 2012-12-24 MED ORDER — OXYCODONE-ACETAMINOPHEN 5-325 MG PO TABS: 1.0000 | ORAL_TABLET | ORAL | Status: DC | PRN

## 2012-12-24 MED ORDER — OXYCODONE-ACETAMINOPHEN 5-325 MG PO TABS
2.0000 | ORAL_TABLET | Freq: Once | ORAL | Status: AC
  Administered 2012-12-24: 2 via ORAL
  Filled 2012-12-24: qty 2

## 2012-12-24 NOTE — ED Notes (Signed)
Pt sts feel down stairs today and now has left ankle pain; pt sts hx of stroke last year and sometimes falls; pt denies LOC or other complaint at present

## 2012-12-24 NOTE — ED Notes (Signed)
Pt to go to [facility] home care. Given driving directions. Rx faxed to [facility].

## 2012-12-24 NOTE — ED Notes (Signed)
Returned from xray

## 2012-12-24 NOTE — ED Provider Notes (Signed)
History     CSN: [id number]  Arrival date & time 12/24/12  1354   First MD Initiated Contact with Patient 12/24/12 1419      Chief Complaint  Patient presents with  . Fall  . Ankle Pain    (Consider location/radiation/quality/duration/timing/severity/associated sxs/prior treatment) HPI Comments: Patient with PMH of HTN, MI, and stroke presents for L ankle swelling after falling down 8 carpeted steps while at home 3 hours ago. Patient states she was walking upstairs when she lost her footing and fell down 8 steps. Patient denies LOC, CP, lightheadedness, or dizziness prior to fall. Admits to a constant throbbing sensation in her L ankle with associated swelling that is worse with bearing weight and better with rest. Patient denies headache, back pain, hip pain, and numbness or tingling of her lower extremities. Patient states her L side was affected during her previous stroke, but states weakness of her LLE is not below it's normal baseline. No vision changes, facial droop or dysphagia.  Patient is a 62 y.o. female presenting with fall and ankle pain. The history is provided by the patient. No language interpreter was used.  Fall Pertinent negatives include no fever, no numbness, no nausea, no vomiting and no headaches.  Ankle Pain Associated symptoms: no back pain, no fever and no neck pain     Past Medical History  Diagnosis Date  . Heart attack   . Stroke   . Hypertension   . Diabetes mellitus   . Depression   . Sleep apnea   . Hyperlipidemia   . Bronchitis   . SOB (shortness of breath)   . Arthritis   . Dizziness   . Weakness   . Chronic kidney disease     possible stones - confirm with patient  . Sore throat   . Sinus problem   . Family history of breast cancer   . Renal cancer     Past Surgical History  Procedure Laterality Date  . Spleen removal  2001  . Laparoscopic gastric banding  05/09/2008  . Abdominal hysterectomy  2002  . Cholecystectomy  1999  .  Nephrectomy  2001    due to cancer    Family History  Problem Relation Age of Onset  . Diabetes Father   . Alzheimer's disease Father   . Kidney cancer Father   . Hypertension Father   . Kidney disease Father     kidney removal  . Hypertension Mother   . Arthritis Mother   . Stroke Brother   . Diabetes Brother   . Hypertension Brother   . Diabetes Sister   . Thyroid disease Sister   . Hyperlipidemia Sister   . Other Sister     thyroidectomy    History  Substance Use Topics  . Smoking status: Never Smoker   . Smokeless tobacco: Not on file  . Alcohol Use: No    OB History   Grav Para Term Preterm Abortions TAB SAB Ect Mult Living                  Review of Systems  Constitutional: Negative for fever and chills.  HENT: Negative for neck pain and voice change.   Eyes: Negative for visual disturbance.  Respiratory: Negative for chest tightness and shortness of breath.   Cardiovascular: Negative for chest pain.  Gastrointestinal: Negative for nausea, vomiting and diarrhea.  Genitourinary: Negative.   Musculoskeletal: Positive for joint swelling. Negative for back pain.  Skin: Negative for color change,  pallor, rash and wound.  Neurological: Negative for syncope, light-headedness, numbness and headaches.  All other systems reviewed and are negative.    Allergies  Iohexol  Home Medications   Current Outpatient Rx  Name  Route  Sig  Dispense  Refill  . atorvastatin (LIPITOR) 20 MG tablet   Oral   Take 20 mg by mouth daily at 6 PM.         . citalopram (CELEXA) 20 MG tablet   Oral   Take 20 mg by mouth at bedtime.         . clopidogrel (PLAVIX) 75 MG tablet   Oral   Take 75 mg by mouth at bedtime.         . hydrochlorothiazide (HYDRODIURIL) 25 MG tablet   Oral   Take 25 mg by mouth daily. For blood pressure.         Marland Kitchen losartan (COZAAR) 100 MG tablet   Oral   Take 100 mg by mouth at bedtime.         . metFORMIN (GLUCOPHAGE) 500 MG  tablet   Oral   Take 500 mg by mouth 2 (two) times daily with a meal.         . venlafaxine XR (EFFEXOR-XR) 150 MG 24 hr capsule   Oral   Take 150 mg by mouth at bedtime.         . metoprolol (LOPRESSOR) 50 MG tablet   Oral   Take 100 mg by mouth daily.          Marland Kitchen oxyCODONE-acetaminophen (PERCOCET/ROXICET) 5-325 MG per tablet   Oral   Take 1 tablet by mouth every 4 (four) hours as needed for pain.   15 tablet   0     BP 139/79  Pulse 78  Temp(Src) 97.5 F (36.4 C) (Oral)  Resp 18  SpO2 98%  Physical Exam  Nursing note and vitals reviewed. Constitutional: She is oriented to person, place, and time. She appears well-developed and well-nourished. No distress.  HENT:  Head: Normocephalic and atraumatic.  Eyes: Conjunctivae are normal. Pupils are equal, round, and reactive to light. No scleral icterus.  Neck: Normal range of motion.  Musculoskeletal:       Left ankle: She exhibits decreased range of motion and swelling. She exhibits no ecchymosis, no deformity, no laceration and normal pulse. Tenderness. Medial malleolus tenderness found. Achilles tendon exhibits normal Thompson's test results.  Neurological: She is alert and oriented to person, place, and time. She has normal strength. She displays no atrophy and no tremor. No sensory deficit. She exhibits normal muscle tone.  No dysphagia, facial droop or decrease in extremity sensation or strength b/l  Skin: Skin is warm and dry. She is not diaphoretic. No erythema.  Psychiatric: She has a normal mood and affect. Her behavior is normal.    ED Course  Procedures (including critical care time)  Labs Reviewed - No data to display No results found.  Labs Reviewed - No data to display Dg Ankle Complete Left  12/24/2012  *RADIOLOGY REPORT*  Clinical Data: Fall, posterior ankle pain.  LEFT ANKLE COMPLETE - 3+ VIEW  Comparison: None.  Findings: Mildly comminuted fracture involving the posterior/superior aspect of the  calcaneus.  This includes the insertion site of the Achilles tendon, which is likely retracted/thickened.  The ankle mortise is intact.  Mild soft tissue swelling.  IMPRESSION: Mildly comminuted fracture involving the posterior/superior aspect of the calcaneus, as described above.   Original Report Authenticated  By: Charline Bills, M.D.     1. Calcaneal fracture, left, closed, initial encounter      MDM  Patient presents for L ankle pain after losing her footing and falling down 8 carpeted steps three hours ago. Patient's xray of L ankle significant for comminuted fracture of the posterior/superior aspect of the calcaneus. Short leg splint applied in ED today with instruction for no weight bearing. Patient has walker at home, but will be given script for wheelchair for home use. Script for percocet ordered to take as needed for pain; RICE management verbally explained to patient. Patient to be d/c with ortho follow up in 1 day. Patient states comfort and understanding with this plan. Patient well appearing, nontoxic, with stable vitals during ED visit.  Patient work up, Insurance account manager, and plan discussed with Dr. Blinda Leatherwood who is in agreement.  Filed Vitals:   12/24/12 1410 12/24/12 1730  BP: 130/76 139/79  Pulse: 92 78  Temp: 98.2 F (36.8 C) 97.5 F (36.4 C)  TempSrc: Oral Oral  Resp: 18   SpO2: 99% 98%           Antony Madura, PA-C 12/27/12 1457

## 2012-12-24 NOTE — Progress Notes (Signed)
Orthopedic Tech Progress Note Patient Details:  [name] 1951-08-10 [id number]  Ortho Devices Type of Ortho Device: Ace wrap;Post (short leg) splint Ortho Device/Splint Location: (L) LE Ortho Device/Splint Interventions: Application   Jennye Moccasin 12/24/2012, 7:41 PM

## 2012-12-24 NOTE — ED Notes (Signed)
Attempting to get wheelchair from homecare.

## 2012-12-24 NOTE — Progress Notes (Signed)
Orthopedic Tech Progress Note Patient Details:  [name] [birth year] [id number]  Ortho Devices Type of Ortho Device: Ace wrap;Post (long leg) splint Ortho Device/Splint Location: (L) LE Ortho Device/Splint Interventions: Application   Jennye Moccasin 12/24/2012, 3:59 PM

## 2012-12-27 NOTE — ED Notes (Signed)
[facility] called to find out name of MD that ordered equiptment.

## 2012-12-28 ENCOUNTER — Ambulatory Visit: Payer: BC Managed Care – PPO | Admitting: Occupational Therapy

## 2012-12-28 ENCOUNTER — Other Ambulatory Visit (HOSPITAL_COMMUNITY): Payer: Self-pay | Admitting: Orthopedic Surgery

## 2012-12-28 ENCOUNTER — Ambulatory Visit: Payer: BC Managed Care – PPO | Admitting: Physical Therapy

## 2012-12-28 ENCOUNTER — Encounter (HOSPITAL_COMMUNITY): Payer: Self-pay | Admitting: *Deleted

## 2012-12-29 ENCOUNTER — Encounter (HOSPITAL_COMMUNITY): Payer: Self-pay

## 2012-12-29 ENCOUNTER — Ambulatory Visit (HOSPITAL_COMMUNITY)
Admission: RE | Admit: 2012-12-29 | Discharge: 2012-12-30 | Disposition: A | Discharge status: Home / Self Care | Payer: BC Managed Care – PPO | Source: Ambulatory Visit | Attending: Orthopedic Surgery | Admitting: Orthopedic Surgery

## 2012-12-29 ENCOUNTER — Encounter (HOSPITAL_COMMUNITY)
Admission: RE | Disposition: A | Discharge status: Home / Self Care | Payer: Self-pay | Source: Ambulatory Visit | Attending: Orthopedic Surgery

## 2012-12-29 ENCOUNTER — Encounter (HOSPITAL_COMMUNITY): Payer: Self-pay | Admitting: Anesthesiology

## 2012-12-29 ENCOUNTER — Ambulatory Visit (HOSPITAL_COMMUNITY): Payer: BC Managed Care – PPO | Admitting: Anesthesiology

## 2012-12-29 DIAGNOSIS — Y92009 Unspecified place in unspecified non-institutional (private) residence as the place of occurrence of the external cause: Secondary | ICD-10-CM | POA: Insufficient documentation

## 2012-12-29 DIAGNOSIS — S86012A Strain of left Achilles tendon, initial encounter: Secondary | ICD-10-CM

## 2012-12-29 DIAGNOSIS — I10 Essential (primary) hypertension: Secondary | ICD-10-CM | POA: Insufficient documentation

## 2012-12-29 DIAGNOSIS — S92009A Unspecified fracture of unspecified calcaneus, initial encounter for closed fracture: Secondary | ICD-10-CM | POA: Insufficient documentation

## 2012-12-29 DIAGNOSIS — Z794 Long term (current) use of insulin: Secondary | ICD-10-CM | POA: Insufficient documentation

## 2012-12-29 DIAGNOSIS — W108XXA Fall (on) (from) other stairs and steps, initial encounter: Secondary | ICD-10-CM | POA: Insufficient documentation

## 2012-12-29 DIAGNOSIS — S93499A Sprain of other ligament of unspecified ankle, initial encounter: Secondary | ICD-10-CM | POA: Insufficient documentation

## 2012-12-29 DIAGNOSIS — E119 Type 2 diabetes mellitus without complications: Secondary | ICD-10-CM | POA: Insufficient documentation

## 2012-12-29 HISTORY — PX: ACHILLES TENDON SURGERY: SHX542

## 2012-12-29 HISTORY — PX: OTHER SURGICAL HISTORY: SHX169

## 2012-12-29 LAB — CBC
MCV: 88.2 fL (ref 78.0–100.0)
Platelets: 341 10*3/uL (ref 150–400)
RDW: 13.8 % (ref 11.5–15.5)
WBC: 11.3 10*3/uL — ABNORMAL HIGH (ref 4.0–10.5)

## 2012-12-29 LAB — COMPREHENSIVE METABOLIC PANEL
ALT: 33 U/L (ref 0–35)
CO2: 27 mEq/L (ref 19–32)
Calcium: 10.3 mg/dL (ref 8.4–10.5)
Creatinine, Ser: 1.13 mg/dL — ABNORMAL HIGH (ref 0.50–1.10)
GFR calc Af Amer: 60 mL/min — ABNORMAL LOW (ref >90)
GFR calc non Af Amer: 52 mL/min — ABNORMAL LOW (ref >90)
Glucose, Bld: 220 mg/dL — ABNORMAL HIGH (ref 70–99)

## 2012-12-29 LAB — PROTIME-INR: INR: 0.99 (ref 0.00–1.49)

## 2012-12-29 LAB — SURGICAL PCR SCREEN
MRSA, PCR: NEGATIVE
Staphylococcus aureus: NEGATIVE

## 2012-12-29 SURGERY — REPAIR, TENDON, ACHILLES
Anesthesia: General | Site: Leg Lower | Laterality: Left | Wound class: Clean

## 2012-12-29 MED ORDER — METOPROLOL TARTRATE 50 MG PO TABS: 50.0000 mg | ORAL_TABLET | Freq: Once | ORAL | Status: AC

## 2012-12-29 MED ORDER — ONDANSETRON HCL 4 MG/2ML IJ SOLN: 4.0000 mg | Freq: Four times a day (QID) | INTRAMUSCULAR | Status: DC | PRN

## 2012-12-29 MED ORDER — ONDANSETRON HCL 4 MG/2ML IJ SOLN
INTRAMUSCULAR | Status: DC | PRN
  Administered 2012-12-29: 4 mg via INTRAVENOUS

## 2012-12-29 MED ORDER — SUCCINYLCHOLINE CHLORIDE 20 MG/ML IJ SOLN
INTRAMUSCULAR | Status: DC | PRN
  Administered 2012-12-29: 140 mg via INTRAVENOUS

## 2012-12-29 MED ORDER — CEFAZOLIN SODIUM-DEXTROSE 2-3 GM-% IV SOLR
2.0000 g | INTRAVENOUS | Status: AC
  Administered 2012-12-29: 2 g via INTRAVENOUS

## 2012-12-29 MED ORDER — ACETAMINOPHEN 10 MG/ML IV SOLN
INTRAVENOUS | Status: AC
  Administered 2012-12-29: 1000 mg via INTRAVENOUS
  Filled 2012-12-29: qty 100

## 2012-12-29 MED ORDER — OXYCODONE-ACETAMINOPHEN 5-325 MG PO TABS: 1.0000 | ORAL_TABLET | ORAL | Status: DC | PRN

## 2012-12-29 MED ORDER — SODIUM CHLORIDE 0.9 % IV SOLN
INTRAVENOUS | Status: DC | PRN
  Administered 2012-12-29: 13:00:00 via INTRAVENOUS

## 2012-12-29 MED ORDER — PROPOFOL 10 MG/ML IV BOLUS
INTRAVENOUS | Status: DC | PRN
  Administered 2012-12-29: 150 mg via INTRAVENOUS

## 2012-12-29 MED ORDER — ROCURONIUM BROMIDE 100 MG/10ML IV SOLN
INTRAVENOUS | Status: DC | PRN
  Administered 2012-12-29: 20 mg via INTRAVENOUS

## 2012-12-29 MED ORDER — CEFAZOLIN SODIUM-DEXTROSE 2-3 GM-% IV SOLR
2.0000 g | Freq: Four times a day (QID) | INTRAVENOUS | Status: AC
  Administered 2012-12-29 – 2012-12-30 (×3): 2 g via INTRAVENOUS
  Filled 2012-12-29 (×3): qty 50

## 2012-12-29 MED ORDER — METFORMIN HCL 500 MG PO TABS
500.0000 mg | ORAL_TABLET | Freq: Two times a day (BID) | ORAL | Status: DC
  Administered 2012-12-30: 500 mg via ORAL
  Filled 2012-12-29 (×3): qty 1

## 2012-12-29 MED ORDER — ONDANSETRON HCL 4 MG PO TABS: 4.0000 mg | ORAL_TABLET | Freq: Four times a day (QID) | ORAL | Status: DC | PRN

## 2012-12-29 MED ORDER — HYDROCODONE-ACETAMINOPHEN 5-325 MG PO TABS
1.0000 | ORAL_TABLET | ORAL | Status: DC | PRN
  Administered 2012-12-29: 2 via ORAL
  Filled 2012-12-29: qty 2

## 2012-12-29 MED ORDER — LIDOCAINE HCL (CARDIAC) 20 MG/ML IV SOLN
INTRAVENOUS | Status: DC | PRN
  Administered 2012-12-29: 10 mg via INTRAVENOUS

## 2012-12-29 MED ORDER — ROPIVACAINE HCL 5 MG/ML IJ SOLN
INTRAMUSCULAR | Status: DC | PRN
  Administered 2012-12-29: 15 mL via EPIDURAL

## 2012-12-29 MED ORDER — SODIUM CHLORIDE 0.9 % IV SOLN
INTRAVENOUS | Status: DC
  Administered 2012-12-29: 13:00:00 via INTRAVENOUS

## 2012-12-29 MED ORDER — CEFAZOLIN SODIUM-DEXTROSE 2-3 GM-% IV SOLR
INTRAVENOUS | Status: AC
  Filled 2012-12-29: qty 50

## 2012-12-29 MED ORDER — 0.9 % SODIUM CHLORIDE (POUR BTL) OPTIME
TOPICAL | Status: DC | PRN
  Administered 2012-12-29: 1000 mL

## 2012-12-29 MED ORDER — MIDAZOLAM HCL 2 MG/2ML IJ SOLN
INTRAMUSCULAR | Status: AC
  Administered 2012-12-29: 1 mg
  Filled 2012-12-29: qty 2

## 2012-12-29 MED ORDER — HYDROMORPHONE HCL PF 1 MG/ML IJ SOLN: 0.2500 mg | INTRAMUSCULAR | Status: DC | PRN

## 2012-12-29 MED ORDER — VENLAFAXINE HCL ER 150 MG PO CP24
150.0000 mg | ORAL_CAPSULE | Freq: Every day | ORAL | Status: DC
  Administered 2012-12-29: 150 mg via ORAL
  Filled 2012-12-29 (×2): qty 1

## 2012-12-29 MED ORDER — LOSARTAN POTASSIUM 50 MG PO TABS
100.0000 mg | ORAL_TABLET | Freq: Every day | ORAL | Status: DC
  Administered 2012-12-29: 100 mg via ORAL
  Filled 2012-12-29 (×2): qty 2

## 2012-12-29 MED ORDER — METOPROLOL TARTRATE 100 MG PO TABS
100.0000 mg | ORAL_TABLET | Freq: Every day | ORAL | Status: DC
  Administered 2012-12-29 – 2012-12-30 (×2): 100 mg via ORAL
  Filled 2012-12-29 (×2): qty 1

## 2012-12-29 MED ORDER — SODIUM CHLORIDE 0.9 % IV SOLN: INTRAVENOUS | Status: DC

## 2012-12-29 MED ORDER — BUPIVACAINE-EPINEPHRINE PF 0.5-1:200000 % IJ SOLN
INTRAMUSCULAR | Status: DC | PRN
  Administered 2012-12-29: 30 mL

## 2012-12-29 MED ORDER — NEOSTIGMINE METHYLSULFATE 1 MG/ML IJ SOLN
INTRAMUSCULAR | Status: DC | PRN
  Administered 2012-12-29: 3 mg via INTRAVENOUS

## 2012-12-29 MED ORDER — INSULIN ASPART 100 UNIT/ML ~~LOC~~ SOLN
0.0000 [IU] | Freq: Three times a day (TID) | SUBCUTANEOUS | Status: DC
  Administered 2012-12-30 (×2): 5 [IU] via SUBCUTANEOUS

## 2012-12-29 MED ORDER — CITALOPRAM HYDROBROMIDE 20 MG PO TABS
20.0000 mg | ORAL_TABLET | Freq: Every day | ORAL | Status: DC
  Administered 2012-12-29: 20 mg via ORAL
  Filled 2012-12-29 (×2): qty 1

## 2012-12-29 MED ORDER — GLYCOPYRROLATE 0.2 MG/ML IJ SOLN
INTRAMUSCULAR | Status: DC | PRN
  Administered 2012-12-29: 0.2 mg via INTRAVENOUS

## 2012-12-29 MED ORDER — CLOPIDOGREL BISULFATE 75 MG PO TABS
75.0000 mg | ORAL_TABLET | Freq: Every day | ORAL | Status: DC
  Administered 2012-12-29: 75 mg via ORAL
  Filled 2012-12-29 (×2): qty 1

## 2012-12-29 MED ORDER — OXYCODONE HCL 5 MG PO TABS: 5.0000 mg | ORAL_TABLET | Freq: Once | ORAL | Status: DC | PRN

## 2012-12-29 MED ORDER — MUPIROCIN 2 % EX OINT: TOPICAL_OINTMENT | Freq: Two times a day (BID) | CUTANEOUS | Status: DC

## 2012-12-29 MED ORDER — METOCLOPRAMIDE HCL 10 MG PO TABS: 5.0000 mg | ORAL_TABLET | Freq: Three times a day (TID) | ORAL | Status: DC | PRN

## 2012-12-29 MED ORDER — HYDROCHLOROTHIAZIDE 25 MG PO TABS
25.0000 mg | ORAL_TABLET | Freq: Every day | ORAL | Status: DC
  Administered 2012-12-29 – 2012-12-30 (×2): 25 mg via ORAL
  Filled 2012-12-29 (×2): qty 1

## 2012-12-29 MED ORDER — METOCLOPRAMIDE HCL 5 MG/ML IJ SOLN: 5.0000 mg | Freq: Three times a day (TID) | INTRAMUSCULAR | Status: DC | PRN

## 2012-12-29 MED ORDER — FENTANYL CITRATE 0.05 MG/ML IJ SOLN
INTRAMUSCULAR | Status: DC | PRN
  Administered 2012-12-29: 250 ug via INTRAVENOUS

## 2012-12-29 MED ORDER — OXYCODONE HCL 5 MG/5ML PO SOLN: 5.0000 mg | Freq: Once | ORAL | Status: DC | PRN

## 2012-12-29 MED ORDER — FENTANYL CITRATE 0.05 MG/ML IJ SOLN
INTRAMUSCULAR | Status: AC
  Filled 2012-12-29: qty 2

## 2012-12-29 MED ORDER — ATORVASTATIN CALCIUM 20 MG PO TABS
20.0000 mg | ORAL_TABLET | Freq: Every day | ORAL | Status: DC
  Administered 2012-12-29: 20 mg via ORAL
  Filled 2012-12-29 (×2): qty 1

## 2012-12-29 MED ORDER — FENTANYL CITRATE 0.05 MG/ML IJ SOLN
100.0000 ug | Freq: Once | INTRAMUSCULAR | Status: AC
  Administered 2012-12-29: 50 ug via INTRAVENOUS

## 2012-12-29 MED ORDER — METOPROLOL TARTRATE 50 MG PO TABS
ORAL_TABLET | ORAL | Status: AC
  Administered 2012-12-29: 50 mg via ORAL
  Filled 2012-12-29: qty 1

## 2012-12-29 MED ORDER — MUPIROCIN 2 % EX OINT
TOPICAL_OINTMENT | CUTANEOUS | Status: AC
  Administered 2012-12-29: 1 via NASAL
  Filled 2012-12-29: qty 22

## 2012-12-29 MED ORDER — PROMETHAZINE HCL 25 MG/ML IJ SOLN: 6.2500 mg | INTRAMUSCULAR | Status: DC | PRN

## 2012-12-29 SURGICAL SUPPLY — 50 items
ANCH SUT 2 CP-2 EBND QANCHR+ (Anchor) ×3 IMPLANT
ANCHOR SUPER QUICK (Anchor) ×3 IMPLANT
BANDAGE GAUZE ELAST BULKY 4 IN (GAUZE/BANDAGES/DRESSINGS) ×1 IMPLANT
BLADE SURG 10 STRL SS (BLADE) ×1 IMPLANT
BNDG CMPR 9X4 STRL LF SNTH (GAUZE/BANDAGES/DRESSINGS)
BNDG COHESIVE 6X5 TAN STRL LF (GAUZE/BANDAGES/DRESSINGS) ×3 IMPLANT
BNDG ESMARK 4X9 LF (GAUZE/BANDAGES/DRESSINGS) IMPLANT
BNDG GAUZE STRTCH 6 (GAUZE/BANDAGES/DRESSINGS) ×1 IMPLANT
CLOTH BEACON ORANGE TIMEOUT ST (SAFETY) ×2 IMPLANT
COTTON STERILE ROLL (GAUZE/BANDAGES/DRESSINGS) ×1 IMPLANT
CUFF TOURNIQUET SINGLE 34IN LL (TOURNIQUET CUFF) IMPLANT
CUFF TOURNIQUET SINGLE 44IN (TOURNIQUET CUFF) IMPLANT
DRAPE INCISE IOBAN 66X45 STRL (DRAPES) ×2 IMPLANT
DRAPE U-SHAPE 47X51 STRL (DRAPES) ×2 IMPLANT
DRSG ADAPTIC 3X8 NADH LF (GAUZE/BANDAGES/DRESSINGS) ×1 IMPLANT
DRSG PAD ABDOMINAL 8X10 ST (GAUZE/BANDAGES/DRESSINGS) ×1 IMPLANT
DURAPREP 26ML APPLICATOR (WOUND CARE) ×2 IMPLANT
ELECT REM PT RETURN 9FT ADLT (ELECTROSURGICAL) ×2
ELECTRODE REM PT RTRN 9FT ADLT (ELECTROSURGICAL) ×1 IMPLANT
GLOVE BIOGEL PI IND STRL 7.0 (GLOVE) IMPLANT
GLOVE BIOGEL PI IND STRL 9 (GLOVE) ×1 IMPLANT
GLOVE BIOGEL PI INDICATOR 7.0 (GLOVE) ×2
GLOVE BIOGEL PI INDICATOR 9 (GLOVE) ×1
GLOVE SURG ORTHO 9.0 STRL STRW (GLOVE) ×2 IMPLANT
GLOVE SURG SS PI 6.5 STRL IVOR (GLOVE) ×1 IMPLANT
GOWN PREVENTION PLUS XLARGE (GOWN DISPOSABLE) ×2 IMPLANT
GOWN SRG XL XLNG 56XLVL 4 (GOWN DISPOSABLE) ×2 IMPLANT
GOWN STRL NON-REIN XL XLG LVL4 (GOWN DISPOSABLE) ×4
KIT ROOM TURNOVER OR (KITS) ×2 IMPLANT
MANIFOLD NEPTUNE II (INSTRUMENTS) ×2 IMPLANT
NDL SUT .5 MAYO 1.404X.05X (NEEDLE) ×1 IMPLANT
NEEDLE MAYO TAPER (NEEDLE) ×2
NS IRRIG 1000ML POUR BTL (IV SOLUTION) ×2 IMPLANT
PACK ORTHO EXTREMITY (CUSTOM PROCEDURE TRAY) ×2 IMPLANT
PAD ARMBOARD 7.5X6 YLW CONV (MISCELLANEOUS) ×5 IMPLANT
SPONGE GAUZE 4X4 12PLY (GAUZE/BANDAGES/DRESSINGS) ×1 IMPLANT
SPONGE LAP 18X18 X RAY DECT (DISPOSABLE) ×1 IMPLANT
SPONGE LAP 4X18 X RAY DECT (DISPOSABLE) ×2 IMPLANT
SUT ETHILON 2 0 PSLX (SUTURE) ×1 IMPLANT
SUT ETHILON 3 0 FSLX (SUTURE) ×1 IMPLANT
SUT FIBERWIRE #2 38 T-5 BLUE (SUTURE) ×4
SUT MNCRL AB 3-0 PS2 18 (SUTURE) ×1 IMPLANT
SUT VIC AB 0 CT1 27 (SUTURE) ×2
SUT VIC AB 0 CT1 27XBRD ANBCTR (SUTURE) IMPLANT
SUTURE FIBERWR #2 38 T-5 BLUE (SUTURE) ×4 IMPLANT
TOWEL OR 17X24 6PK STRL BLUE (TOWEL DISPOSABLE) ×2 IMPLANT
TOWEL OR 17X26 10 PK STRL BLUE (TOWEL DISPOSABLE) ×2 IMPLANT
TUBE CONNECTING 12X1/4 (SUCTIONS) ×2 IMPLANT
WATER STERILE IRR 1000ML POUR (IV SOLUTION) ×1 IMPLANT
YANKAUER SUCT BULB TIP NO VENT (SUCTIONS) ×2 IMPLANT

## 2012-12-29 NOTE — H&P (Signed)
[name] is an 62 y.o. female.   Chief Complaint: Pain with avulsion of the Achilles HPI: Patient is a 62 year old woman who had an acute injury to her Achilles tendon sustaining an avulsion of the calcaneus. She has superior migration of the calcaneus with pending skin ulceration.  Past Medical History  Diagnosis Date  . Hypertension   . Diabetes mellitus   . Depression   . Hyperlipidemia   . Bronchitis   . SOB (shortness of breath)   . Arthritis   . Dizziness   . Weakness   . Sore throat   . Sinus problem   . Family history of breast cancer   . Heart attack 2004    uses to see Dr [name]- doesnt see anyone since  . Stroke     left side weaker/ uses a walker, uses most of time.  . Chronic kidney disease     possible stones - confirm with patient  . Renal cancer   . Sleep apnea     study done around 05, hasnt uesd in a whiloe    Past Surgical History  Procedure Laterality Date  . Spleen removal  2001  . Laparoscopic gastric banding  05/09/2008  . Abdominal hysterectomy  2002  . Cholecystectomy  1999  . Nephrectomy  2001    due to cancer  . Carpal tunnel release      Family History  Problem Relation Age of Onset  . Diabetes Father   . Alzheimer's disease Father   . Kidney cancer Father   . Hypertension Father   . Kidney disease Father     kidney removal  . Hypertension Mother   . Arthritis Mother   . Stroke Brother   . Diabetes Brother   . Hypertension Brother   . Diabetes Sister   . Thyroid disease Sister   . Hyperlipidemia Sister   . Other Sister     thyroidectomy   Social History:  reports that she has never smoked. She does not have any smokeless tobacco history on file. She reports that she does not drink alcohol or use illicit drugs.  Allergies:  Allergies  Allergen Reactions  . Iohexol      Code: HIVES, Desc: Pt states in 2005 w/ a heart cath, she broke out in large hives and a rash.  She was given 50mg  benadryl, po.  She tolerated the  procedure well w/o complication.  Thanks., Onset Date: [id number]     No prescriptions prior to admission    No results found for this or any previous visit (from the past 48 hour(s)). No results found.  Review of Systems  All other systems reviewed and are negative.    Last menstrual period 12/28/2012. Physical Exam  On examination patient has a palpable dorsalis pedis pulse. She has tenting of the skin over the Achilles from the avulsion of the calcaneus. Patient has a pending skin ulceration and breakdown. Assessment/Plan Assessment: Avulsion calcaneus with pending skin ulceration breakdown.  Plan. Will plan for excision of the avulsed calcaneus. Reinsertion of the Achilles tendon. Risks and benefits were discussed including wound healing infection potential for additional surgery. Patient states she understands and wished to proceed at this time.  DUDA,[name] 12/29/2012, 6:20 AM

## 2012-12-29 NOTE — ED Provider Notes (Signed)
Medical screening examination/treatment/procedure(s) were performed by non-physician practitioner and as supervising physician I was immediately available for consultation/collaboration.  Colbi Schiltz J. Ramesha Poster, MD 12/29/12 0804 

## 2012-12-29 NOTE — Anesthesia Procedure Notes (Addendum)
Anesthesia Regional Block:  Popliteal block  Pre-Anesthetic Checklist: ,, timeout performed, Correct Patient, Correct Site, Correct Laterality, Correct Procedure, Correct Position, site marked, Risks and benefits discussed,  Surgical consent,  Pre-op evaluation,  At surgeon's request and post-op pain management  Laterality: Left  Prep: chloraprep       Needles:  Injection technique: Single-shot  Needle Type: Echogenic Stimulator Needle     Needle Length: 10cm 10 cm     Additional Needles:  Procedures: ultrasound guided (picture in chart) and nerve stimulator Popliteal block  Nerve Stimulator or Paresthesia:  Response: 0.4 mA,   Additional Responses:   Narrative:  Start time: 12/29/2012 12:25 PM End time: 12/29/2012 12:45 PM Injection made incrementally with aspirations every 5 mL.  Performed by: Personally  Anesthesiologist: Arta Bruce MD  Additional Notes: Monitors applied. Patient sedated. Sterile prep and drape,hand hygiene and sterile gloves were used. Relevant anatomy identified.Needle position confirmed.Local anesthetic injected incrementally after negative aspiration. Local anesthetic spread visualized around nerve(s). Vascular puncture avoided. No complications. Image printed for medical record.The patient tolerated the procedure well.  Additional Saphenous nerve block performed. 15cc Local Anesthetic mixture placed under ultrasonic guidance along the medio-inferior border of the Sartorious muscle 6 inches above the knee.  No Problems encountered.  Arta Bruce MD   Popliteal block Procedure Name: Intubation Date/Time: 12/29/2012 1:13 PM Performed by: De Nurse Pre-anesthesia Checklist: Patient identified, Emergency Drugs available, Suction available and Patient being monitored Patient Re-evaluated:Patient Re-evaluated prior to inductionOxygen Delivery Method: Circle system utilized Preoxygenation: Pre-oxygenation with 100% oxygen Intubation Type: Rapid  sequence and IV induction Ventilation: Mask ventilation without difficulty Laryngoscope Size: Miller and 3 Grade View: Grade I Tube type: Oral Tube size: 7.0 mm Number of attempts: 1 Airway Equipment and Method: Stylet Placement Confirmation: ETT inserted through vocal cords under direct vision,  breath sounds checked- equal and bilateral and positive ETCO2 Secured at: 23 cm Tube secured with: Tape Dental Injury: Teeth and Oropharynx as per pre-operative assessment

## 2012-12-29 NOTE — Anesthesia Postprocedure Evaluation (Signed)
  Anesthesia Post-op Note  Patient: [name]  Procedure(s) Performed: Procedure(s) with comments: ACHILLES TENDON REPAIR (Left) - Resection Left Calcaneal fracture and repair Achilles  Patient Location: PACU  Anesthesia Type:GA combined with regional for post-op pain  Level of Consciousness: awake, alert  and oriented  Airway and Oxygen Therapy: Patient Spontanous Breathing and Patient connected to nasal cannula oxygen  Post-op Pain: none  Post-op Assessment: Post-op Vital signs reviewed, Patient's Cardiovascular Status Stable, Respiratory Function Stable, Patent Airway and No signs of Nausea or vomiting  Post-op Vital Signs: Reviewed and stable  Complications: No apparent anesthesia complications

## 2012-12-29 NOTE — Op Note (Signed)
OPERATIVE REPORT  DATE OF SURGERY: 12/29/2012  PATIENT:  [name],  62 y.o. female  PRE-OPERATIVE DIAGNOSIS:  Left Achilles Tear, calcaneal fracture  POST-OPERATIVE DIAGNOSIS:  Left Achilles Tear, calcaneal fracture  PROCEDURE:  Procedure(s): ACHILLES TENDON REPAIR Partial calcaneal excision  SURGEON:  Surgeon(s): Nadara Mustard, MD  ANESTHESIA:   regional and general  EBL:  Minimal ML  SPECIMEN:  No Specimen  TOURNIQUET:  * No tourniquets in log *  PROCEDURE DETAILS: Patient is a 62 year old woman diabetic who is status post an avulsion fracture off the calcaneus left lower extremity. She is complete disruption of the Achilles tendon. Patient has pending skin breakdown and ulceration presents at this time for partial calcaneal excision and reconstruction of the Achilles tendon. Risks and benefits were discussed including infection neurovascular injury nonhealing of the skin need for additional surgery. Patient states he understands was pursued this time. Description of procedure patient underwent a popliteal block and was brought to the operating room she then underwent a general anesthetic. After adequate levels and anesthesia were obtained patient's placed prone on the operating table the left lower extremity was prepped using DuraPrep and draped into a sterile field. A posterior medial incision was used this was carried down to the retinaculum which was split. The end of the Achilles tendon was freshened and it completely disrupted from its insertion. There was a large free fragment the calcaneus and patient underwent partial calcaneal excision and the calcaneus is debrided back to bleeding viable healthy subchondral bone. 2 Mytec anchors were placed in the calcaneus using a Krakw technique 2 #2 FiberWire sutures were used to secure the proximal aspect of the Achilles. These were then secured to the 2 Mytec anchors. The wounds irrigated with normal saline throughout the case.  The retinaculum was closed the subcutaneous tissue was closed using 0 Vicryl the skin was closed using a Algower Donati suture technique with 2-0 nylon. The wound is covered Adaptic orthopedic sponges AB dressing Kerlix and Coban. Patient was placed in a fracture boot in plantarflexion extubated taken to the PACU in stable condition.  PLAN OF CARE: Admit for overnight observation  PATIENT DISPOSITION:  PACU - hemodynamically stable.   Nadara Mustard, MD 12/29/2012 1:56 PM

## 2012-12-29 NOTE — Anesthesia Preprocedure Evaluation (Addendum)
Anesthesia Evaluation  Patient identified by MRN, date of birth, ID band Patient awake    Reviewed: Allergy & Precautions, H&P , NPO status , Patient's Chart, lab work & pertinent test results  History of Anesthesia Complications Negative for: history of anesthetic complications  Airway Mallampati: II TM Distance: >3 FB Neck ROM: Full    Dental  (+) Teeth Intact and Dental Advisory Given   Pulmonary shortness of breath, sleep apnea ,    Pulmonary exam normal       Cardiovascular hypertension, Pt. on medications + CAD and + Past MI     Neuro/Psych PSYCHIATRIC DISORDERS Depression    GI/Hepatic negative GI ROS, Neg liver ROS,   Endo/Other  diabetes, Insulin Dependent  Renal/GU Renal disease     Musculoskeletal   Abdominal   Peds  Hematology negative hematology ROS (+)   Anesthesia Other Findings   Reproductive/Obstetrics                          Anesthesia Physical Anesthesia Plan  ASA: III  Anesthesia Plan: General   Post-op Pain Management:    Induction: Intravenous  Airway Management Planned: Oral ETT  Additional Equipment:   Intra-op Plan:   Post-operative Plan: Extubation in OR  Informed Consent: I have reviewed the patients History and Physical, chart, labs and discussed the procedure including the risks, benefits and alternatives for the proposed anesthesia with the patient or authorized representative who has indicated his/her understanding and acceptance.   Dental advisory given  Plan Discussed with: CRNA, Anesthesiologist and Surgeon  Anesthesia Plan Comments:        Anesthesia Quick Evaluation

## 2012-12-29 NOTE — Preoperative (Signed)
Beta Blockers   Reason not to administer Beta Blockers:Not Applicable, last dose 12/29/12 at 11:54

## 2012-12-29 NOTE — Transfer of Care (Signed)
Immediate Anesthesia Transfer of Care Note  Patient: [name]  Procedure(s) Performed: Procedure(s) with comments: ACHILLES TENDON REPAIR (Left) - Resection Left Calcaneal fracture and repair Achilles  Patient Location: PACU  Anesthesia Type:General  Level of Consciousness: oriented, patient cooperative, lethargic and responds to stimulation  Airway & Oxygen Therapy: Patient Spontanous Breathing and Patient connected to nasal cannula oxygen  Post-op Assessment: Report given to PACU RN  Post vital signs: Reviewed and stable  Complications: No apparent anesthesia complications

## 2012-12-30 ENCOUNTER — Encounter: Payer: BC Managed Care – PPO | Admitting: Occupational Therapy

## 2012-12-30 ENCOUNTER — Encounter (HOSPITAL_COMMUNITY): Payer: Self-pay | Admitting: General Practice

## 2012-12-30 ENCOUNTER — Ambulatory Visit: Payer: BC Managed Care – PPO | Admitting: Physical Therapy

## 2012-12-30 LAB — GLUCOSE, CAPILLARY
Glucose-Capillary: 174 mg/dL — ABNORMAL HIGH (ref 70–99)
Glucose-Capillary: 249 mg/dL — ABNORMAL HIGH (ref 70–99)

## 2012-12-30 MED ORDER — OXYCODONE-ACETAMINOPHEN 5-325 MG PO TABS: 1.0000 | ORAL_TABLET | ORAL | Status: DC | PRN

## 2012-12-30 NOTE — Evaluation (Signed)
Physical Therapy Evaluation Patient Details Name: [name] MRN: [id number] DOB: 05/17/1951 Today's Date: 12/30/2012 Time: 4098-1191 PT Time Calculation (min): 26 min  PT Assessment / Plan / Recommendation Clinical Impression  pt is s/p achilles tendon repair with calcaneal excision; Pt will benefit from HHPT and assist from her husband at home; She has a RW  and w/c but states parts are missing from  3in1 and she needs a new one; Recommend pt function from a w/c level at home untile deemed safe to amb per HHPT;    PT Assessment  All further PT needs can be met in the next venue of care    Follow Up Recommendations  Home health PT    Does the patient have the potential to tolerate intense rehabilitation      Barriers to Discharge        Equipment Recommendations  Other (comment) (3in1)    Recommendations for Other Services     Frequency      Precautions / Restrictions Precautions Precautions: Fall Required Braces or Orthoses: Other Brace/Splint Other Brace/Splint: camboot Restrictions LLE Weight Bearing: Non weight bearing   Pertinent Vitals/Pain Left lower leg pain, RN aware      Mobility  Bed Mobility Bed Mobility: Not assessed Details for Bed Mobility Assistance: pt up to chair with nursing staff Transfers Transfers: Sit to Stand;Stand to Sit Sit to Stand: 4: Min assist Stand to Sit: 4: Min assist Details for Transfer Assistance: multi modal cues for hand placement and LLE position/NWB Ambulation/Gait Ambulation/Gait Assistance: 4: Min assist;3: Mod assist Ambulation Distance (Feet): 4 Feet Assistive device: Rolling walker Ambulation/Gait Assistance Details: pt fearful and with difficulty shifting wt to UEs and maintaining NWB Gait Pattern: Step-to pattern Gait velocity: slow    Exercises     PT Diagnosis: Difficulty walking  PT Problem List: Decreased strength;Decreased activity tolerance;Decreased balance;Decreased mobility;Decreased knowledge of  use of DME;Decreased safety awareness;Decreased knowledge of precautions PT Treatment Interventions:     PT Goals    Visit Information  Last PT Received On: 12/30/12 Assistance Needed: +1    Subjective Data  Subjective: I fell down some stairs Patient Stated Goal: home   Prior Functioning  Home Living Lives With: Spouse Available Help at Discharge: Family;Available PRN/intermittently Type of Home: House Home Access: Ramped entrance Home Layout: Two level;1/2 bath on main level Home Adaptive Equipment: Walker - rolling;Wheelchair - manual;Other (comment) Additional Comments: has been at home NWB since injury last Fri; Has stair lift Prior Function Level of Independence: Independent with assistive device(s) Communication Communication: No difficulties    Cognition  Cognition Overall Cognitive Status: Appears within functional limits for tasks assessed/performed Arousal/Alertness: Awake/alert Behavior During Session: [facility] for tasks performed Cognition - Other Comments: requires frequent re-direction    Extremity/Trunk Assessment Right Upper Extremity Assessment RUE ROM/Strength/Tone: [facility] for tasks assessed Left Upper Extremity Assessment LUE ROM/Strength/Tone: [facility] for tasks assessed Right Lower Extremity Assessment RLE ROM/Strength/Tone: [facility] for tasks assessed Left Lower Extremity Assessment LLE ROM/Strength/Tone: Deficits;Unable to fully assess;Due to precautions LLE ROM/Strength/Tone Deficits: unable to move toes, states they are "numb";   Balance Static Standing Balance Static Standing - Balance Support: Bilateral upper extremity supported Static Standing - Level of Assistance: 5: Stand by assistance  End of Session PT - End of Session Equipment Utilized During Treatment: Gait belt;Other (comment) (camboot) Activity Tolerance: Patient limited by fatigue Patient left: in chair;with call bell/phone within reach;with family/visitor present  GP Functional Assessment  Tool Used: clinical judgement Functional Limitation: Mobility:  Walking and moving around Mobility: Walking and Moving Around Current Status [phone]): At least 1 percent but less than 20 percent impaired, limited or restricted Mobility: Walking and Moving Around Goal Status [phone]): At least 1 percent but less than 20 percent impaired, limited or restricted Mobility: Walking and Moving Around Discharge Status [phone]): At least 1 percent but less than 20 percent impaired, limited or restricted   [facility] 12/30/2012, 12:17 PM

## 2012-12-30 NOTE — Progress Notes (Signed)
CARE MANAGEMENT NOTE 12/30/2012  Patient:  ARCHER, VISE   Account Number:  [id number]  Date Initiated:  12/30/2012  Documentation initiated by:  Vance Peper  Subjective/Objective Assessment:   62 yr old female s/p left partial calcaneal fracture.     Action/Plan:   CM spoke with patient and husband concerning home health and DME needs. Choice offered. patient will be staying in [place] and going to Her family in [place].   Anticipated DC Date:  12/30/2012   Anticipated DC Plan:  HOME W HOME HEALTH SERVICES      DC Planning Services  CM consult      [facility]  HOME HEALTH  DURABLE MEDICAL EQUIPMENT   Choice offered to / List presented to:  C-1 Patient   DME arranged  3-N-1      DME agency  [facility]     HH arranged  HH-2 PT      [facility] agency  [facility]  OTHER - SEE NOTE   Status of service:  Completed, signed off Medicare Important Message given?   (If response is "NO", the following Medicare IM given date fields will be blank) Date Medicare IM given:   Date Additional Medicare IM given:    Discharge Disposition:  HOME W HOME HEALTH SERVICES  Per UR Regulation:    If discussed at Long Length of Stay Meetings, dates discussed:    Comments:  12/30/12 3:05p  Vance Peper, RN BSN Case Manager patient will be staying with family in [place]. CM contacted [facility] [phone]. They wil be able to provide [facility] PT. Faxing orders NF:AOZHY @ [phone].  Patient going to: [address]        [phone]                              Thurmond Butts  [id number]  Cell: [phone] Sister-Bonita #[phone]

## 2012-12-30 NOTE — Discharge Summary (Signed)
Physician Discharge Summary  Patient ID: [name] MRN: [id number] DOB/AGE: 60-Mar-1952 60 y.o.  Admit date: 12/29/2012 Discharge date: 12/30/2012  Admission Diagnoses: Left Achilles tendon rupture with calcaneus fracture.  Discharge Diagnoses: Same Active Problems:   * No active hospital problems. *   Discharged Condition: stable  Hospital Course: Patient's hospital course was essentially unremarkable. She underwent partial calcaneal excision and reattachment of the calcaneus to the Achilles with Achilles reconstruction. Postoperatively patient progressed well and was discharged to home in stable condition.  Consults: None  Significant Diagnostic Studies: labs: Routine labs  Treatments: surgery: See operative note  Discharge Exam: Blood pressure 135/68, pulse 66, temperature 98.6 F (37 C), temperature source Oral, resp. rate 18, last menstrual period 12/28/2012, SpO2 100.00%. Incision/Wound: clean and dry  Disposition: 01-Home or Self Care  Discharge Orders   Future Appointments Shalice Woodring Department Dept Phone   01/04/2013 9:45 AM Colonel Bald, OT [facility] [phone]   01/06/2013 9:45 AM Colonel Bald, OT [facility] [phone]   01/11/2013 10:00 AM Su Monks, PA-C [facility] [phone]   01/11/2013 11:15 AM Colonel Bald, OT [facility] [phone]   01/13/2013 2:30 PM Colonel Bald, [place] [facility] [phone]   01/18/2013 9:45 AM Colonel Bald, OT [facility] [phone]   01/21/2013 9:45 AM Colonel Bald, OT [facility] [phone]   01/25/2013 10:30 AM Colonel Bald, OT [facility] [phone]   01/28/2013 10:30 AM  Colonel Bald, OT Out[facility] [phone]   Future Orders Complete By Expires     Call MD / Call 911  As directed     Comments:      If you experience chest pain or shortness of breath, CALL 911 and be transported to the hospital emergency room.  If you develope a fever above 101 F, pus (white drainage) or increased drainage or redness at the wound, or calf pain, call your surgeon's office.    Constipation Prevention  As directed     Comments:      Drink plenty of fluids.  Prune juice may be helpful.  You may use a stool softener, such as Colace (over the counter) 100 mg twice a day.  Use MiraLax (over the counter) for constipation as needed.    Diet - low sodium heart healthy  As directed     Increase activity slowly as tolerated  As directed     Weight bearing as tolerated  As directed     Scheduling Instructions:      Minimize weightbearing left lower extremity        Medication List    TAKE these medications       atorvastatin 20 MG tablet  Commonly known as:  LIPITOR  Take 20 mg by mouth daily at 6 PM.     citalopram 20 MG tablet  Commonly known as:  CELEXA  Take 20 mg by mouth at bedtime.     clopidogrel 75 MG tablet  Commonly known as:  PLAVIX  Take 75 mg by mouth at bedtime.     hydrochlorothiazide 25 MG tablet  Commonly known as:  HYDRODIURIL  Take 25 mg by mouth daily. For blood pressure.     losartan 100 MG tablet  Commonly known as:  COZAAR  Take 100 mg by mouth at bedtime.     metFORMIN 500 MG tablet  Commonly known as:  GLUCOPHAGE  Take 500 mg by mouth 2 (two) times daily with a meal.     metoprolol 50 MG tablet  Commonly known as:  LOPRESSOR  Take 100 mg by mouth daily.     oxyCODONE-acetaminophen 5-325 MG per tablet  Commonly known as:  PERCOCET/ROXICET  Take 1 tablet by mouth every 4 (four) hours as needed for pain.     oxyCODONE-acetaminophen 5-325 MG per tablet  Commonly known as:  ROXICET  Take 1  tablet by mouth every 4 (four) hours as needed for pain.     venlafaxine XR 150 MG 24 hr capsule  Commonly known as:  EFFEXOR-XR  Take 150 mg by mouth at bedtime.           Follow-up Information   Follow up with DUDA,[name], MD In 2 weeks.   Contact information:   [address] [place] [address] [phone]       Signed: Nadara Mustard 12/30/2012, 6:35 AM

## 2012-12-30 NOTE — Progress Notes (Signed)
Pt discharged to home accompanied by family. Discharge information and rx given and explained. Pt stated understanding. Pts IV was removed. Pt left unit in a stable condition via wheelchair.

## 2013-01-04 ENCOUNTER — Ambulatory Visit: Payer: BC Managed Care – PPO | Attending: Internal Medicine | Admitting: Occupational Therapy

## 2013-01-04 DIAGNOSIS — R269 Unspecified abnormalities of gait and mobility: Secondary | ICD-10-CM | POA: Insufficient documentation

## 2013-01-04 DIAGNOSIS — IMO0001 Reserved for inherently not codable concepts without codable children: Secondary | ICD-10-CM | POA: Insufficient documentation

## 2013-01-04 DIAGNOSIS — I69998 Other sequelae following unspecified cerebrovascular disease: Secondary | ICD-10-CM | POA: Insufficient documentation

## 2013-01-06 ENCOUNTER — Ambulatory Visit: Payer: BC Managed Care – PPO | Admitting: Occupational Therapy

## 2013-01-09 ENCOUNTER — Observation Stay (HOSPITAL_COMMUNITY)
Admission: EM | Admit: 2013-01-09 | Discharge: 2013-01-13 | Disposition: A | Discharge status: Skilled Nursing Facility | Payer: BC Managed Care – PPO | Attending: Internal Medicine | Admitting: Internal Medicine

## 2013-01-09 ENCOUNTER — Encounter (HOSPITAL_COMMUNITY): Payer: Self-pay

## 2013-01-09 ENCOUNTER — Emergency Department (HOSPITAL_COMMUNITY): Payer: BC Managed Care – PPO

## 2013-01-09 DIAGNOSIS — S42212A Unspecified displaced fracture of surgical neck of left humerus, initial encounter for closed fracture: Secondary | ICD-10-CM

## 2013-01-09 DIAGNOSIS — R627 Adult failure to thrive: Secondary | ICD-10-CM | POA: Insufficient documentation

## 2013-01-09 DIAGNOSIS — Y92009 Unspecified place in unspecified non-institutional (private) residence as the place of occurrence of the external cause: Secondary | ICD-10-CM | POA: Insufficient documentation

## 2013-01-09 DIAGNOSIS — E119 Type 2 diabetes mellitus without complications: Secondary | ICD-10-CM | POA: Diagnosis present

## 2013-01-09 DIAGNOSIS — I69959 Hemiplegia and hemiparesis following unspecified cerebrovascular disease affecting unspecified side: Secondary | ICD-10-CM | POA: Insufficient documentation

## 2013-01-09 DIAGNOSIS — W19XXXA Unspecified fall, initial encounter: Secondary | ICD-10-CM

## 2013-01-09 DIAGNOSIS — S42302D Unspecified fracture of shaft of humerus, left arm, subsequent encounter for fracture with routine healing: Secondary | ICD-10-CM

## 2013-01-09 DIAGNOSIS — J40 Bronchitis, not specified as acute or chronic: Secondary | ICD-10-CM

## 2013-01-09 DIAGNOSIS — G4710 Hypersomnia, unspecified: Secondary | ICD-10-CM

## 2013-01-09 DIAGNOSIS — E785 Hyperlipidemia, unspecified: Secondary | ICD-10-CM | POA: Diagnosis present

## 2013-01-09 DIAGNOSIS — S42309A Unspecified fracture of shaft of humerus, unspecified arm, initial encounter for closed fracture: Secondary | ICD-10-CM

## 2013-01-09 DIAGNOSIS — Z905 Acquired absence of kidney: Secondary | ICD-10-CM

## 2013-01-09 DIAGNOSIS — W19XXXD Unspecified fall, subsequent encounter: Secondary | ICD-10-CM

## 2013-01-09 DIAGNOSIS — Z9089 Acquired absence of other organs: Secondary | ICD-10-CM

## 2013-01-09 DIAGNOSIS — R531 Weakness: Secondary | ICD-10-CM

## 2013-01-09 DIAGNOSIS — M25519 Pain in unspecified shoulder: Secondary | ICD-10-CM | POA: Insufficient documentation

## 2013-01-09 DIAGNOSIS — I219 Acute myocardial infarction, unspecified: Secondary | ICD-10-CM

## 2013-01-09 DIAGNOSIS — I10 Essential (primary) hypertension: Secondary | ICD-10-CM | POA: Diagnosis present

## 2013-01-09 DIAGNOSIS — S42213A Unspecified displaced fracture of surgical neck of unspecified humerus, initial encounter for closed fracture: Principal | ICD-10-CM | POA: Insufficient documentation

## 2013-01-09 DIAGNOSIS — E559 Vitamin D deficiency, unspecified: Secondary | ICD-10-CM | POA: Diagnosis present

## 2013-01-09 DIAGNOSIS — F329 Major depressive disorder, single episode, unspecified: Secondary | ICD-10-CM

## 2013-01-09 DIAGNOSIS — R29818 Other symptoms and signs involving the nervous system: Secondary | ICD-10-CM

## 2013-01-09 DIAGNOSIS — F32A Depression, unspecified: Secondary | ICD-10-CM

## 2013-01-09 DIAGNOSIS — I639 Cerebral infarction, unspecified: Secondary | ICD-10-CM

## 2013-01-09 DIAGNOSIS — Z9181 History of falling: Secondary | ICD-10-CM | POA: Insufficient documentation

## 2013-01-09 DIAGNOSIS — J309 Allergic rhinitis, unspecified: Secondary | ICD-10-CM

## 2013-01-09 DIAGNOSIS — G4730 Sleep apnea, unspecified: Secondary | ICD-10-CM

## 2013-01-09 DIAGNOSIS — IMO0001 Reserved for inherently not codable concepts without codable children: Secondary | ICD-10-CM | POA: Insufficient documentation

## 2013-01-09 LAB — URINALYSIS, ROUTINE W REFLEX MICROSCOPIC
Glucose, UA: NEGATIVE mg/dL
Hgb urine dipstick: NEGATIVE
Ketones, ur: NEGATIVE mg/dL
Protein, ur: NEGATIVE mg/dL
Urobilinogen, UA: 0.2 mg/dL (ref 0.0–1.0)

## 2013-01-09 LAB — CBC
Hemoglobin: 12.6 g/dL (ref 12.0–15.0)
MCH: 29.9 pg (ref 26.0–34.0)
Platelets: 330 10*3/uL (ref 150–400)
RBC: 4.22 MIL/uL (ref 3.87–5.11)
WBC: 16.5 10*3/uL — ABNORMAL HIGH (ref 4.0–10.5)

## 2013-01-09 LAB — CBC WITH DIFFERENTIAL/PLATELET
Basophils Absolute: 0.1 10*3/uL (ref 0.0–0.1)
Basophils Relative: 0 % (ref 0–1)
Eosinophils Absolute: 0 10*3/uL (ref 0.0–0.7)
Eosinophils Relative: 0 % (ref 0–5)
HCT: 37.5 % (ref 36.0–46.0)
Hemoglobin: 12.7 g/dL (ref 12.0–15.0)
MCH: 29.8 pg (ref 26.0–34.0)
MCHC: 33.9 g/dL (ref 30.0–36.0)
MCV: 88 fL (ref 78.0–100.0)
Monocytes Absolute: 1.4 10*3/uL — ABNORMAL HIGH (ref 0.1–1.0)
Monocytes Relative: 8 % (ref 3–12)
Neutro Abs: 11.9 10*3/uL — ABNORMAL HIGH (ref 1.7–7.7)
RDW: 13.7 % (ref 11.5–15.5)

## 2013-01-09 LAB — GLUCOSE, CAPILLARY
Glucose-Capillary: 242 mg/dL — ABNORMAL HIGH (ref 70–99)
Glucose-Capillary: 143 mg/dL — ABNORMAL HIGH (ref 70–99)

## 2013-01-09 LAB — CREATININE, SERUM
Creatinine, Ser: 0.88 mg/dL (ref 0.50–1.10)
GFR calc Af Amer: 82 mL/min — ABNORMAL LOW (ref >90)
GFR calc non Af Amer: 71 mL/min — ABNORMAL LOW (ref >90)

## 2013-01-09 LAB — COMPREHENSIVE METABOLIC PANEL
AST: 28 U/L (ref 0–37)
BUN: 17 mg/dL (ref 6–23)
CO2: 24 mEq/L (ref 19–32)
Calcium: 10.1 mg/dL (ref 8.4–10.5)
Creatinine, Ser: 0.9 mg/dL (ref 0.50–1.10)
GFR calc Af Amer: 80 mL/min — ABNORMAL LOW (ref >90)
GFR calc non Af Amer: 69 mL/min — ABNORMAL LOW (ref >90)

## 2013-01-09 MED ORDER — LOSARTAN POTASSIUM 50 MG PO TABS
100.0000 mg | ORAL_TABLET | Freq: Every day | ORAL | Status: DC
  Administered 2013-01-10 – 2013-01-12 (×4): 100 mg via ORAL
  Filled 2013-01-09 (×7): qty 2

## 2013-01-09 MED ORDER — CITALOPRAM HYDROBROMIDE 10 MG PO TABS
10.0000 mg | ORAL_TABLET | Freq: Every day | ORAL | Status: DC
  Administered 2013-01-10 – 2013-01-13 (×5): 10 mg via ORAL
  Filled 2013-01-09 (×5): qty 1

## 2013-01-09 MED ORDER — MORPHINE SULFATE 2 MG/ML IJ SOLN: 2.0000 mg | INTRAMUSCULAR | Status: DC | PRN

## 2013-01-09 MED ORDER — PERCOCET 5-325 MG PO TABS: 1.0000 | ORAL_TABLET | ORAL | Status: DC | PRN

## 2013-01-09 MED ORDER — SODIUM CHLORIDE 0.9 % IV SOLN
INTRAVENOUS | Status: DC
  Administered 2013-01-10: 1000 mL via INTRAVENOUS
  Administered 2013-01-10: 11:00:00 via INTRAVENOUS

## 2013-01-09 MED ORDER — METOPROLOL TARTRATE 50 MG PO TABS
50.0000 mg | ORAL_TABLET | Freq: Two times a day (BID) | ORAL | Status: DC
  Administered 2013-01-10 – 2013-01-12 (×6): 50 mg via ORAL
  Filled 2013-01-09 (×7): qty 1

## 2013-01-09 MED ORDER — MORPHINE SULFATE 4 MG/ML IJ SOLN
4.0000 mg | INTRAMUSCULAR | Status: DC | PRN
  Administered 2013-01-10: 4 mg via INTRAVENOUS
  Filled 2013-01-09: qty 1

## 2013-01-09 MED ORDER — MORPHINE SULFATE 4 MG/ML IJ SOLN
4.0000 mg | Freq: Once | INTRAMUSCULAR | Status: AC
  Administered 2013-01-09: 4 mg via INTRAVENOUS
  Filled 2013-01-09: qty 1

## 2013-01-09 MED ORDER — HYDROCODONE-ACETAMINOPHEN 5-325 MG PO TABS
2.0000 | ORAL_TABLET | Freq: Once | ORAL | Status: AC
  Administered 2013-01-09: 2 via ORAL
  Filled 2013-01-09: qty 2

## 2013-01-09 MED ORDER — OXYCODONE-ACETAMINOPHEN 5-325 MG PO TABS
1.0000 | ORAL_TABLET | ORAL | Status: DC | PRN
  Administered 2013-01-10 – 2013-01-13 (×13): 1 via ORAL
  Filled 2013-01-09 (×13): qty 1

## 2013-01-09 MED ORDER — VENLAFAXINE HCL ER 150 MG PO CP24
150.0000 mg | ORAL_CAPSULE | Freq: Every day | ORAL | Status: DC
  Administered 2013-01-10 – 2013-01-12 (×4): 150 mg via ORAL
  Filled 2013-01-09 (×5): qty 1

## 2013-01-09 MED ORDER — METFORMIN HCL 500 MG PO TABS
500.0000 mg | ORAL_TABLET | Freq: Two times a day (BID) | ORAL | Status: DC
  Administered 2013-01-10: 500 mg via ORAL
  Filled 2013-01-09 (×3): qty 1

## 2013-01-09 MED ORDER — SODIUM CHLORIDE 0.45 % IV SOLN: INTRAVENOUS | Status: DC

## 2013-01-09 MED ORDER — SODIUM CHLORIDE 0.9 % IV SOLN
INTRAVENOUS | Status: DC
  Administered 2013-01-09: 19:00:00 via INTRAVENOUS

## 2013-01-09 MED ORDER — ATORVASTATIN CALCIUM 20 MG PO TABS
20.0000 mg | ORAL_TABLET | Freq: Every day | ORAL | Status: DC
  Administered 2013-01-10 – 2013-01-12 (×3): 20 mg via ORAL
  Filled 2013-01-09 (×4): qty 1

## 2013-01-09 MED ORDER — ENOXAPARIN SODIUM 40 MG/0.4ML ~~LOC~~ SOLN
40.0000 mg | SUBCUTANEOUS | Status: DC
  Administered 2013-01-10 – 2013-01-12 (×4): 40 mg via SUBCUTANEOUS
  Filled 2013-01-09 (×5): qty 0.4

## 2013-01-09 MED ORDER — CLOPIDOGREL BISULFATE 75 MG PO TABS
75.0000 mg | ORAL_TABLET | Freq: Every day | ORAL | Status: DC
  Administered 2013-01-10 – 2013-01-12 (×4): 75 mg via ORAL
  Filled 2013-01-09 (×5): qty 1

## 2013-01-09 MED ORDER — HYDROCHLOROTHIAZIDE 25 MG PO TABS
25.0000 mg | ORAL_TABLET | Freq: Every day | ORAL | Status: DC
  Administered 2013-01-11 – 2013-01-13 (×2): 25 mg via ORAL
  Filled 2013-01-09 (×5): qty 1

## 2013-01-09 NOTE — ED Notes (Signed)
Spoke with Nurse case Manager Alisia about coordinating home health for the pt. Case manager will come and talk to the pt.

## 2013-01-09 NOTE — ED Notes (Signed)
Pt states "I'm not leaving without getting home help for whatever it takes even if have to call the president."

## 2013-01-09 NOTE — Progress Notes (Signed)
Orthopedic Tech Progress Note Patient Details:  [name] 12-20-1950 [id number] Sling immobilizer applied to Left UE with no complications. Patient education provided.  Ortho Devices Type of Ortho Device: Sling immobilizer Ortho Device/Splint Location: Left UE Ortho Device/Splint Interventions: Application   Asia R Thompson 01/09/2013, 5:09 PM

## 2013-01-09 NOTE — Progress Notes (Signed)
Referral- SNF vs Home Health   NCM spoke to pt and states she is unable to care for herself at home while her husband is at work. Husband states he works 12 hours days. They have a family friend that comes to check on her if she needs during the day. Pt is requesting SNF-rehab. NCM did follow up with attending and orders for PT/OT eval and treatment for possible SNF placement. Pt has wheelchair at home.   Isidoro Donning RN CCM Case Mgmt phone [phone]

## 2013-01-09 NOTE — ED Notes (Signed)
Floor called to give report and receiving RN is getting shift report. Receiving RN will call back to get report.

## 2013-01-09 NOTE — ED Notes (Signed)
Social work called for consult, states social can only help pt that want be placed in a skill nursing facility. Nurse case manager assist with pt that need help at home. Nurse case manager called and waiting for call back.

## 2013-01-09 NOTE — ED Notes (Signed)
Casework at bedside 

## 2013-01-09 NOTE — ED Notes (Signed)
Ortho tech at bedside to apply shoulder sling.

## 2013-01-09 NOTE — H&P (Signed)
[name] is an 62 y.o. female.   Chief Complaint: Status post fall today HPI: A 62 year old female who has had recurrent falls recently with fractures of calcaneus about a week ago and now humeral fracture. Patient has history of hypertension and diabetes and previous stroke. She has been using a walker since her 2 episodes of strokes. She had a fall on March the 5th leading to left calcaneal fracture which was repaired by Dr. Aldean Baker. She went home was recuperating on had another fall today. Now she has a left humeral fracture. He is here with her husband who has been unable to give her care at home especially with multiple fractures. She reports being weak generally but no focal new weakness. She denied passing out but felt lightheaded prior to the falls. She denied any chest pain or palpitations. No nausea vomiting or diarrhea. She has a 5/10 pain on her left upper extremities when she has her fracture otherwise she's at her baseline.  Past Medical History  Diagnosis Date  . Hypertension   . Diabetes mellitus   . Depression   . Hyperlipidemia   . Bronchitis   . SOB (shortness of breath)   . Arthritis   . Dizziness   . Weakness   . Sore throat   . Sinus problem   . Family history of breast cancer   . Heart attack 2004    uses to see Dr [name]- doesnt see anyone since  . Stroke     left side weaker/ uses a walker, uses most of time.  . Chronic kidney disease     possible stones - confirm with patient  . Renal cancer   . Sleep apnea     study done around 05, hasnt uesd in a whiloe    Past Surgical History  Procedure Laterality Date  . Spleen removal  2001  . Laparoscopic gastric banding  05/09/2008  . Abdominal hysterectomy  2002  . Cholecystectomy  1999  . Nephrectomy  2001    due to cancer  . Carpal tunnel release    .  fracture achilles Left 12/29/2012    achilles calcaneal fracture      Dr Lajoyce Corners  . Achilles tendon surgery Left 12/29/2012    Procedure: ACHILLES  TENDON REPAIR;  Surgeon: Nadara Mustard, MD;  Location: [facility] OR;  Service: Orthopedics;  Laterality: Left;  Resection Left Calcaneal fracture and repair Achilles    Family History  Problem Relation Age of Onset  . Diabetes Father   . Alzheimer's disease Father   . Kidney cancer Father   . Hypertension Father   . Kidney disease Father     kidney removal  . Hypertension Mother   . Arthritis Mother   . Stroke Brother   . Diabetes Brother   . Hypertension Brother   . Diabetes Sister   . Thyroid disease Sister   . Hyperlipidemia Sister   . Other Sister     thyroidectomy   Social History:  reports that she has never smoked. She has never used smokeless tobacco. She reports that she does not drink alcohol or use illicit drugs.  Allergies:  Allergies  Allergen Reactions  . Iohexol      Code: HIVES, Desc: Pt states in 2005 w/ a heart cath, she broke out in large hives and a rash.  She was given 50mg  benadryl, po.  She tolerated the procedure well w/o complication.  Thanks., Onset Date: 13086578     Medications  Prior to Admission  Medication Sig Dispense Refill  . atorvastatin (LIPITOR) 20 MG tablet Take 20 mg by mouth daily at 6 PM.      . citalopram (CELEXA) 10 MG tablet Take 10 mg by mouth daily.      . clopidogrel (PLAVIX) 75 MG tablet Take 75 mg by mouth at bedtime.      . hydrochlorothiazide (HYDRODIURIL) 25 MG tablet Take 25 mg by mouth daily. For blood pressure.      Marland Kitchen losartan (COZAAR) 100 MG tablet Take 100 mg by mouth at bedtime.      . metFORMIN (GLUCOPHAGE) 500 MG tablet Take 500 mg by mouth 2 (two) times daily with a meal.      . metoprolol (LOPRESSOR) 50 MG tablet Take 50 mg by mouth 2 (two) times daily.       Marland Kitchen oxyCODONE-acetaminophen (ROXICET) 5-325 MG per tablet Take 1 tablet by mouth every 4 (four) hours as needed for pain.  60 tablet  0  . venlafaxine XR (EFFEXOR-XR) 150 MG 24 hr capsule Take 150 mg by mouth at bedtime.        Results for orders placed during the  hospital encounter of 01/09/13 (from the past 48 hour(s))  URINALYSIS, ROUTINE W REFLEX MICROSCOPIC     Status: None   Collection Time    01/09/13 11:51 AM      Result Value Range   Color, Urine YELLOW  YELLOW   APPearance CLEAR  CLEAR   Specific Gravity, Urine 1.012  1.005 - 1.030   pH 8.0  5.0 - 8.0   Glucose, UA NEGATIVE  NEGATIVE mg/dL   Hgb urine dipstick NEGATIVE  NEGATIVE   Bilirubin Urine NEGATIVE  NEGATIVE   Ketones, ur NEGATIVE  NEGATIVE mg/dL   Protein, ur NEGATIVE  NEGATIVE mg/dL   Urobilinogen, UA 0.2  0.0 - 1.0 mg/dL   Nitrite NEGATIVE  NEGATIVE   Leukocytes, UA NEGATIVE  NEGATIVE   Comment: MICROSCOPIC NOT DONE ON URINES WITH NEGATIVE PROTEIN, BLOOD, LEUKOCYTES, NITRITE, OR GLUCOSE <1000 mg/dL.  GLUCOSE, CAPILLARY     Status: Abnormal   Collection Time    01/09/13 12:01 PM      Result Value Range   Glucose-Capillary 242 (*) 70 - 99 mg/dL  CBC WITH DIFFERENTIAL     Status: Abnormal   Collection Time    01/09/13  6:09 PM      Result Value Range   WBC 17.2 (*) 4.0 - 10.5 K/uL   RBC 4.26  3.87 - 5.11 MIL/uL   Hemoglobin 12.7  12.0 - 15.0 g/dL   HCT 09.8  11.9 - 14.7 %   MCV 88.0  78.0 - 100.0 fL   MCH 29.8  26.0 - 34.0 pg   MCHC 33.9  30.0 - 36.0 g/dL   RDW 82.9  56.2 - 13.0 %   Platelets 365  150 - 400 K/uL   Neutrophils Relative 69  43 - 77 %   Neutro Abs 11.9 (*) 1.7 - 7.7 K/uL   Lymphocytes Relative 22  12 - 46 %   Lymphs Abs 3.8  0.7 - 4.0 K/uL   Monocytes Relative 8  3 - 12 %   Monocytes Absolute 1.4 (*) 0.1 - 1.0 K/uL   Eosinophils Relative 0  0 - 5 %   Eosinophils Absolute 0.0  0.0 - 0.7 K/uL   Basophils Relative 0  0 - 1 %   Basophils Absolute 0.1  0.0 - 0.1 K/uL  COMPREHENSIVE  METABOLIC PANEL     Status: Abnormal   Collection Time    01/09/13  6:09 PM      Result Value Range   Sodium 133 (*) 135 - 145 mEq/L   Potassium 4.3  3.5 - 5.1 mEq/L   Chloride 98  96 - 112 mEq/L   CO2 24  19 - 32 mEq/L   Glucose, Bld 206 (*) 70 - 99 mg/dL   BUN 17  6  - 23 mg/dL   Creatinine, Ser 4.54  0.50 - 1.10 mg/dL   Calcium 09.8  8.4 - 11.9 mg/dL   Total Protein 8.2  6.0 - 8.3 g/dL   Albumin 3.5  3.5 - 5.2 g/dL   AST 28  0 - 37 U/L   ALT 24  0 - 35 U/L   Alkaline Phosphatase 185 (*) 39 - 117 U/L   Total Bilirubin 0.4  0.3 - 1.2 mg/dL   GFR calc non Af Amer 69 (*) >90 mL/min   GFR calc Af Amer 80 (*) >90 mL/min   Comment:            The eGFR has been calculated     using the CKD EPI equation.     This calculation has not been     validated in all clinical     situations.     eGFR's persistently     <90 mL/min signify     possible Chronic Kidney Disease.   Dg Shoulder Left  01/09/2013  *RADIOLOGY REPORT*  Clinical Data: Fall.  Pain.  LEFT SHOULDER - 2+ VIEW  Comparison: None.  Findings: There is an impacted fracture of the humeral neck.  The views are limited because of patient ability.  There are multiple rib fractures on the left which are old.  IMPRESSION: .  Acute fracture of the neck of the humerus.   Original Report Authenticated By: Paulina Fusi, M.D.     Review of Systems  Constitutional: Positive for malaise/fatigue.  HENT: Negative.   Eyes: Negative.   Respiratory: Negative.   Cardiovascular: Negative.   Gastrointestinal: Negative.   Genitourinary: Negative.   Musculoskeletal: Positive for myalgias, joint pain and falls.  Skin: Negative.   Neurological: Positive for dizziness. Negative for focal weakness and loss of consciousness.  Endo/Heme/Allergies: Negative.   Psychiatric/Behavioral: Negative.     Blood pressure 156/84, pulse 84, temperature 98.3 F (36.8 C), temperature source Oral, resp. rate 18, last menstrual period 12/28/2012, SpO2 100.00%. Physical Exam  Constitutional: She is oriented to person, place, and time. She appears well-developed and well-nourished.  HENT:  Head: Normocephalic and atraumatic.  Eyes: EOM are normal. Pupils are equal, round, and reactive to light.  Neck: Normal range of motion. Neck  supple. No thyromegaly present.  Cardiovascular: Normal rate and regular rhythm.   Respiratory: Effort normal and breath sounds normal.  GI: Soft. Bowel sounds are normal.  Musculoskeletal: She exhibits edema and tenderness.  Left upper arm sling, left lower leg boots  Neurological: She is alert and oriented to person, place, and time.  Skin: Skin is warm and dry.  Psychiatric: She has a normal mood and affect. Her behavior is normal.     Assessment/Plan A 62 year old female status post multiple falls and fractures at home. Patient is unable to walk or perform any functions at this point.  #1 recurrent falls: Patient is failing to thrive at home. We will admit the patient for observation. Get PT and OT consultation, patient will likely need at  least short-term rehabilitation.  #2 left humeral fracture: Patient is currently on a sling. We'll need to consult Dr. Lajoyce Corners in the morning for further guidance. She will continue with PT OT and possibly rehabilitation.  #3 hypertension: We'll continue his home medications and adjust accordingly.  #4 hyperlipidemia: Again continue his home medications.  #5 debility: Child psychotherapist consult for possible placement.   GARBA,LAWAL 01/09/2013, 8:54 PM

## 2013-01-09 NOTE — ED Provider Notes (Addendum)
History     CSN: [id number]  Arrival date & time 01/09/13  1119   First MD Initiated Contact with Patient 01/09/13 1127      Chief Complaint  Patient presents with  . Shoulder Pain    (Consider location/radiation/quality/duration/timing/severity/associated sxs/prior treatment) HPI  Patient has history of left hemiparesis (patient states she was left hand dominant) from stoke who was seen recently on February 28 for a comminuted calcaneal fracture of her left foot. She reports this morning she was trying to get out of bed by herself and get into her wheelchair so she could go to the bathroom to urinate. However she stated she fell to her left side or back and injured her left shoulder. She denies hitting her head or having loss of consciousness. She however was incontinent of urine. She complains of pain in her left shoulder. She does not have any other pain. She relates she has had frequency and urgency without dysuria. She denies nausea, vomiting, or fever. Patient presents via EMS. Pt notes she always falls to the left (left hemiparesis)  PCP  Dr Concepcion Elk Orthopedist Dr. Lajoyce Corners  Past Medical History  Diagnosis Date  . Hypertension   . Diabetes mellitus   . Depression   . Hyperlipidemia   . Bronchitis   . SOB (shortness of breath)   . Arthritis   . Dizziness   . Weakness   . Sore throat   . Sinus problem   . Family history of breast cancer   . Heart attack 2004    uses to see Dr [name]- doesnt see anyone since  . Stroke     left side weaker/ uses a walker, uses most of time.  . Chronic kidney disease     possible stones - confirm with patient  . Renal cancer   . Sleep apnea     study done around 05, hasnt uesd in a whiloe    Past Surgical History  Procedure Laterality Date  . Spleen removal  2001  . Laparoscopic gastric banding  05/09/2008  . Abdominal hysterectomy  2002  . Cholecystectomy  1999  . Nephrectomy  2001    due to cancer  . Carpal tunnel release    .   fracture achilles Left 12/29/2012    achilles calcaneal fracture      Dr Lajoyce Corners  . Achilles tendon surgery Left 12/29/2012    Procedure: ACHILLES TENDON REPAIR;  Surgeon: Nadara Mustard, MD;  Location: [facility] OR;  Service: Orthopedics;  Laterality: Left;  Resection Left Calcaneal fracture and repair Achilles    Family History  Problem Relation Age of Onset  . Diabetes Father   . Alzheimer's disease Father   . Kidney cancer Father   . Hypertension Father   . Kidney disease Father     kidney removal  . Hypertension Mother   . Arthritis Mother   . Stroke Brother   . Diabetes Brother   . Hypertension Brother   . Diabetes Sister   . Thyroid disease Sister   . Hyperlipidemia Sister   . Other Sister     thyroidectomy    History  Substance Use Topics  . Smoking status: Never Smoker   . Smokeless tobacco: Never Used  . Alcohol Use: No  Lives at home Lives with spouse Uses a wheelchair   OB History   Grav Para Term Preterm Abortions TAB SAB Ect Mult Living  Review of Systems  All other systems reviewed and are negative.    Allergies  Iohexol  Home Medications   Current Outpatient Rx  Name  Route  Sig  Dispense  Refill  . atorvastatin (LIPITOR) 20 MG tablet   Oral   Take 20 mg by mouth daily at 6 PM.         . citalopram (CELEXA) 10 MG tablet   Oral   Take 10 mg by mouth daily.         . clopidogrel (PLAVIX) 75 MG tablet   Oral   Take 75 mg by mouth at bedtime.         . hydrochlorothiazide (HYDRODIURIL) 25 MG tablet   Oral   Take 25 mg by mouth daily. For blood pressure.         Marland Kitchen losartan (COZAAR) 100 MG tablet   Oral   Take 100 mg by mouth at bedtime.         . metFORMIN (GLUCOPHAGE) 500 MG tablet   Oral   Take 500 mg by mouth 2 (two) times daily with a meal.         . metoprolol (LOPRESSOR) 50 MG tablet   Oral   Take 50 mg by mouth 2 (two) times daily.          Marland Kitchen oxyCODONE-acetaminophen (ROXICET) 5-325 MG per tablet    Oral   Take 1 tablet by mouth every 4 (four) hours as needed for pain.   60 tablet   0   . venlafaxine XR (EFFEXOR-XR) 150 MG 24 hr capsule   Oral   Take 150 mg by mouth at bedtime.           BP 192/95  Pulse 97  Temp(Src) 98.4 F (36.9 C) (Oral)  Resp 20  SpO2 98%  LMP 12/28/2012  Laboratory interpretation all normal except except hypertension   Physical Exam  Nursing note and vitals reviewed. Constitutional: She is oriented to person, place, and time. She appears well-developed and well-nourished.  Non-toxic appearance. She does not appear ill. No distress.  Obese   HENT:  Head: Normocephalic and atraumatic.  Right Ear: External ear normal.  Left Ear: External ear normal.  Nose: Nose normal. No mucosal edema or rhinorrhea.  Mouth/Throat: Oropharynx is clear and moist and mucous membranes are normal. No dental abscesses or edematous.  Eyes: Conjunctivae and EOM are normal. Pupils are equal, round, and reactive to light.  Neck: Normal range of motion and full passive range of motion without pain. Neck supple.  Cardiovascular: Normal rate, regular rhythm and normal heart sounds.  Exam reveals no gallop and no friction rub.   No murmur heard. Pulmonary/Chest: Effort normal and breath sounds normal. No respiratory distress. She has no wheezes. She has no rhonchi. She has no rales. She exhibits no tenderness and no crepitus.  Abdominal: Soft. Normal appearance and bowel sounds are normal. She exhibits no distension. There is no tenderness. There is no rebound and no guarding.  Musculoskeletal: Normal range of motion. She exhibits no edema and no tenderness.  Patient has a splint on her left lower leg. She is able to flex her knee without difficulty. Patient is only able to wiggle her fingers of her left hand. She has minimal flexion at the left elbow. She has no deformity or tenderness to palpation of her left clavicle. She has some mild diffuse tenderness of her left shoulder.  There is no obvious bruising swelling or deformity seen. Her  right side is normal.  Neurological: She is alert and oriented to person, place, and time. She has normal strength. No cranial nerve deficit.  Patient has a left hemiparesis with left upper extremity weakness more pronounced in the left lower extremity weakness  Skin: Skin is warm, dry and intact. No rash noted. No erythema. No pallor.  Psychiatric: She has a normal mood and affect. Her speech is normal and behavior is normal. Her mood appears not anxious.    ED Course  Procedures (including critical care time)  Medications  HYDROcodone-acetaminophen (NORCO/VICODIN) 5-325 MG per tablet 2 tablet (2 tablets Oral Given 01/09/13 1147)   Pt placed in a sling  When I went in to discuss discharge instructions to the patient and her husband, patient's husband states he can't take care of her anymore. He states he's been missing too much work. She states that she is agreeable to being admitted to a nursing home for rehabilitation. I put a call out to a Child psychotherapist. Shortly after that has been told nursing staff that they just want him home help. So now we are waiting for case manager to assist them.  17:30 Social worker here, states will try to get into rehab facility tomorrow, will need to stay in the hospital overnight for pain control.   19:05 Dr Mikeal Hawthorne, hospitalist, admit to med-surg, observation, team 10   Results for orders placed during the hospital encounter of 01/09/13  URINALYSIS, ROUTINE W REFLEX MICROSCOPIC      Result Value Range   Color, Urine YELLOW  YELLOW   APPearance CLEAR  CLEAR   Specific Gravity, Urine 1.012  1.005 - 1.030   pH 8.0  5.0 - 8.0   Glucose, UA NEGATIVE  NEGATIVE mg/dL   Hgb urine dipstick NEGATIVE  NEGATIVE   Bilirubin Urine NEGATIVE  NEGATIVE   Ketones, ur NEGATIVE  NEGATIVE mg/dL   Protein, ur NEGATIVE  NEGATIVE mg/dL   Urobilinogen, UA 0.2  0.0 - 1.0 mg/dL   Nitrite NEGATIVE  NEGATIVE    Leukocytes, UA NEGATIVE  NEGATIVE  GLUCOSE, CAPILLARY      Result Value Range   Glucose-Capillary 242 (*) 70 - 99 mg/dL    Laboratory interpretation all normal   Dg Shoulder Left  01/09/2013  *RADIOLOGY REPORT*  Clinical Data: Fall.  Pain.  LEFT SHOULDER - 2+ VIEW  Comparison: None.  Findings: There is an impacted fracture of the humeral neck.  The views are limited because of patient ability.  There are multiple rib fractures on the left which are old.  IMPRESSION: .  Acute fracture of the neck of the humerus.   Original Report Authenticated By: Paulina Fusi, M.D.       Dg Ankle Complete Left  12/24/2012  *RADIOLOGY REPORT*  Clinical Data: Fall, posterior ankle pain.  LEFT ANKLE COMPLETE - 3+ VIEW  Comparison: None.  Findings: Mildly comminuted fracture involving the posterior/superior aspect of the calcaneus.  This includes the insertion site of the Achilles tendon, which is likely retracted/thickened.  The ankle mortise is intact.  Mild soft tissue swelling.  IMPRESSION: Mildly comminuted fracture involving the posterior/superior aspect of the calcaneus, as described above.   Original Report Authenticated By: Charline Bills, M.D.       1. Fracture, humerus, neck, left, closed, initial encounter   2. Fall, initial encounter     New Prescriptions   PERCOCET 5-325 MG PER TABLET    Take 1 tablet by mouth every 4 (four) hours as needed  for pain.    Plan admission   Devoria Albe, MD, FACEP   MDM          Ward Givens, MD 01/09/13 1252  Ward Givens, MD 01/09/13 6962

## 2013-01-09 NOTE — ED Notes (Signed)
Dinner tray ordered.

## 2013-01-09 NOTE — ED Notes (Signed)
P.t fell getting out of bed onto her lt. Shoulder. No deformity, or swelling noted. Increased pain with movement.  UTI symptoms noted

## 2013-01-10 ENCOUNTER — Encounter (HOSPITAL_COMMUNITY): Payer: Self-pay | Admitting: *Deleted

## 2013-01-10 DIAGNOSIS — S42309D Unspecified fracture of shaft of humerus, unspecified arm, subsequent encounter for fracture with routine healing: Secondary | ICD-10-CM

## 2013-01-10 DIAGNOSIS — F329 Major depressive disorder, single episode, unspecified: Secondary | ICD-10-CM

## 2013-01-10 DIAGNOSIS — E119 Type 2 diabetes mellitus without complications: Secondary | ICD-10-CM

## 2013-01-10 DIAGNOSIS — J40 Bronchitis, not specified as acute or chronic: Secondary | ICD-10-CM

## 2013-01-10 DIAGNOSIS — W19XXXA Unspecified fall, initial encounter: Secondary | ICD-10-CM

## 2013-01-10 LAB — BASIC METABOLIC PANEL
Calcium: 10.2 mg/dL (ref 8.4–10.5)
GFR calc Af Amer: 83 mL/min — ABNORMAL LOW (ref >90)
GFR calc non Af Amer: 72 mL/min — ABNORMAL LOW (ref >90)
Potassium: 4.4 mEq/L (ref 3.5–5.1)
Sodium: 136 mEq/L (ref 135–145)

## 2013-01-10 LAB — CBC
Hemoglobin: 12.3 g/dL (ref 12.0–15.0)
Platelets: 328 10*3/uL (ref 150–400)
RBC: 4.31 MIL/uL (ref 3.87–5.11)
WBC: 13.7 10*3/uL — ABNORMAL HIGH (ref 4.0–10.5)

## 2013-01-10 LAB — GLUCOSE, CAPILLARY: Glucose-Capillary: 172 mg/dL — ABNORMAL HIGH (ref 70–99)

## 2013-01-10 MED ORDER — INSULIN ASPART 100 UNIT/ML ~~LOC~~ SOLN
0.0000 [IU] | Freq: Three times a day (TID) | SUBCUTANEOUS | Status: DC
  Administered 2013-01-10 – 2013-01-11 (×3): 2 [IU] via SUBCUTANEOUS
  Administered 2013-01-11: 5 [IU] via SUBCUTANEOUS
  Administered 2013-01-12: 2 [IU] via SUBCUTANEOUS
  Administered 2013-01-12: 5 [IU] via SUBCUTANEOUS
  Administered 2013-01-12: 2 [IU] via SUBCUTANEOUS
  Administered 2013-01-13 (×2): 3 [IU] via SUBCUTANEOUS

## 2013-01-10 MED ORDER — INSULIN ASPART 100 UNIT/ML ~~LOC~~ SOLN
0.0000 [IU] | Freq: Every day | SUBCUTANEOUS | Status: DC
Start: 2013-01-10 — End: 2013-01-13
  Administered 2013-01-12: 2 [IU] via SUBCUTANEOUS

## 2013-01-10 NOTE — Progress Notes (Signed)
Inpatient Diabetes Program Recommendations  AACE/ADA: New Consensus Statement on Inpatient Glycemic Control (2013)  Target Ranges:  Prepandial:   less than 140 mg/dL      Peak postprandial:   less than 180 mg/dL (1-2 hours)      Critically ill patients:  140 - 180 mg/dL   Reason for Assessment: Has diabetes but CBG order for q am  Note:  The American Diabetes Association recommends that patients with diabetes have CBG's checked at least four times a day while in the hospital setting.  Please order CBG's ac & hs while eating or if patient becomes NPO, every 4 hours. Thank you.  Meilah Delrosario S. Elsie Lincoln, RN, CNS, CDE Inpatient Diabetes Program, team pager [phone]

## 2013-01-10 NOTE — Consult Note (Signed)
Reason for Consult: Left proximal humerus fracture Referring Physician: Rae Lips is an 62 y.o. female.  HPI: Patient is a 62 year old woman who presents status post reduction internal fixation of a Achilles rupture and calcaneus fracture. Patient was discharged to home. Physical therapy was sent out. to her home and patient refused physical therapy. Patient was getting up to go the bathroom from her bed fell sustained a left proximal humerus fracture.  Past Medical History  Diagnosis Date  . Hypertension   . Diabetes mellitus   . Depression   . Hyperlipidemia   . Bronchitis   . SOB (shortness of breath)   . Arthritis   . Dizziness   . Weakness   . Sore throat   . Sinus problem   . Family history of breast cancer   . Heart attack 2004    uses to see Dr [name]- doesnt see anyone since  . Stroke     left side weaker/ uses a walker, uses most of time.  . Chronic kidney disease     possible stones - confirm with patient  . Renal cancer   . Sleep apnea     study done around 05, hasnt uesd in a whiloe    Past Surgical History  Procedure Laterality Date  . Spleen removal  2001  . Laparoscopic gastric banding  05/09/2008  . Abdominal hysterectomy  2002  . Cholecystectomy  1999  . Nephrectomy  2001    due to cancer  . Carpal tunnel release    .  fracture achilles Left 12/29/2012    achilles calcaneal fracture      Dr Lajoyce Corners  . Achilles tendon surgery Left 12/29/2012    Procedure: ACHILLES TENDON REPAIR;  Surgeon: Nadara Mustard, MD;  Location: [facility] OR;  Service: Orthopedics;  Laterality: Left;  Resection Left Calcaneal fracture and repair Achilles    Family History  Problem Relation Age of Onset  . Diabetes Father   . Alzheimer's disease Father   . Kidney cancer Father   . Hypertension Father   . Kidney disease Father     kidney removal  . Hypertension Mother   . Arthritis Mother   . Stroke Brother   . Diabetes Brother   . Hypertension Brother   . Diabetes  Sister   . Thyroid disease Sister   . Hyperlipidemia Sister   . Other Sister     thyroidectomy    Social History:  reports that she has never smoked. She has never used smokeless tobacco. She reports that she does not drink alcohol or use illicit drugs.  Allergies:  Allergies  Allergen Reactions  . Iohexol      Code: HIVES, Desc: Pt states in 2005 w/ a heart cath, she broke out in large hives and a rash.  She was given 50mg  benadryl, po.  She tolerated the procedure well w/o complication.  Thanks., Onset Date: [id number]     Medications: I have reviewed the patient's current medications.  Results for orders placed during the hospital encounter of 01/09/13 (from the past 48 hour(s))  URINALYSIS, ROUTINE W REFLEX MICROSCOPIC     Status: None   Collection Time    01/09/13 11:51 AM      Result Value Range   Color, Urine YELLOW  YELLOW   APPearance CLEAR  CLEAR   Specific Gravity, Urine 1.012  1.005 - 1.030   pH 8.0  5.0 - 8.0   Glucose, UA NEGATIVE  NEGATIVE mg/dL  Hgb urine dipstick NEGATIVE  NEGATIVE   Bilirubin Urine NEGATIVE  NEGATIVE   Ketones, ur NEGATIVE  NEGATIVE mg/dL   Protein, ur NEGATIVE  NEGATIVE mg/dL   Urobilinogen, UA 0.2  0.0 - 1.0 mg/dL   Nitrite NEGATIVE  NEGATIVE   Leukocytes, UA NEGATIVE  NEGATIVE   Comment: MICROSCOPIC NOT DONE ON URINES WITH NEGATIVE PROTEIN, BLOOD, LEUKOCYTES, NITRITE, OR GLUCOSE <1000 mg/dL.  GLUCOSE, CAPILLARY     Status: Abnormal   Collection Time    01/09/13 12:01 PM      Result Value Range   Glucose-Capillary 242 (*) 70 - 99 mg/dL  CBC WITH DIFFERENTIAL     Status: Abnormal   Collection Time    01/09/13  6:09 PM      Result Value Range   WBC 17.2 (*) 4.0 - 10.5 K/uL   RBC 4.26  3.87 - 5.11 MIL/uL   Hemoglobin 12.7  12.0 - 15.0 g/dL   HCT 78.2  95.6 - 21.3 %   MCV 88.0  78.0 - 100.0 fL   MCH 29.8  26.0 - 34.0 pg   MCHC 33.9  30.0 - 36.0 g/dL   RDW 08.6  57.8 - 46.9 %   Platelets 365  150 - 400 K/uL   Neutrophils Relative  69  43 - 77 %   Neutro Abs 11.9 (*) 1.7 - 7.7 K/uL   Lymphocytes Relative 22  12 - 46 %   Lymphs Abs 3.8  0.7 - 4.0 K/uL   Monocytes Relative 8  3 - 12 %   Monocytes Absolute 1.4 (*) 0.1 - 1.0 K/uL   Eosinophils Relative 0  0 - 5 %   Eosinophils Absolute 0.0  0.0 - 0.7 K/uL   Basophils Relative 0  0 - 1 %   Basophils Absolute 0.1  0.0 - 0.1 K/uL  COMPREHENSIVE METABOLIC PANEL     Status: Abnormal   Collection Time    01/09/13  6:09 PM      Result Value Range   Sodium 133 (*) 135 - 145 mEq/L   Potassium 4.3  3.5 - 5.1 mEq/L   Chloride 98  96 - 112 mEq/L   CO2 24  19 - 32 mEq/L   Glucose, Bld 206 (*) 70 - 99 mg/dL   BUN 17  6 - 23 mg/dL   Creatinine, Ser 6.29  0.50 - 1.10 mg/dL   Calcium 52.8  8.4 - 41.3 mg/dL   Total Protein 8.2  6.0 - 8.3 g/dL   Albumin 3.5  3.5 - 5.2 g/dL   AST 28  0 - 37 U/L   ALT 24  0 - 35 U/L   Alkaline Phosphatase 185 (*) 39 - 117 U/L   Total Bilirubin 0.4  0.3 - 1.2 mg/dL   GFR calc non Af Amer 69 (*) >90 mL/min   GFR calc Af Amer 80 (*) >90 mL/min   Comment:            The eGFR has been calculated     using the CKD EPI equation.     This calculation has not been     validated in all clinical     situations.     eGFR's persistently     <90 mL/min signify     possible Chronic Kidney Disease.  CBC     Status: Abnormal   Collection Time    01/09/13  9:53 PM      Result Value Range   WBC 16.5 (*)  4.0 - 10.5 K/uL   RBC 4.22  3.87 - 5.11 MIL/uL   Hemoglobin 12.6  12.0 - 15.0 g/dL   HCT 14.7  82.9 - 56.2 %   MCV 87.0  78.0 - 100.0 fL   MCH 29.9  26.0 - 34.0 pg   MCHC 34.3  30.0 - 36.0 g/dL   RDW 13.0  86.5 - 78.4 %   Platelets 330  150 - 400 K/uL  CREATININE, SERUM     Status: Abnormal   Collection Time    01/09/13  9:53 PM      Result Value Range   Creatinine, Ser 0.88  0.50 - 1.10 mg/dL   GFR calc non Af Amer 71 (*) >90 mL/min   GFR calc Af Amer 82 (*) >90 mL/min   Comment:            The eGFR has been calculated     using the CKD EPI  equation.     This calculation has not been     validated in all clinical     situations.     eGFR's persistently     <90 mL/min signify     possible Chronic Kidney Disease.  GLUCOSE, CAPILLARY     Status: Abnormal   Collection Time    01/09/13 10:10 PM      Result Value Range   Glucose-Capillary 143 (*) 70 - 99 mg/dL  BASIC METABOLIC PANEL     Status: Abnormal   Collection Time    01/10/13  6:55 AM      Result Value Range   Sodium 136  135 - 145 mEq/L   Potassium 4.4  3.5 - 5.1 mEq/L   Chloride 100  96 - 112 mEq/L   CO2 25  19 - 32 mEq/L   Glucose, Bld 180 (*) 70 - 99 mg/dL   BUN 14  6 - 23 mg/dL   Creatinine, Ser 6.96  0.50 - 1.10 mg/dL   Calcium 29.5  8.4 - 28.4 mg/dL   GFR calc non Af Amer 72 (*) >90 mL/min   GFR calc Af Amer 83 (*) >90 mL/min   Comment:            The eGFR has been calculated     using the CKD EPI equation.     This calculation has not been     validated in all clinical     situations.     eGFR's persistently     <90 mL/min signify     possible Chronic Kidney Disease.  CBC     Status: Abnormal   Collection Time    01/10/13  6:55 AM      Result Value Range   WBC 13.7 (*) 4.0 - 10.5 K/uL   RBC 4.31  3.87 - 5.11 MIL/uL   Hemoglobin 12.3  12.0 - 15.0 g/dL   HCT 13.2  44.0 - 10.2 %   MCV 87.0  78.0 - 100.0 fL   MCH 28.5  26.0 - 34.0 pg   MCHC 32.8  30.0 - 36.0 g/dL   RDW 72.5  36.6 - 44.0 %   Platelets 328  150 - 400 K/uL  GLUCOSE, CAPILLARY     Status: Abnormal   Collection Time    01/10/13  7:51 AM      Result Value Range   Glucose-Capillary 175 (*) 70 - 99 mg/dL   Comment 1 Documented in Chart     Comment 2 Notify RN  GLUCOSE, CAPILLARY     Status: Abnormal   Collection Time    01/10/13  4:21 PM      Result Value Range   Glucose-Capillary 135 (*) 70 - 99 mg/dL   Comment 1 Notify RN     Comment 2 Documented in Chart    MAGNESIUM     Status: None   Collection Time    01/10/13  4:37 PM      Result Value Range   Magnesium 1.7  1.5 -  2.5 mg/dL    Dg Shoulder Left  1/61/0960  *RADIOLOGY REPORT*  Clinical Data: Fall.  Pain.  LEFT SHOULDER - 2+ VIEW  Comparison: None.  Findings: There is an impacted fracture of the humeral neck.  The views are limited because of patient ability.  There are multiple rib fractures on the left which are old.  IMPRESSION: .  Acute fracture of the neck of the humerus.   Original Report Authenticated By: Paulina Fusi, M.D.     Review of Systems  All other systems reviewed and are negative.   Blood pressure 136/88, pulse 80, temperature 98.4 F (36.9 C), temperature source Oral, resp. rate 18, height 5' (1.524 m), weight 90.084 kg (198 lb 9.6 oz), last menstrual period 12/28/2012, SpO2 100.00%. Physical Exam On examination patient's left upper extremity is neurovascularly intact. Review of the radiographs shows an impacted left proximal humerus fracture. Assessment/Plan: Assessment: Impacted left proximal humerus fracture.  Plan: Continue sling for left upper extremity. No surgical intervention is necessary. She will be strict nonweightbearing to the left upper extremity. She may be weightbearing as tolerated for transfers on the left lower extremity. She is to wear the boot at all times on the left foot. Patient will require discharge to short-term skilled nursing facility.  DUDA,[name] 01/10/2013, 6:15 PM

## 2013-01-10 NOTE — Clinical Social Work Psychosocial (Signed)
Clinical Social Work Department  BRIEF PSYCHOSOCIAL ASSESSMENT  Patient: [name]  Account Number: [id number]  Admit date: 01/09/13 Clinical Social Worker Sabino Niemann, MSW Date/Time: 01/10/2013 12:00 PM Referred by: Physician Date Referred: 11/12/12 Referred for   SNF Placement   Other Referral:  Interview type: Patient  Other interview type:PSYCHOSOCIAL DATA  Living Status: Spouse Admitted from facility:  Level of care:  Primary support name: Darnold,Theldelroe M Primary support relationship to patient: Husband Degree of support available:  Strong and vested  CURRENT CONCERNS  Current Concerns   Post-Acute Placement   Other Concerns:  SOCIAL WORK ASSESSMENT / PLAN  CSW met with pt re: PT recommendation for SNF.   Pt lives with her spouse and feels that he won't be able to provide the necessary care  CSW explained placement process and answered questions.   Pt reports no facility as her preference     CSW completed FL2 and initiatedSNF search.     Assessment/plan status: Information/Referral to [facility]  Other assessment/ plan:  Information/referral to community resources:  SNF   PTAR   PATIENT'S/FAMILY'S RESPONSE TO PLAN OF CARE:  Pt  reports she is agreeable to [facility] in order to increase strength and independence with mobility prior to returning home  Pt verbalized understanding of placement process and appreciation for CSW assist.   Sabino Niemann, MSW [phone]

## 2013-01-10 NOTE — Progress Notes (Signed)
Patient ID: UNITY LUEPKE  female  ZOX:[id number]    DOB: 09-18-1951    DOA: 01/09/2013  PCP: [name], MD  Assessment/Plan: Principal Problem:   Falls with acute left humeral fracture - Status post multiple falls and fractures, will check vitamin D levels, calcium, mag level - Orthopedics consult called, Dr Audrie Lia office - Currently has left arm sling  - PT and OT evaluation when medically stable, may need rehabilitation  Active Problems:   HTN (hypertension): Continue home medications, currently stable    HLD (hyperlipidemia) - Continue statins    Diabetes mellitus -Obtain hemoglobin A1c, place on sliding scale insulin while inpatient, DC metformin   DVT Prophylaxis:Lovenox  Code Status:  Disposition:    Subjective: Left arm in sling, pain controlled  Objective: Weight change:   Intake/Output Summary (Last 24 hours) at 01/10/13 1445 Last data filed at 01/10/13 0600  Gross per 24 hour  Intake   1050 ml  Output      0 ml  Net   1050 ml   Blood pressure 136/88, pulse 80, temperature 98.4 F (36.9 C), temperature source Oral, resp. rate 18, height 5' (1.524 m), weight 90.084 kg (198 lb 9.6 oz), last menstrual period 12/28/2012, SpO2 100.00%.  Physical Exam: General: Alert and awake, oriented x3, not in any acute distress. CVS: S1-S2 clear, no murmur rubs or gallops Chest: clear to auscultation bilaterally, no wheezing, rales or rhonchi Abdomen: soft nontender, nondistended, normal bowel sounds Extremities: no cyanosis, clubbing, left arm in sling. Left leg in the boot   Lab Results: Basic Metabolic Panel:  Recent Labs Lab 01/09/13 1809 01/09/13 2153 01/10/13 0655  NA 133*  --  136  K 4.3  --  4.4  CL 98  --  100  CO2 24  --  25  GLUCOSE 206*  --  180*  BUN 17  --  14  CREATININE 0.90 0.88 0.87  CALCIUM 10.1  --  10.2   Liver Function Tests:  Recent Labs Lab 01/09/13 1809  AST 28  ALT 24  ALKPHOS 185*  BILITOT 0.4  PROT 8.2   ALBUMIN 3.5  CBC:  Recent Labs Lab 01/09/13 1809 01/09/13 2153 01/10/13 0655  WBC 17.2* 16.5* 13.7*  NEUTROABS 11.9*  --   --   HGB 12.7 12.6 12.3  HCT 37.5 36.7 37.5  MCV 88.0 87.0 87.0  PLT 365 330 328     Recent Labs Lab 01/09/13 1201 01/09/13 2210 01/10/13 0751  GLUCAP 242* 143* 175*     Micro Results: No results found for this or any previous visit (from the past 240 hour(s)).  Studies/Results: Dg Ankle Complete Left  12/24/2012  *RADIOLOGY REPORT*  Clinical Data: Fall, posterior ankle pain.  LEFT ANKLE COMPLETE - 3+ VIEW  Comparison: None.  Findings: Mildly comminuted fracture involving the posterior/superior aspect of the calcaneus.  This includes the insertion site of the Achilles tendon, which is likely retracted/thickened.  The ankle mortise is intact.  Mild soft tissue swelling.  IMPRESSION: Mildly comminuted fracture involving the posterior/superior aspect of the calcaneus, as described above.   Original Report Authenticated By: Charline Bills, M.D.    Dg Shoulder Left  01/09/2013  *RADIOLOGY REPORT*  Clinical Data: Fall.  Pain.  LEFT SHOULDER - 2+ VIEW  Comparison: None.  Findings: There is an impacted fracture of the humeral neck.  The views are limited because of patient ability.  There are multiple rib fractures on the left which are old.  IMPRESSION: .  Acute fracture of the neck of the humerus.   Original Report Authenticated By: Paulina Fusi, M.D.     Medications: Scheduled Meds: . atorvastatin  20 mg Oral q1800  . citalopram  10 mg Oral Daily  . clopidogrel  75 mg Oral QHS  . enoxaparin (LOVENOX) injection  40 mg Subcutaneous Q24H  . hydrochlorothiazide  25 mg Oral Daily  . losartan  100 mg Oral QHS  . metFORMIN  500 mg Oral BID WC  . metoprolol  50 mg Oral BID  . venlafaxine XR  150 mg Oral QHS      LOS: 1 day   Bleu Minerd M.D. [facility] 01/10/2013, 2:45 PM Pager: [phone]  If 7PM-7AM, please contact  night-coverage [email or web address] Password [email or web address]

## 2013-01-10 NOTE — Evaluation (Signed)
Occupational Therapy Evaluation Patient Details Name: [name] MRN: [id number] DOB: [birth year] Today's Date: 01/10/2013 Time: 4098-1191 OT Time Calculation (min): 27 min  OT Assessment / Plan / Recommendation Clinical Impression   This 62 y.o. Female with history of CVA and history of frequent falls admitted after falling at home sustaining Lt. Humeral fracture.  Pt. With recent fall on 12/30/12 resulting in Lt. Calcaneus fracture with ORIF.  Pt currently in camboot - needs WBing status clarified for Lt. LE as orders on 12/30/12 state WBAT, but "limit weightbearing Lt. LE" in comments.  Pt will benefit from OT for the below listed deficits,  to maximize safety and independence with BADLs to allow pt to return home with spouse at Min A level after SNF level rehab.  Spoke with pt re: need for rehab and she is agreeable; however, pt noted with memory deficits so unsure that she will recall conversation.      OT Assessment  Patient needs continued OT Services    Follow Up Recommendations  SNF;Supervision/Assistance - 24 hour    Barriers to Discharge Decreased caregiver support    Equipment Recommendations  None recommended by OT    Recommendations for Other Services    Frequency  Min 2X/week    Precautions / Restrictions Precautions Precautions: Fall Required Braces or Orthoses: Other Brace/Splint Other Brace/Splint: camboot on L LE ; sling Lt. UE Restrictions Weight Bearing Restrictions: Yes LUE Weight Bearing: Non weight bearing LLE Weight Bearing: Non weight bearing (Order on 12/30/12 WBAT, "limit weighbearing" in comments)       ADL  Eating/Feeding: Set up Where Assessed - Eating/Feeding: Bed level;Chair Grooming: Wash/dry hands;Wash/dry face;Teeth care;Minimal assistance Where Assessed - Grooming: Unsupported sitting Upper Body Bathing: Moderate assistance Where Assessed - Upper Body Bathing: Unsupported sitting Lower Body Bathing: Maximal assistance Where Assessed -  Lower Body Bathing: Supported sit to stand Upper Body Dressing: Moderate assistance Where Assessed - Upper Body Dressing: Unsupported sitting Lower Body Dressing: +1 Total assistance Where Assessed - Lower Body Dressing: Supported sit to Pharmacist, hospital: Moderate assistance Toilet Transfer Method: Stand pivot Toilet Transfer Equipment: Bedside commode Toileting - Clothing Manipulation and Hygiene: +1 Total assistance Where Assessed - Toileting Clothing Manipulation and Hygiene: Standing Equipment Used:  (camboot; Lt. UE sling) Transfers/Ambulation Related to ADLs: Sit to stand with min A.  Pt unable to maintain NWB Lt. LE ADL Comments: Pt. unable to access feet.  requires assist for all aspects of ADLs due to Lt. UE immobilized.  Ortho MD has not yet seen pt to clarify activity level with Lt UE, and WBing status for Lt LE needs to be clarified    OT Diagnosis: Generalized weakness;Cognitive deficits;Acute pain  OT Problem List: Decreased strength;Decreased activity tolerance;Impaired balance (sitting and/or standing);Impaired vision/perception;Decreased cognition;Decreased knowledge of use of DME or AE;Decreased knowledge of precautions;Obesity;Pain;Impaired UE functional use OT Treatment Interventions: Self-care/ADL training;DME and/or AE instruction;Therapeutic activities;Patient/family education;Balance training   OT Goals Acute Rehab OT Goals OT Goal Formulation: With patient Time For Goal Achievement: 01/17/13 Potential to Achieve Goals: Good ADL Goals Pt Will Perform Lower Body Bathing: with min assist;Sit to stand from chair;Sit to stand from bed ADL Goal: Lower Body Bathing - Progress: Goal set today Pt Will Perform Lower Body Dressing: with mod assist;Sit to stand from chair;Sit to stand from bed;with adaptive equipment ADL Goal: Lower Body Dressing - Progress: Goal set today Pt Will Transfer to Toilet: with min assist;Squat pivot transfer;Stand pivot transfer;with DME ADL  Goal: Toilet Transfer -  Progress: Goal set today Pt Will Perform Toileting - Clothing Manipulation: Standing;with mod assist ADL Goal: Toileting - Clothing Manipulation - Progress: Goal set today  Visit Information  Last OT Received On: 01/10/13 Assistance Needed: +1    Subjective Data  Subjective: "I'm afraid I might fall" Patient Stated Goal: To stop falling   Prior Functioning     Home Living Lives With: Spouse Available Help at Discharge: Family;Available PRN/intermittently Type of Home: House Home Access: Ramped entrance Home Layout: Two level;1/2 bath on main level Bathroom Shower/Tub: Engineer, manufacturing systems: Standard Bathroom Accessibility: Yes How Accessible: Accessible via wheelchair Home Adaptive Equipment: Walker - rolling;Wheelchair - manual;Other (comment) Additional Comments: reports multiple recent falls when home alone.  Prior Function Level of Independence: Needs assistance Needs Assistance: Bathing;Dressing;Meal Prep;Light Housekeeping Bath: Minimal Dressing: Minimal Meal Prep: Minimal Light Housekeeping: Moderate Able to Take Stairs?: No Driving: Yes Communication Communication: No difficulties Dominant Hand: Left         Vision/Perception Vision - History Visual History:  (h/o CVA) Patient Visual Report: No change from baseline Vision - Assessment Eye Alignment: Within Functional Limits Vision Assessment: Vision tested Ocular Range of Motion: Within Functional Limits Tracking/Visual Pursuits: Other (comment) (loses fixation frequently and has to initiate a saccade. ) Visual Fields: No apparent deficits Additional Comments: Pt with impaired visual attention Perception Perception: Within Functional Limits Praxis Praxis: Intact   Cognition  Cognition Overall Cognitive Status: Impaired Area of Impairment: Memory;Safety/judgement;Attention Arousal/Alertness: Awake/alert Orientation Level: Appears intact for tasks  assessed Behavior During Session: Anxious Current Attention Level: Sustained (With min cues) Attention - Other Comments: Pt self distracts with questioning about PLOF, and has to frequently be re-directed Memory Deficits: Pt with difficulty recalling events of last several months, and noted to frequently provided contradictory info.  Pt. required info to be repeated as she had not retained it. Cognition - Other Comments: Anticipate cognitive deficts are pt baseline from CVA    Extremity/Trunk Assessment Right Upper Extremity Assessment RUE ROM/Strength/Tone: WFL for tasks assessed RUE Sensation: WFL - Light Touch RUE Coordination: WFL - gross/fine motor Left Upper Extremity Assessment LUE ROM/Strength/Tone: Deficits LUE ROM/Strength/Tone Deficits: Lt. UE in sling due to humeral fracture.  Activity level not yet clarified as ortho has not yet seen pt LUE Sensation: WFL - Light Touch Right Lower Extremity Assessment RLE ROM/Strength/Tone: WFL for tasks assessed RLE Sensation: WFL - Light Touch Left Lower Extremity Assessment LLE ROM/Strength/Tone: Deficits;Unable to fully assess;Due to precautions LLE ROM/Strength/Tone Deficits: pt in camboot Trunk Assessment Trunk Assessment: Normal     Mobility Bed Mobility Bed Mobility: Supine to Sit;Sitting - Scoot to Edge of Bed;Sit to Supine Supine to Sit: 4: Min assist;With rails;HOB elevated Sitting - Scoot to Edge of Bed: 4: Min guard;With rail Sit to Supine: 4: Min assist;With rail;HOB flat Details for Bed Mobility Assistance: required cues for hand placement and sequencing; max encouragement to participate. Transfers Transfers: Sit to Stand;Stand to Sit Sit to Stand: 4: Min assist;With upper extremity assist;From bed Stand to Sit: 4: Min assist;With upper extremity assist;To bed Details for Transfer Assistance: Pt fearful     Exercise     Balance Balance Balance Assessed: Yes Static Standing Balance Static Standing - Balance  Support: Right upper extremity supported Static Standing - Level of Assistance: 3: Mod assist   End of Session OT - End of Session Activity Tolerance: Patient tolerated treatment well Patient left: in bed;with call bell/phone within reach;with bed alarm set  GO Functional Limitation: Self care Self  Care Current Status [phone]): At least 60 percent but less than 80 percent impaired, limited or restricted Self Care Goal Status (U0454): At least 40 percent but less than 60 percent impaired, limited or restricted   Brooks Stotz M 01/10/2013, 12:20 PM

## 2013-01-10 NOTE — Clinical Social Work Placement (Addendum)
Clinical Social Work Department  CLINICAL SOCIAL WORK PLACEMENT NOTE  01/10/2013 Patient:[name] Account Number: [id number] Admit date: 09/29/2012  Clinical Social Worker: Sabino Niemann, MSW Date/time: 01/10/2013 11:30 AM  Clinical Social Work is seeking post-discharge placement for this patient at the following level of care: SKILLED NURSING (*CSW will update this form in Epic as items are completed)  01/10/2013 Patient/family provided with [facility] Department of Clinical Social Work's list of facilities offering this level of care within the geographic area requested by the patient (or if unable, by the patient's family).  01/10/2013  Patient/family informed of their freedom to choose among providers that offer the needed level of care, that participate in Medicare, Medicaid or managed care program needed by the patient, have an available bed and are willing to accept the patient.  01/10/2013  Patient/family informed of [facility]' ownership interest in [facility], as well as of the fact that they are under no obligation to receive care at this facility.  PASARR submitted to EDS on 01/10/2013   PASARR number received from EDS on 01/10/2013   FL2 transmitted to all facilities in geographic area requested by pt/family on   FL2 transmitted to all facilities within larger geographic area on  Patient informed that his/her managed care company has contracts with or will negotiate with certain facilities, including the following:  Patient/family informed of bed offers received: 01/11/13 Patient chooses bed at [facility] star home Physician recommends and patient chooses bed at  Patient to be transferred to on 01/13/2013 Patient to be transferred to facility by [facility] The following physician request were entered in Epic:  Additional Comments: Patient reports that [facility] PPO Out of State is her primary insurance and [employer] Medicare is her secondary. This was confirmed by financial  counseling.  Sabino Niemann, MSW,  [phone] Signing off

## 2013-01-10 NOTE — Care Management Note (Signed)
CARE MANAGEMENT NOTE 01/10/2013  Patient:  [name], [name]   Account Number:  [id number]  Date Initiated:  01/10/2013  Documentation initiated by:  Vance Peper  Subjective/Objective Assessment:   62 yr old female admitted with left humerus fracture.  Patient recently had left calcaneous fracture.     Action/Plan:   Patient will need shortterm rehab at [facility]. Social Worker is aware.   Anticipated DC Date:  01/11/2013   Anticipated DC Plan:  SKILLED NURSING FACILITY  In-house referral  Clinical Social Worker      DC Planning Services  CM consult      Choice offered to / List presented to:             Status of service:  Completed, signed off Medicare Important Message given?   (If response is "NO", the following Medicare IM given date fields will be blank) Date Medicare IM given:   Date Additional Medicare IM given:    Discharge Disposition:  SKILLED NURSING FACILITY  Per UR Regulation:    If discussed at Long Length of Stay Meetings, dates discussed:    Comments:

## 2013-01-10 NOTE — Evaluation (Signed)
Physical Therapy Evaluation Patient Details Name: [name] MRN: [id number] DOB: [birth year] Today's Date: 01/10/2013 Time: 4098-1191 PT Time Calculation (min): 29 min  PT Assessment / Plan / Recommendation Clinical Impression  Pt is a 62 y.o female s/p recent fall resulting to L humerus fx. Pt has had recent hx of multiple falls. Pt is alone 12 hrs a day when husband has to walk. She states she has been using w/c for mobility recently secondary to falls. Pt would benefit from SNF upon acute D/C to continue therapy. Pt very insistent on not wanting to go home alone. Very agreeable to short-term therapy to improve balance and functional mobility.      PT Assessment  Patient needs continued PT services    Follow Up Recommendations  Supervision/Assistance - 24 hour;SNF    Does the patient have the potential to tolerate intense rehabilitation      Barriers to Discharge        Equipment Recommendations  None recommended by PT    Recommendations for Other Services OT consult   Frequency Min 5X/week    Precautions / Restrictions Precautions Precautions: Fall Other Brace/Splint: camboot on L LE    Pertinent Vitals/Pain 8/10 with therapy. Nursing present to medicate.      Mobility  Bed Mobility Bed Mobility: Supine to Sit;Sitting - Scoot to Edge of Bed Supine to Sit: 4: Min assist;With rails;HOB elevated;3: Mod assist Sitting - Scoot to Edge of Bed: 4: Min assist;With rail Details for Bed Mobility Assistance: required cues for hand placement and sequencing; max encouragement to participate. Transfers Transfers: Sit to Stand;Stand to [employer] Transfers Sit to Stand: 4: Min assist;From bed Stand to Sit: 4: Min assist;To chair/3-in-1;With armrests Stand Pivot Transfers: 3: Mod assist Details for Transfer Assistance: cues for hand placement and sequencing. pt demo high anxiety when performing transfers and increased fear of falling. did not use RW secondary to L UE fx.  Used tactile cues to facilitate weightshift and transfer.  Ambulation/Gait Ambulation/Gait Assistance: Not tested (comment) Stairs: No Wheelchair Mobility Wheelchair Mobility: No    Exercises     PT Diagnosis: Difficulty walking  PT Problem List: Decreased strength;Decreased range of motion;Decreased activity tolerance;Decreased balance;Decreased coordination;Decreased knowledge of use of DME;Decreased safety awareness;Decreased knowledge of precautions;Decreased cognition;Decreased mobility;Pain PT Treatment Interventions: DME instruction;Gait training;Functional mobility training;Therapeutic activities;Therapeutic exercise;Balance training;Neuromuscular re-education;Patient/family education   PT Goals Acute Rehab PT Goals PT Goal Formulation: With patient Time For Goal Achievement: 01/17/13 Potential to Achieve Goals: Good Pt will go Supine/Side to Sit: with modified independence PT Goal: Supine/Side to Sit - Progress: Goal set today Pt will go Sit to Supine/Side: with modified independence PT Goal: Sit to Supine/Side - Progress: Goal set today Pt will go Sit to Stand: with supervision PT Goal: Sit to Stand - Progress: Goal set today Pt will go Stand to Sit: with supervision PT Goal: Stand to Sit - Progress: Goal set today Pt will Transfer Bed to Chair/Chair to Bed: with supervision PT Transfer Goal: Bed to Chair/Chair to Bed - Progress: Goal set today Pt will Ambulate: 51 - 150 feet;with supervision;with least restrictive assistive device PT Goal: Ambulate - Progress: Goal set today  Visit Information  Last PT Received On: 01/10/13 Assistance Needed: +1    Subjective Data  Subjective: I fall a lot. I dont think i can get to the chair. Patient Stated Goal: to not fall   Prior Functioning  Home Living Lives With: Spouse Available Help at Discharge: Family;Available PRN/intermittently Type of  Home: House Home Access: Ramped entrance Bathroom Shower/Tub: Teacher, music: Standard Home Adaptive Equipment: Walker - rolling;Wheelchair - manual;Other (comment) Additional Comments: reports multiple recent falls when home alone.  Prior Function Level of Independence: Needs assistance Needs Assistance: Bathing;Dressing;Meal Prep;Light Housekeeping Bath: Minimal Dressing: Minimal Meal Prep: Minimal Light Housekeeping: Moderate Able to Take Stairs?: No Communication Communication: No difficulties    Cognition  Cognition Overall Cognitive Status: Appears within functional limits for tasks assessed/performed Arousal/Alertness: Awake/alert Orientation Level: Appears intact for tasks assessed Behavior During Session: Anxious Cognition - Other Comments: requires redirection; pt very fearful of falling; repeated how she would not go home alone again multiple times.    Extremity/Trunk Assessment Left Upper Extremity Assessment LUE ROM/Strength/Tone: Deficits;Due to pain LUE ROM/Strength/Tone Deficits: decreased secondary to humerus fx  LUE Sensation: WFL - Light Touch Right Lower Extremity Assessment RLE ROM/Strength/Tone: WFL for tasks assessed RLE Sensation: WFL - Light Touch Left Lower Extremity Assessment LLE ROM/Strength/Tone: Deficits;Unable to fully assess;Due to precautions LLE ROM/Strength/Tone Deficits: pt in camboot   Balance Balance Balance Assessed: Yes Static Standing Balance Static Standing - Balance Support: Right upper extremity supported Static Standing - Level of Assistance: 3: Mod assist  End of Session PT - End of Session Equipment Utilized During Treatment: Gait belt (Camboot for L LE. ) Activity Tolerance: Patient tolerated treatment well Patient left: in chair;with call bell/phone within reach;with nursing in room Nurse Communication: Mobility status  GP Functional Assessment Tool Used: clinical judgement Functional Limitation: Mobility: Walking and moving around Mobility: Walking and Moving Around Current  Status (Z6109): At least 20 percent but less than 40 percent impaired, limited or restricted Mobility: Walking and Moving Around Goal Status [phone]): At least 1 percent but less than 20 percent impaired, limited or restricted   Donell Sievert, [place] [phone] 01/10/2013, 11:08 AM

## 2013-01-11 ENCOUNTER — Encounter: Payer: BC Managed Care – PPO | Admitting: Physical Medicine and Rehabilitation

## 2013-01-11 ENCOUNTER — Ambulatory Visit: Payer: BC Managed Care – PPO | Admitting: Occupational Therapy

## 2013-01-11 DIAGNOSIS — E559 Vitamin D deficiency, unspecified: Secondary | ICD-10-CM | POA: Diagnosis present

## 2013-01-11 LAB — GLUCOSE, CAPILLARY
Glucose-Capillary: 148 mg/dL — ABNORMAL HIGH (ref 70–99)
Glucose-Capillary: 135 mg/dL — ABNORMAL HIGH (ref 70–99)

## 2013-01-11 LAB — BASIC METABOLIC PANEL
CO2: 24 mEq/L (ref 19–32)
Calcium: 9.5 mg/dL (ref 8.4–10.5)
Chloride: 101 mEq/L (ref 96–112)
Sodium: 135 mEq/L (ref 135–145)

## 2013-01-11 LAB — HEMOGLOBIN A1C
Hgb A1c MFr Bld: 8.2 % — ABNORMAL HIGH (ref <5.7)
Mean Plasma Glucose: 189 mg/dL — ABNORMAL HIGH (ref <117)

## 2013-01-11 LAB — VITAMIN D 25 HYDROXY (VIT D DEFICIENCY, FRACTURES): Vit D, 25-Hydroxy: 19 ng/mL — ABNORMAL LOW (ref 30–89)

## 2013-01-11 MED ORDER — VITAMIN D (ERGOCALCIFEROL) 1.25 MG (50000 UNIT) PO CAPS
50000.0000 [IU] | ORAL_CAPSULE | Freq: Once | ORAL | Status: AC
  Administered 2013-01-11: 50000 [IU] via ORAL
  Filled 2013-01-11: qty 1

## 2013-01-11 MED ORDER — VITAMIN D3 25 MCG (1000 UNIT) PO TABS: 1000.0000 [IU] | ORAL_TABLET | Freq: Every day | ORAL | Status: DC

## 2013-01-11 NOTE — Progress Notes (Signed)
Physical Therapy Treatment Patient Details Name: MEGHA AGNES MRN: [id number] DOB: 09-09-1951 Today's Date: 01/11/2013 Time: 4098-1191 PT Time Calculation (min): 25 min  PT Assessment / Plan / Recommendation Comments on Treatment Session  Pt presents with reccurrent falls recently, resulting in most recent L humerus fx. Pt demo fear of falling and congnitive deficits. pt requires redirction and constant cues for safety and task completion. Pt is home 12hrs a day alone when husband works. Recommend SNF for continued therapy upon acute D/C for continued therapy to reduce risk of falls.    Follow Up Recommendations  Supervision/Assistance - 24 hour;SNF     Does the patient have the potential to tolerate intense rehabilitation     Barriers to Discharge        Equipment Recommendations  None recommended by PT    Recommendations for Other Services    Frequency Min 5X/week   Plan Discharge plan remains appropriate;Frequency remains appropriate    Precautions / Restrictions Precautions Precautions: Fall Restrictions Weight Bearing Restrictions: Yes LUE Weight Bearing: Non weight bearing LLE Weight Bearing: Weight bearing as tolerated   Pertinent Vitals/Pain 4/10 in L UE. Premedicated.    Mobility  Bed Mobility Bed Mobility: Not assessed Transfers Transfers: Sit to Stand;Stand to Sit;Stand Pivot Transfers Sit to Stand: 4: Min assist;Without upper extremity assist;From chair/3-in-1 Stand to Sit: 4: Min assist;With upper extremity assist;To chair/3-in-1 Stand Pivot Transfers: 3: Mod assist Details for Transfer Assistance: pt very fearful of falling; requires constant cues and redirection for sequencing tasks. Ambulation/Gait Ambulation/Gait Assistance: Not tested (comment) Assistive device: None Stairs: No Wheelchair Mobility Wheelchair Mobility: No    Exercises General Exercises - Lower Extremity Long Arc Quad: AROM;Both;10 reps;Strengthening;Seated Mini-Sqauts: 10  reps;Strengthening   PT Diagnosis:    PT Problem List:   PT Treatment Interventions:     PT Goals Acute Rehab PT Goals PT Goal Formulation: With patient Time For Goal Achievement: 01/17/13 Potential to Achieve Goals: Good PT Goal: Sit to Stand - Progress: Progressing toward goal PT Goal: Stand to Sit - Progress: Progressing toward goal PT Transfer Goal: Bed to Chair/Chair to Bed - Progress: Progressing toward goal  Visit Information  Last PT Received On: 01/11/13 Assistance Needed: +1    Subjective Data  Subjective: I do not want to fall again.  Patient Stated Goal: to not fall   Cognition  Cognition Overall Cognitive Status: No family/caregiver present to determine baseline cognitive functioning Area of Impairment: Memory;Safety/judgement;Attention Arousal/Alertness: Awake/alert Orientation Level: Appears intact for tasks assessed Behavior During Session: Anxious Current Attention Level: Sustained (min cues ) Attention - Other Comments: redirection required to complete tasks Memory: Decreased recall of precautions Memory Deficits: decreased STM; requires constant cues and one step commands to complete tasks. Safety/Judgement: Decreased awareness of safety precautions    Balance  Balance Balance Assessed: Yes Static Standing Balance Static Standing - Balance Support: Right upper extremity supported Static Standing - Level of Assistance: 3: Mod assist Dynamic Standing Balance Dynamic Standing - Balance Support: Right upper extremity supported Dynamic Standing - Level of Assistance: 3: Mod assist  End of Session PT - End of Session Equipment Utilized During Treatment: Gait belt Activity Tolerance: Patient tolerated treatment well Patient left: in chair;with call bell/phone within reach Nurse Communication: Mobility status   GP Functional Assessment Tool Used: clinical judgement Functional Limitation: Mobility: Walking and moving around Mobility: Walking and Moving  Around Current Status ([id number]): At least 20 percent but less than 40 percent impaired, limited or restricted Mobility: Walking and  Moving Around Goal Status [phone]): At least 1 percent but less than 20 percent impaired, limited or restricted   Donell Sievert, [place] [phone] 01/11/2013, 9:34 AM

## 2013-01-11 NOTE — Progress Notes (Signed)
Patient ID: SHANIGUA GIBB  female  ZOX:[id number]    DOB: [birth year]    DOA: 01/09/2013  PCP: [name], MD  Assessment/Plan: Principal Problem:   Falls with acute left humeral fracture - Status post multiple falls and fractures, will check vitamin D levels, calcium, mag level - Orthopedics consulted- no acute surgical intervention needed, continue left arm sling  - PT and OT evaluation  done, recommended 24-hour supervision/ SNF. Per patient husband works for 12 hrs, cannot provide supervision.  Vitamin D deficiency: Vit D 25, OH level 19, low likely cause of her multiple fractures - Started on vitamin D 50,000 units q. Weekly until levels in normal range, then cont maintenance dose   Active Problems:   HTN (hypertension): Continue home medications, currently stable    HLD (hyperlipidemia) - Continue statins    Diabetes mellitus -Hemoglobin A1c 8.2, place on sliding scale insulin while inpatient, DC metformin   DVT Prophylaxis:Lovenox  Code Status:  Disposition: awaiting skilled nursing facility    Subjective: Left arm in sling, pain controlled, no specific complaints   Objective: Weight change:   Intake/Output Summary (Last 24 hours) at 01/11/13 1214 Last data filed at 01/11/13 1100  Gross per 24 hour  Intake 2548.33 ml  Output    400 ml  Net 2148.33 ml   Blood pressure 150/60, pulse 90, temperature 98.7 F (37.1 C), temperature source Oral, resp. rate 16, height 5' (1.524 m), weight 90.084 kg (198 lb 9.6 oz), last menstrual period 12/28/2012, SpO2 99.00%.  Physical Exam: General: Ax O x3. CVS: S1-S2 clear Chest: CTAB Abdomen: soft NT, ND, NBS Extremities: no c/c left arm in sling. Left leg in the boot   Lab Results: Basic Metabolic Panel:  Recent Labs Lab 01/10/13 0655 01/10/13 1637 01/11/13 0605  NA 136  --  135  K 4.4  --  3.9  CL 100  --  101  CO2 25  --  24  GLUCOSE 180*  --  173*  BUN 14  --  14  CREATININE 0.87  --  0.80  CALCIUM  10.2  --  9.5  MG  --  1.7  --    Liver Function Tests:  Recent Labs Lab 01/09/13 1809  AST 28  ALT 24  ALKPHOS 185*  BILITOT 0.4  PROT 8.2  ALBUMIN 3.5  CBC:  Recent Labs Lab 01/09/13 1809 01/09/13 2153 01/10/13 0655  WBC 17.2* 16.5* 13.7*  NEUTROABS 11.9*  --   --   HGB 12.7 12.6 12.3  HCT 37.5 36.7 37.5  MCV 88.0 87.0 87.0  PLT 365 330 328     Recent Labs Lab 01/10/13 0751 01/10/13 1621 01/10/13 2053 01/11/13 0648 01/11/13 1134  GLUCAP 175* 135* 172* 148* 150*     Micro Results: No results found for this or any previous visit (from the past 240 hour(s)).  Studies/Results: Dg Ankle Complete Left  12/24/2012  *RADIOLOGY REPORT*  Clinical Data: Fall, posterior ankle pain.  LEFT ANKLE COMPLETE - 3+ VIEW  Comparison: None.  Findings: Mildly comminuted fracture involving the posterior/superior aspect of the calcaneus.  This includes the insertion site of the Achilles tendon, which is likely retracted/thickened.  The ankle mortise is intact.  Mild soft tissue swelling.  IMPRESSION: Mildly comminuted fracture involving the posterior/superior aspect of the calcaneus, as described above.   Original Report Authenticated By: Charline Bills, M.D.    Dg Shoulder Left  01/09/2013  *RADIOLOGY REPORT*  Clinical Data: Fall.  Pain.  LEFT SHOULDER -  2+ VIEW  Comparison: None.  Findings: There is an impacted fracture of the humeral neck.  The views are limited because of patient ability.  There are multiple rib fractures on the left which are old.  IMPRESSION: .  Acute fracture of the neck of the humerus.   Original Report Authenticated By: Paulina Fusi, M.D.     Medications: Scheduled Meds: . atorvastatin  20 mg Oral q1800  . citalopram  10 mg Oral Daily  . clopidogrel  75 mg Oral QHS  . enoxaparin (LOVENOX) injection  40 mg Subcutaneous Q24H  . hydrochlorothiazide  25 mg Oral Daily  . insulin aspart  0-15 Units Subcutaneous TID WC  . insulin aspart  0-5 Units Subcutaneous  QHS  . losartan  100 mg Oral QHS  . metoprolol  50 mg Oral BID  . venlafaxine XR  150 mg Oral QHS      LOS: 2 days   RAI,RIPUDEEP M.D. [facility] 01/11/2013, 12:14 PM Pager: [phone]  If 7PM-7AM, please contact night-coverage [email or web address] Password [email or web address]

## 2013-01-11 NOTE — Progress Notes (Signed)
Patient chose a bed at [facility]. Awaiting [employer] authorization.   Sabino Niemann, MSW,  [phone]

## 2013-01-12 DIAGNOSIS — E785 Hyperlipidemia, unspecified: Secondary | ICD-10-CM

## 2013-01-12 DIAGNOSIS — I10 Essential (primary) hypertension: Secondary | ICD-10-CM

## 2013-01-12 LAB — GLUCOSE, CAPILLARY

## 2013-01-12 MED ORDER — PERCOCET 5-325 MG PO TABS: 1.0000 | ORAL_TABLET | ORAL | Status: DC | PRN

## 2013-01-12 MED ORDER — METOPROLOL TARTRATE 50 MG PO TABS
75.0000 mg | ORAL_TABLET | Freq: Two times a day (BID) | ORAL | Status: DC
  Administered 2013-01-12 – 2013-01-13 (×2): 75 mg via ORAL
  Filled 2013-01-12 (×3): qty 1

## 2013-01-12 MED ORDER — VITAMIN D3 1.25 MG (50000 UT) PO CAPS: 1.0000 | ORAL_CAPSULE | ORAL | Status: DC

## 2013-01-12 MED ORDER — VENLAFAXINE HCL ER 150 MG PO CP24: 150.0000 mg | ORAL_CAPSULE | Freq: Every day | ORAL | Status: DC

## 2013-01-12 MED ORDER — METOPROLOL TARTRATE 50 MG PO TABS: 75.0000 mg | ORAL_TABLET | Freq: Two times a day (BID) | ORAL | Status: DC

## 2013-01-12 NOTE — Discharge Summary (Signed)
Physician Discharge Summary  Patient ID: [name] MRN: [id number] DOB/AGE: 05/25/1951 62 y.o.  Admit date: 01/09/2013 Discharge date: 01/12/2013  Primary Care Physician:  [name], MD  Discharge Diagnoses:    . HTN (hypertension) . Diabetes mellitus . HLD (hyperlipidemia) . Unspecified vitamin D deficiency Recurrent falls Recent left calcaneal fracture Left humeral fracture- nonoperative  Consults: Orthopedics, Dr. Lajoyce Corners    Discharge Medications:   Medication List    STOP taking these medications       oxyCODONE-acetaminophen 5-325 MG per tablet  Commonly known as:  ROXICET      TAKE these medications       atorvastatin 20 MG tablet  Commonly known as:  LIPITOR  Take 20 mg by mouth daily at 6 PM.     citalopram 10 MG tablet  Commonly known as:  CELEXA  Take 10 mg by mouth daily.     clopidogrel 75 MG tablet  Commonly known as:  PLAVIX  Take 75 mg by mouth at bedtime.     hydrochlorothiazide 25 MG tablet  Commonly known as:  HYDRODIURIL  Take 25 mg by mouth daily. For blood pressure.     losartan 100 MG tablet  Commonly known as:  COZAAR  Take 100 mg by mouth at bedtime.     metFORMIN 500 MG tablet  Commonly known as:  GLUCOPHAGE  Take 500 mg by mouth 2 (two) times daily with a meal.     metoprolol 50 MG tablet  Commonly known as:  LOPRESSOR  Take 1.5 tablets (75 mg total) by mouth 2 (two) times daily.     PERCOCET 5-325 MG per tablet  Generic drug:  oxyCODONE-acetaminophen  Take 1 tablet by mouth every 4 (four) hours as needed for pain.     venlafaxine XR 150 MG 24 hr capsule  Commonly known as:  EFFEXOR-XR  Take 1 capsule (150 mg total) by mouth at bedtime.     Vitamin D3 50000 UNITS Caps  Take 1 capsule by mouth once a week. Every Sunday.         Brief H and P: For complete details please refer to admission H and P, but in brief 62 year old female who has had recurrent falls recently with fractures of calcaneus about a week  ago and now humeral fracture. Patient has history of hypertension and diabetes and previous stroke. She has been using a walker since her 2 episodes of strokes. She had a fall on March the 5th leading to left calcaneal fracture which was repaired by Dr. Aldean Baker. She went home was recuperating and had another fall on the day of admission. She presented with left humeral fracture. He is here with her husband who has been unable to give her care at home especially with multiple fractures. She reported being weak generally but no focal new weakness. She denied passing out but felt lightheaded prior to the falls. She denied any chest pain or palpitations. No nausea vomiting or diarrhea. She has a 5/10 pain on her left upper extremities when she has her fracture otherwise she's at her baseline.   Hospital Course:   Recurrent Falls with acute left humeral fracture: Status post multiple falls and fractures due to recent strokes and generalized debility. Due to recent left calcaneal fracture patient is requiring significant amount of assistance with ADLs at home which is likely the cause of her recurrent falls including new diagnosis of vitamin D deficiency. Orthopedics was consulted and patient was seen by Dr.  Lajoyce Corners who recommended no acute surgical intervention for left humeral fracture and continue left arm sling. PT OT evaluation was done and recommended 24-hour supervision/ SNF. Per patient, her husband works for 12 hrs, cannot provide supervision.  Vitamin D deficiency: Vit D 25, OH level 19, low likely cause of her multiple fractures, started on vitamin D 50,000 units q. Weekly until levels in normal range (30-89ng/ml), then cont maintenance dose of 1000-2000units daily. Vitamin D level should be checked in 4-6 weeks again.  HTN (hypertension): Continue home medications, metoprolol was increased to 75mg  BID, continue losartan and HCTZ  HLD (hyperlipidemia)  Continue statins   Diabetes mellitus   -Hemoglobin A1c 8.2, continue metformin  History of CVA: Continue Plavix  Day of Discharge BP 160/88  Pulse 100  Temp(Src) 98.2 F (36.8 C) (Oral)  Resp 18  Ht 5' (1.524 m)  Wt 90.084 kg (198 lb 9.6 oz)  BMI 38.79 kg/m2  SpO2 100%  LMP 12/28/2012  Physical Exam:  General: Ax O x3.  CVS: S1-S2 clear  Chest: CTAB  Abdomen: soft NT, ND, NBS  Extremities: no c/c left arm in sling. Left leg in the boot      The results of significant diagnostics from this hospitalization (including imaging, microbiology, ancillary and laboratory) are listed below for reference.    LAB RESULTS: Basic Metabolic Panel:  Recent Labs Lab 01/10/13 0655 01/10/13 1637 01/11/13 0605  NA 136  --  135  K 4.4  --  3.9  CL 100  --  101  CO2 25  --  24  GLUCOSE 180*  --  173*  BUN 14  --  14  CREATININE 0.87  --  0.80  CALCIUM 10.2  --  9.5  MG  --  1.7  --    Liver Function Tests:  Recent Labs Lab 01/09/13 1809  AST 28  ALT 24  ALKPHOS 185*  BILITOT 0.4  PROT 8.2  ALBUMIN 3.5   No results found for this basename: LIPASE, AMYLASE,  in the last 168 hours No results found for this basename: AMMONIA,  in the last 168 hours CBC:  Recent Labs Lab 01/09/13 1809 01/09/13 2153 01/10/13 0655  WBC 17.2* 16.5* 13.7*  NEUTROABS 11.9*  --   --   HGB 12.7 12.6 12.3  HCT 37.5 36.7 37.5  MCV 88.0 87.0 87.0  PLT 365 330 328   Cardiac Enzymes: No results found for this basename: CKTOTAL, CKMB, CKMBINDEX, TROPONINI,  in the last 168 hours BNP: No components found with this basename: POCBNP,  CBG:  Recent Labs Lab 01/12/13 0625 01/12/13 1135  GLUCAP 137* 226*    Significant Diagnostic Studies:  Dg Shoulder Left  01/09/2013  *RADIOLOGY REPORT*  Clinical Data: Fall.  Pain.  LEFT SHOULDER - 2+ VIEW  Comparison: None.  Findings: There is an impacted fracture of the humeral neck.  The views are limited because of patient ability.  There are multiple rib fractures on the left which are  old.  IMPRESSION: .  Acute fracture of the neck of the humerus.   Original Report Authenticated By: Paulina Fusi, M.D.       Disposition and Follow-up: Discharge Orders   Future Orders Complete By Expires         Diet Carb Modified  As directed     Increase activity slowly  As directed     Other Restrictions  As directed     Comments:      Full weightbearing on  left lower extremity with fracture boot in place for transfers only. Non weight bearing for left upper arm, continue arm sling        DISPOSITION: Skilled nursing facility DIET: Carb modified diet ACTIVITY: As tolerated TESTS THAT NEED FOLLOW-UP Vitamin D level in 4-6 weeks  DISCHARGE FOLLOW-UP Follow-up Information   Follow up with AVBUERE,EDWIN A, MD. Schedule an appointment as soon as possible for a visit in 2 weeks.   Contact information:   [address] [place] [address] [phone]       Follow up with DUDA,[name], MD. Schedule an appointment as soon as possible for a visit in 2 weeks. (for follow-up)    Contact information:   [address] [place] [address] [phone]       Time spent on Discharge: 40 mins  Signed:   Kalli Greenfield M.D. [facility] 01/12/2013, 12:15 PM Pager: [phone]

## 2013-01-12 NOTE — Progress Notes (Signed)
Utilization review completed. Kasmira Cacioppo, RN, BSN. 

## 2013-01-12 NOTE — Progress Notes (Signed)
Physical Therapy Treatment Patient Details Name: JULI ODOM MRN: [id number] DOB: 11-25-1950 Today's Date: 01/12/2013 Time: 4098-1191 PT Time Calculation (min): 25 min  PT Assessment / Plan / Recommendation Comments on Treatment Session  Pt presents with reccurrent falls recently, resulting in most recent L humerus fx. Pt demo fear of falling and congnitive deficits. pt requires redirction and constant cues for safety and task completion. Pt is home 12hrs a day alone when husband works. Recommend SNF for continued therapy upon acute D/C for continued therapy to reduce risk of falls.    Follow Up Recommendations  Supervision/Assistance - 24 hour;SNF     Does the patient have the potential to tolerate intense rehabilitation     Barriers to Discharge        Equipment Recommendations  None recommended by PT    Recommendations for Other Services    Frequency Min 5X/week   Plan Discharge plan remains appropriate;Frequency remains appropriate    Precautions / Restrictions Precautions Required Braces or Orthoses: Other Brace/Splint Other Brace/Splint: camboot on L LE ; sling Lt. UE Restrictions LUE Weight Bearing: Non weight bearing LLE Weight Bearing: Weight bearing as tolerated   Pertinent Vitals/Pain     Mobility  Bed Mobility Supine to Sit: 4: Min assist;With rails;HOB elevated Sitting - Scoot to Edge of Bed: 4: Min guard;With rail Details for Bed Mobility Assistance: Cues for sequency and positioning and not to use LUE. A for shoulders and trunk support to come into sitting position EOB Transfers Sit to Stand: 4: Min assist;Without upper extremity assist;From bed Stand to Sit: 4: Min assist;With upper extremity assist;To chair/3-in-1 Stand Pivot Transfers: 3: Mod assist Details for Transfer Assistance:  requires constant cues and redirection for sequencing tasks. A for initation of stand and for stability with transfer. Patient relying heavily on leaning onto PTAs arm.   Ambulation/Gait Ambulation/Gait Assistance: Not tested (comment)    Exercises     PT Diagnosis:    PT Problem List:   PT Treatment Interventions:     PT Goals    Visit Information  Last PT Received On: 01/12/13 Assistance Needed: +1    Subjective Data      Cognition  Cognition Overall Cognitive Status: No family/caregiver present to determine baseline cognitive functioning Area of Impairment: Memory;Safety/judgement;Attention Arousal/Alertness: Awake/alert Orientation Level: Appears intact for tasks assessed Current Attention Level: Sustained Attention - Other Comments: redirection required to complete tasks Memory Deficits: decreased STM; requires constant cues and one step commands to complete tasks. Safety/Judgement: Decreased awareness of safety precautions    Balance     End of Session     GP     Robinette, Adline Potter 01/12/2013, 2:17 PM 01/12/2013 Fredrich Birks PTA [phone] pager [phone] office

## 2013-01-13 ENCOUNTER — Ambulatory Visit: Payer: BC Managed Care – PPO | Admitting: Occupational Therapy

## 2013-01-13 DIAGNOSIS — I6350 Cerebral infarction due to unspecified occlusion or stenosis of unspecified cerebral artery: Secondary | ICD-10-CM

## 2013-01-13 LAB — GLUCOSE, CAPILLARY: Glucose-Capillary: 159 mg/dL — ABNORMAL HIGH (ref 70–99)

## 2013-01-13 NOTE — Progress Notes (Signed)
Patient ID: [name]  female  ZOX:[id number]    DOB: 29-Apr-1951    DOA: 01/09/2013  PCP: [name], MD  Assessment/Plan: Principal Problem:   Falls with acute left humeral fracture - Orthopedics consulted- no acute surgical intervention needed, continue left arm sling  - PT and OT evaluation  done, recommended 24-hour supervision/ SNF. Per patient husband works for 12 hrs, cannot provide supervision.  Vitamin D deficiency: Vit D 25, OH level 19, low likely cause of her multiple fractures - Started on vitamin D 50,000 units q. Weekly until levels in normal range, then cont maintenance dose   Active Problems:   HTN (hypertension): Continue home medications, currently stable    HLD (hyperlipidemia) - Continue statins    Diabetes mellitus -Hemoglobin A1c 8.2, place on sliding scale insulin while inpatient, DC metformin   DVT Prophylaxis:Lovenox  Code Status:  Disposition: awaiting skilled nursing facility, DC summary done yesterday, NO CHANGES, patient medically stable for discharge    Subjective:  no specific complaints, awaiting DC today   Objective: Weight change:   Intake/Output Summary (Last 24 hours) at 01/13/13 0908 Last data filed at 01/12/13 1300  Gross per 24 hour  Intake    600 ml  Output      0 ml  Net    600 ml   Blood pressure 148/75, pulse 86, temperature 98.1 F (36.7 C), temperature source Oral, resp. rate 18, height 5' (1.524 m), weight 90.084 kg (198 lb 9.6 oz), last menstrual period 12/28/2012, SpO2 98.00%.  Physical Exam: General: Ax O x3. CVS: S1-S2 clear Chest: CTAB Abdomen: soft NT, ND, NBS Extremities: no c/c/e, left arm in sling. Left leg in the boot   Lab Results: Basic Metabolic Panel:  Recent Labs Lab 01/10/13 0655 01/10/13 1637 01/11/13 0605  NA 136  --  135  K 4.4  --  3.9  CL 100  --  101  CO2 25  --  24  GLUCOSE 180*  --  173*  BUN 14  --  14  CREATININE 0.87  --  0.80  CALCIUM 10.2  --  9.5  MG  --  1.7   --    Liver Function Tests:  Recent Labs Lab 01/09/13 1809  AST 28  ALT 24  ALKPHOS 185*  BILITOT 0.4  PROT 8.2  ALBUMIN 3.5  CBC:  Recent Labs Lab 01/09/13 1809 01/09/13 2153 01/10/13 0655  WBC 17.2* 16.5* 13.7*  NEUTROABS 11.9*  --   --   HGB 12.7 12.6 12.3  HCT 37.5 36.7 37.5  MCV 88.0 87.0 87.0  PLT 365 330 328     Recent Labs Lab 01/12/13 0625 01/12/13 1135 01/12/13 1621 01/12/13 2148 01/13/13 0649  GLUCAP 137* 226* 140* 243* 159*     Micro Results: No results found for this or any previous visit (from the past 240 hour(s)).  Studies/Results: Dg Ankle Complete Left  12/24/2012  *RADIOLOGY REPORT*  Clinical Data: Fall, posterior ankle pain.  LEFT ANKLE COMPLETE - 3+ VIEW  Comparison: None.  Findings: Mildly comminuted fracture involving the posterior/superior aspect of the calcaneus.  This includes the insertion site of the Achilles tendon, which is likely retracted/thickened.  The ankle mortise is intact.  Mild soft tissue swelling.  IMPRESSION: Mildly comminuted fracture involving the posterior/superior aspect of the calcaneus, as described above.   Original Report Authenticated By: Charline Bills, M.D.    Dg Shoulder Left  01/09/2013  *RADIOLOGY REPORT*  Clinical Data: Fall.  Pain.  LEFT SHOULDER - 2+ VIEW  Comparison: None.  Findings: There is an impacted fracture of the humeral neck.  The views are limited because of patient ability.  There are multiple rib fractures on the left which are old.  IMPRESSION: .  Acute fracture of the neck of the humerus.   Original Report Authenticated By: Paulina Fusi, M.D.     Medications: Scheduled Meds: . atorvastatin  20 mg Oral q1800  . citalopram  10 mg Oral Daily  . clopidogrel  75 mg Oral QHS  . enoxaparin (LOVENOX) injection  40 mg Subcutaneous Q24H  . hydrochlorothiazide  25 mg Oral Daily  . insulin aspart  0-15 Units Subcutaneous TID WC  . insulin aspart  0-5 Units Subcutaneous QHS  . losartan  100 mg  Oral QHS  . metoprolol  75 mg Oral BID  . venlafaxine XR  150 mg Oral QHS      LOS: 4 days   RAI,RIPUDEEP M.D. [facility] 01/13/2013, 9:08 AM Pager: [phone]  If 7PM-7AM, please contact night-coverage [email or web address] Password [email or web address]

## 2013-01-18 ENCOUNTER — Ambulatory Visit: Payer: BC Managed Care – PPO | Admitting: Occupational Therapy

## 2013-01-21 ENCOUNTER — Ambulatory Visit: Payer: BC Managed Care – PPO | Admitting: Occupational Therapy

## 2013-01-25 ENCOUNTER — Ambulatory Visit: Payer: BC Managed Care – PPO | Admitting: Occupational Therapy

## 2013-01-28 ENCOUNTER — Ambulatory Visit: Payer: BC Managed Care – PPO | Admitting: Occupational Therapy

## 2013-03-22 ENCOUNTER — Ambulatory Visit: Payer: BC Managed Care – PPO | Admitting: Physical Therapy

## 2013-03-23 ENCOUNTER — Ambulatory Visit: Payer: BC Managed Care – PPO | Attending: Orthopedic Surgery | Admitting: Physical Therapy

## 2013-03-23 DIAGNOSIS — R269 Unspecified abnormalities of gait and mobility: Secondary | ICD-10-CM | POA: Insufficient documentation

## 2013-03-23 DIAGNOSIS — IMO0001 Reserved for inherently not codable concepts without codable children: Secondary | ICD-10-CM | POA: Insufficient documentation

## 2013-03-23 DIAGNOSIS — I69998 Other sequelae following unspecified cerebrovascular disease: Secondary | ICD-10-CM | POA: Insufficient documentation

## 2013-03-28 ENCOUNTER — Ambulatory Visit: Payer: BC Managed Care – PPO | Attending: Orthopedic Surgery | Admitting: Physical Therapy

## 2013-03-28 DIAGNOSIS — R269 Unspecified abnormalities of gait and mobility: Secondary | ICD-10-CM | POA: Insufficient documentation

## 2013-03-28 DIAGNOSIS — I69998 Other sequelae following unspecified cerebrovascular disease: Secondary | ICD-10-CM | POA: Insufficient documentation

## 2013-03-28 DIAGNOSIS — IMO0001 Reserved for inherently not codable concepts without codable children: Secondary | ICD-10-CM | POA: Insufficient documentation

## 2013-04-01 ENCOUNTER — Ambulatory Visit: Payer: BC Managed Care – PPO | Admitting: Physical Therapy

## 2013-04-05 ENCOUNTER — Ambulatory Visit: Payer: BC Managed Care – PPO | Admitting: Physical Therapy

## 2013-04-05 ENCOUNTER — Ambulatory Visit: Payer: BC Managed Care – PPO | Admitting: Occupational Therapy

## 2013-04-07 ENCOUNTER — Ambulatory Visit: Payer: BC Managed Care – PPO | Admitting: Occupational Therapy

## 2013-04-07 ENCOUNTER — Ambulatory Visit: Payer: BC Managed Care – PPO | Admitting: Physical Therapy

## 2013-04-12 ENCOUNTER — Ambulatory Visit: Payer: BC Managed Care – PPO | Admitting: Physical Therapy

## 2013-04-12 ENCOUNTER — Ambulatory Visit: Payer: BC Managed Care – PPO | Admitting: Occupational Therapy

## 2013-04-14 ENCOUNTER — Ambulatory Visit: Payer: BC Managed Care – PPO | Admitting: Physical Therapy

## 2013-04-14 ENCOUNTER — Ambulatory Visit: Payer: BC Managed Care – PPO | Admitting: Occupational Therapy

## 2013-04-19 ENCOUNTER — Ambulatory Visit: Payer: BC Managed Care – PPO | Admitting: Occupational Therapy

## 2013-04-19 ENCOUNTER — Ambulatory Visit: Payer: BC Managed Care – PPO | Admitting: Physical Therapy

## 2013-04-21 ENCOUNTER — Ambulatory Visit: Payer: BC Managed Care – PPO | Admitting: Occupational Therapy

## 2013-04-21 ENCOUNTER — Ambulatory Visit: Payer: BC Managed Care – PPO | Admitting: Physical Therapy

## 2013-04-26 ENCOUNTER — Ambulatory Visit: Payer: BC Managed Care – PPO | Admitting: Occupational Therapy

## 2013-04-26 ENCOUNTER — Ambulatory Visit: Payer: BC Managed Care – PPO | Attending: Orthopedic Surgery | Admitting: Physical Therapy

## 2013-04-26 DIAGNOSIS — R269 Unspecified abnormalities of gait and mobility: Secondary | ICD-10-CM | POA: Insufficient documentation

## 2013-04-26 DIAGNOSIS — I69998 Other sequelae following unspecified cerebrovascular disease: Secondary | ICD-10-CM | POA: Insufficient documentation

## 2013-04-26 DIAGNOSIS — IMO0001 Reserved for inherently not codable concepts without codable children: Secondary | ICD-10-CM | POA: Insufficient documentation

## 2013-04-28 ENCOUNTER — Ambulatory Visit: Payer: BC Managed Care – PPO | Admitting: Occupational Therapy

## 2013-04-28 ENCOUNTER — Ambulatory Visit: Payer: BC Managed Care – PPO | Admitting: Physical Therapy

## 2013-05-03 ENCOUNTER — Ambulatory Visit: Payer: BC Managed Care – PPO | Admitting: Physical Therapy

## 2013-05-03 ENCOUNTER — Ambulatory Visit: Payer: BC Managed Care – PPO | Admitting: Occupational Therapy

## 2013-05-05 ENCOUNTER — Ambulatory Visit: Payer: BC Managed Care – PPO | Admitting: Physical Therapy

## 2013-05-05 ENCOUNTER — Ambulatory Visit: Payer: BC Managed Care – PPO | Admitting: Occupational Therapy

## 2013-05-10 ENCOUNTER — Ambulatory Visit: Payer: BC Managed Care – PPO | Admitting: Occupational Therapy

## 2013-05-10 ENCOUNTER — Ambulatory Visit: Payer: BC Managed Care – PPO | Admitting: Physical Therapy

## 2013-05-12 ENCOUNTER — Ambulatory Visit: Payer: BC Managed Care – PPO | Admitting: Occupational Therapy

## 2013-05-12 ENCOUNTER — Ambulatory Visit: Payer: BC Managed Care – PPO | Admitting: Physical Therapy

## 2013-05-18 ENCOUNTER — Ambulatory Visit: Payer: BC Managed Care – PPO | Admitting: Occupational Therapy

## 2013-05-20 ENCOUNTER — Ambulatory Visit: Payer: BC Managed Care – PPO | Admitting: Occupational Therapy

## 2013-05-24 ENCOUNTER — Ambulatory Visit: Payer: BC Managed Care – PPO | Admitting: Occupational Therapy

## 2013-05-26 ENCOUNTER — Ambulatory Visit: Payer: BC Managed Care – PPO | Admitting: Occupational Therapy

## 2013-10-13 ENCOUNTER — Other Ambulatory Visit: Payer: Self-pay | Admitting: Internal Medicine

## 2013-10-13 DIAGNOSIS — E2839 Other primary ovarian failure: Secondary | ICD-10-CM

## 2013-11-14 ENCOUNTER — Ambulatory Visit
Admission: RE | Admit: 2013-11-14 | Discharge: 2013-11-14 | Disposition: A | Discharge status: Home / Self Care | Payer: BC Managed Care – PPO | Source: Ambulatory Visit | Attending: Internal Medicine | Admitting: Internal Medicine

## 2013-11-14 DIAGNOSIS — E2839 Other primary ovarian failure: Secondary | ICD-10-CM

## 2015-10-31 ENCOUNTER — Ambulatory Visit: Payer: Medicare Other | Admitting: Neurology

## 2015-11-13 ENCOUNTER — Encounter: Payer: Self-pay | Admitting: Neurology

## 2015-11-13 ENCOUNTER — Telehealth: Payer: Self-pay | Admitting: Neurology

## 2015-11-13 ENCOUNTER — Ambulatory Visit (INDEPENDENT_AMBULATORY_CARE_PROVIDER_SITE_OTHER): Payer: Medicare Other | Admitting: Neurology

## 2015-11-13 VITALS — BP 114/78 | HR 90 | Ht 61.0 in | Wt 166.5 lb

## 2015-11-13 DIAGNOSIS — E538 Deficiency of other specified B group vitamins: Secondary | ICD-10-CM | POA: Diagnosis not present

## 2015-11-13 DIAGNOSIS — R5382 Chronic fatigue, unspecified: Secondary | ICD-10-CM

## 2015-11-13 DIAGNOSIS — I69398 Other sequelae of cerebral infarction: Secondary | ICD-10-CM

## 2015-11-13 DIAGNOSIS — F05 Delirium due to known physiological condition: Secondary | ICD-10-CM | POA: Diagnosis not present

## 2015-11-13 DIAGNOSIS — I69359 Hemiplegia and hemiparesis following cerebral infarction affecting unspecified side: Secondary | ICD-10-CM | POA: Diagnosis not present

## 2015-11-13 DIAGNOSIS — R410 Disorientation, unspecified: Secondary | ICD-10-CM

## 2015-11-13 HISTORY — DX: Hemiplegia and hemiparesis following cerebral infarction affecting unspecified side: I69.359

## 2015-11-13 HISTORY — DX: Hemiplegia and hemiparesis following cerebral infarction affecting unspecified side: I69.398

## 2015-11-13 NOTE — Telephone Encounter (Signed)
Patient check in at [facility] and is seeing Dr. Jannifer Franklin.

## 2015-11-13 NOTE — Progress Notes (Signed)
Reason for visit: Confusion  Referring physician: Dr. Arie Sabina [name] is a 65 y.o. female  History of present illness:  [name] is a 65 year old left-handed black female with a history of cerebrovascular disease and diabetes. The patient last sustained a stroke in September 2013 with a right parietal subcortical stroke. 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 The patient has had a gradual onset of increased confusion, lethargy, and hypersomnolence that has occurred over the last 4 months. The patient does not snore. The patient has had a decline in her physical abilities, she is not been out of the house much. She has not been as attentive to her personal hygiene. The patient has had some mild changes in balance. The patient has not had any recent falls. She does have some urinary incontinence. She denies any focal numbness or weakness of the face, arms, or legs with exception that the left arm is slightly more numb than usual. She needs some help keeping up with her medications. She has diabetes, but she does not check her blood sugars regularly. There have been no reports of hypoglycemia. The patient is sent to this office for an evaluation of her mental status change. The family indicates that there have been no new medications added or subtracted around the time of onset of the mental status change.  Past Medical History  Diagnosis Date  . Hypertension   . Diabetes mellitus   . Depression   . Hyperlipidemia   . Bronchitis   . SOB (shortness of breath)   . Arthritis   . Dizziness   . Weakness   . Sore throat   . Sinus problem   . Family history of breast cancer   . Heart attack [facility] 2004    uses to see Dr [name]- doesnt see anyone since  . Stroke [facility])     left side weaker/ uses a walker, uses most of time.  . Chronic kidney disease     possible  stones - confirm with patient  . Renal cancer ([name])   . Sleep apnea     study done around 05, hasnt uesd in a whiloe  . Hemiparesis and alteration of sensations as late effects of stroke ([place]) 11/13/2015    Past Surgical History  Procedure Laterality Date  . Spleen removal  2001  . Laparoscopic gastric banding  05/09/2008  . Abdominal hysterectomy  2002  . Cholecystectomy  1999  . Nephrectomy  2001    due to cancer  . Carpal tunnel release    .  fracture achilles Left 12/29/2012    achilles calcaneal fracture      Dr Sharol Given  . Achilles tendon surgery Left 12/29/2012    Procedure: ACHILLES TENDON REPAIR;  Surgeon: Erica Minion, MD;  Location: [place];  Service: Orthopedics;  Laterality: Left;  Resection Left Calcaneal fracture and repair Achilles    Family History  Problem Relation Age of Onset  . Diabetes Father   . Alzheimer's disease Father   . Kidney cancer Father   . Hypertension Father   . Kidney disease Father     kidney removal  . Hypertension Mother   . Arthritis Mother   . Stroke Brother   . Diabetes Brother   . Hypertension Brother   . Diabetes Sister   . Thyroid disease Sister   . Hyperlipidemia Sister   .  Other Sister     thyroidectomy    Social history:  reports that she has never smoked. She has never used smokeless tobacco. She reports that she does not drink alcohol or use illicit drugs.  Medications:  Prior to Admission medications   Medication Sig Start Date End Date Taking? Authorizing Provider  atorvastatin (LIPITOR) 20 MG tablet Take 20 mg by mouth daily at 6 PM. 07/22/12  Yes Erica June Leap, MD  Cholecalciferol (VITAMIN D3) 50000 UNITS CAPS Take 1 capsule by mouth once a week. Every Sunday. 01/12/13  Yes Erica Krystal Eaton, MD  citalopram (CELEXA) 10 MG tablet Take 10 mg by mouth daily.   Yes Historical Provider, MD  clopidogrel (PLAVIX) 75 MG tablet Take 75 mg by mouth at bedtime.   Yes Historical Provider, MD  hydrochlorothiazide (HYDRODIURIL) 25 MG tablet  Take 25 mg by mouth daily. For blood pressure. 12/03/12  Yes Santiago Glad Prueter, PA-C  losartan (COZAAR) 100 MG tablet Take 100 mg by mouth at bedtime.   Yes Historical Provider, MD  metFORMIN (GLUCOPHAGE) 500 MG tablet Take 500 mg by mouth 2 (two) times daily with a meal. 08/06/12  Yes Erica Anchors Love, PA-C  metoprolol (LOPRESSOR) 50 MG tablet Take 1.5 tablets (75 mg total) by mouth 2 (two) times daily. 01/12/13  Yes Erica Krystal Eaton, MD  omeprazole (PRILOSEC) 20 MG capsule Take 20 mg by mouth daily. 09/29/15  Yes Historical Provider, MD  oxybutynin (DITROPAN-XL) 10 MG 24 hr tablet Take 10 mg by mouth daily. 10/03/15  Yes Historical Provider, MD  PERCOCET 5-325 MG per tablet Take 1 tablet by mouth every 4 (four) hours as needed for pain. 01/12/13  Yes Erica Krystal Eaton, MD  venlafaxine XR (EFFEXOR-XR) 150 MG 24 hr capsule Take 1 capsule (150 mg total) by mouth at bedtime. 01/12/13  Yes Erica Krystal Eaton, MD  Vitamin D, Ergocalciferol, (DRISDOL) 50000 units CAPS capsule TAKE ONE CAPSULE ONCE EACH WEEK 09/29/15  Yes Historical Provider, MD      Allergies  Allergen Reactions  . Iohexol      Code: HIVES, Desc: Pt states in 2005 w/ a heart cath, she broke out in large hives and a rash.  She was given 50mg  benadryl, po.  She tolerated the procedure well w/o complication.  Thanks., Onset Date: [id number]     ROS:  Out of a complete 14 system review of symptoms, the patient complains only of the following symptoms, and all other reviewed systems are negative.  Weight loss, fatigue Urinary incontinence Feeling hot, cold Memory loss, confusion Too much sleep, decreased energy, change in appetite, disinterest in activities Sleepiness  Blood pressure 114/78, pulse 90, height 5\' 1"  (1.549 m), weight 166 lb 8 oz (75.524 kg), last menstrual period 12/28/2012.  Physical Exam  General: The patient is alert and cooperative at the time of the examination. The patient is moderately obese.  Eyes: Pupils are equal, round, and  reactive to light. Discs are flat bilaterally.  Neck: The neck is supple, no carotid bruits are noted.  Respiratory: The respiratory examination is clear.  Cardiovascular: The cardiovascular examination reveals a regular rate and rhythm, no obvious murmurs or rubs are noted.  Skin: Extremities are without significant edema.  Neurologic Exam  Mental status: The patient is alert and oriented x 2 at the time of the examination (not oriented to date). The Mini-Mental Status Examination done today reveals a score of 23/30. The patient is able to name 6 four legged animals in 30  seconds.  Cranial nerves: Facial symmetry is present. There is good sensation of the face to pinprick and soft touch bilaterally. The strength of the facial muscles and the muscles to head turning and shoulder shrug are normal bilaterally. Speech is well enunciated, no aphasia or dysarthria is noted. Extraocular movements are full. Visual fields are full. The tongue is midline, and the patient has symmetric elevation of the soft palate. No obvious hearing deficits are noted.  Motor: The motor testing reveals 5 over 5 strength of all 4 extremities. Good symmetric motor tone is noted throughout.  Sensory: Sensory testing is intact to pinprick, soft touch, vibration sensation, and position sense on all 4 extremities, with exception that there is some decrease in position sense of the left foot. No evidence of extinction is noted.  Coordination: Cerebellar testing reveals good finger-nose-finger and heel-to-shin bilaterally. No asterixis is noted.  Gait and station: Gait is normal. Tandem gait is unsteady. Romberg is negative. No drift is seen.  Reflexes: Deep tendon reflexes are symmetric, but are depressed bilaterally. Toes are downgoing bilaterally.   MRI brain 07/20/12:  IMPRESSION: Acute non hemorrhagic posterior right frontal lobe infarct.  Remote right pontine infarct and bilateral thalamic/basal  ganglia infarcts.  Prominent small vessel disease type changes.  * MRI scan images were reviewed online. I agree with the written report.    Assessment/Plan:  1. Confusion, altered mental status  2. History of cerebrovascular disease  3. Diabetes  According to the family, there has been a gradual transition to an increasing level of confusion and hypersomnolence. Given the history of this patient, cerebrovascular disease needs to be excluded, but the history supports a metabolic issue, or possibly underlying depression or a sleep disorder. The patient will be set for MRI evaluation of the brain, and blood work will be done today. She will follow-up in 3 months.  Jill Alexanders MD 11/13/2015 7:37 PM  [facility] [address] [place], [place] [address]  Phone [phone] Fax [phone]

## 2015-11-13 NOTE — Patient Instructions (Addendum)
We will get blood work today, and get MRI evaluation of the head.   Stroke Prevention Some medical conditions and behaviors are associated with an increased chance of having a stroke. You may prevent a stroke by making healthy choices and managing medical conditions. HOW CAN I REDUCE MY RISK OF HAVING A STROKE?   Stay physically active. Get at least 30 minutes of activity on most or all days.  Do not smoke. It may also be helpful to avoid exposure to secondhand smoke.  Limit alcohol use. Moderate alcohol use is considered to be:  No more than 2 drinks per day for men.  No more than 1 drink per day for nonpregnant women.  Eat healthy foods. This involves:  Eating 5 or more servings of fruits and vegetables a day.  Making dietary changes that address high blood pressure (hypertension), high cholesterol, diabetes, or obesity.  Manage your cholesterol levels.  Making food choices that are high in fiber and low in saturated fat, trans fat, and cholesterol may control cholesterol levels.  Take any prescribed medicines to control cholesterol as directed by your health care provider.  Manage your diabetes.  Controlling your carbohydrate and sugar intake is recommended to manage diabetes.  Take any prescribed medicines to control diabetes as directed by your health care provider.  Control your hypertension.  Making food choices that are low in salt (sodium), saturated fat, trans fat, and cholesterol is recommended to manage hypertension.  Ask your health care provider if you need treatment to lower your blood pressure. Take any prescribed medicines to control hypertension as directed by your health care provider.  If you are 69-76 years of age, have your blood pressure checked every 3-5 years. If you are 14 years of age or older, have your blood pressure checked every year.  Maintain a healthy weight.  Reducing calorie intake and making food choices that are low in sodium,  saturated fat, trans fat, and cholesterol are recommended to manage weight.  Stop drug abuse.  Avoid taking birth control pills.  Talk to your health care provider about the risks of taking birth control pills if you are over 25 years old, smoke, get migraines, or have ever had a blood clot.  Get evaluated for sleep disorders (sleep apnea).  Talk to your health care provider about getting a sleep evaluation if you snore a lot or have excessive sleepiness.  Take medicines only as directed by your health care provider.  For some people, aspirin or blood thinners (anticoagulants) are helpful in reducing the risk of forming abnormal blood clots that can lead to stroke. If you have the irregular heart rhythm of atrial fibrillation, you should be on a blood thinner unless there is a good reason you cannot take them.  Understand all your medicine instructions.  Make sure that other conditions (such as anemia or atherosclerosis) are addressed. SEEK IMMEDIATE MEDICAL CARE IF:   You have sudden weakness or numbness of the face, arm, or leg, especially on one side of the body.  Your face or eyelid droops to one side.  You have sudden confusion.  You have trouble speaking (aphasia) or understanding.  You have sudden trouble seeing in one or both eyes.  You have sudden trouble walking.  You have dizziness.  You have a loss of balance or coordination.  You have a sudden, severe headache with no known cause.  You have new chest pain or an irregular heartbeat. Any of these symptoms may represent  a serious problem that is an emergency. Do not wait to see if the symptoms will go away. Get medical help at once. Call your local emergency services (911 in [place] Do not drive yourself to the hospital.   This information is not intended to replace advice given to you by your health care provider. Make sure you discuss any questions you have with your health care provider.   Document Released:  11/20/2004 Document Revised: 11/03/2014 Document Reviewed: 04/15/2013 Elsevier Interactive Patient Education [employer].

## 2015-11-13 NOTE — Telephone Encounter (Signed)
Husband called 1:49pm to advise wife is running late for 2pm New Patient appointment today with Dr. Jannifer Franklin, will be here in 10-13 minutes. Per Tonita Cong ok to go ahead and come for appointment.

## 2015-11-14 ENCOUNTER — Telehealth: Payer: Self-pay | Admitting: Neurology

## 2015-11-14 LAB — CBC WITH DIFFERENTIAL/PLATELET
BASOS ABS: 0.1 10*3/uL (ref 0.0–0.2)
Basos: 0 %
EOS (ABSOLUTE): 0 10*3/uL (ref 0.0–0.4)
Eos: 0 %
Hematocrit: 34.8 % (ref 34.0–46.6)
Hemoglobin: 11.1 g/dL (ref 11.1–15.9)
IMMATURE GRANS (ABS): 0.1 10*3/uL (ref 0.0–0.1)
IMMATURE GRANULOCYTES: 1 %
LYMPHS: 15 %
Lymphocytes Absolute: 2 10*3/uL (ref 0.7–3.1)
MCH: 27.2 pg (ref 26.6–33.0)
MCHC: 31.9 g/dL (ref 31.5–35.7)
MCV: 85 fL (ref 79–97)
MONOS ABS: 1.1 10*3/uL — AB (ref 0.1–0.9)
Monocytes: 8 %
NEUTROS PCT: 76 %
Neutrophils Absolute: 9.9 10*3/uL — ABNORMAL HIGH (ref 1.4–7.0)
PLATELETS: 497 10*3/uL — AB (ref 150–379)
RBC: 4.08 x10E6/uL (ref 3.77–5.28)
RDW: 16.3 % — AB (ref 12.3–15.4)
WBC: 13.1 10*3/uL — AB (ref 3.4–10.8)

## 2015-11-14 LAB — COPPER, SERUM: COPPER: 185 ug/dL — AB (ref 72–166)

## 2015-11-14 LAB — HIV ANTIBODY (ROUTINE TESTING W REFLEX): HIV SCREEN 4TH GENERATION: NONREACTIVE

## 2015-11-14 LAB — TSH: TSH: 2.23 u[IU]/mL (ref 0.450–4.500)

## 2015-11-14 LAB — COMPREHENSIVE METABOLIC PANEL
A/G RATIO: 0.8 — AB (ref 1.1–2.5)
ALK PHOS: 253 IU/L — AB (ref 39–117)
ALT: 11 IU/L (ref 0–32)
AST: 26 IU/L (ref 0–40)
Albumin: 3.6 g/dL (ref 3.6–4.8)
BUN/Creatinine Ratio: 9 — ABNORMAL LOW (ref 11–26)
BUN: 16 mg/dL (ref 8–27)
Bilirubin Total: 1.1 mg/dL (ref 0.0–1.2)
CALCIUM: 10.3 mg/dL (ref 8.7–10.3)
CHLORIDE: 93 mmol/L — AB (ref 96–106)
CO2: 20 mmol/L (ref 18–29)
Creatinine, Ser: 1.73 mg/dL — ABNORMAL HIGH (ref 0.57–1.00)
GFR calc Af Amer: 36 mL/min/{1.73_m2} — ABNORMAL LOW (ref >59)
GFR calc non Af Amer: 31 mL/min/{1.73_m2} — ABNORMAL LOW (ref >59)
GLOBULIN, TOTAL: 4.8 g/dL — AB (ref 1.5–4.5)
Glucose: 160 mg/dL — ABNORMAL HIGH (ref 65–99)
POTASSIUM: 5.1 mmol/L (ref 3.5–5.2)
SODIUM: 133 mmol/L — AB (ref 134–144)
Total Protein: 8.4 g/dL (ref 6.0–8.5)

## 2015-11-14 LAB — SEDIMENTATION RATE: Sed Rate: 42 mm/hr — ABNORMAL HIGH (ref 0–40)

## 2015-11-14 LAB — ENA+DNA/DS+SJORGEN'S: dsDNA Ab: 1 IU/mL (ref 0–9)

## 2015-11-14 LAB — RPR: RPR: NONREACTIVE

## 2015-11-14 LAB — AMMONIA: AMMONIA: 90 ug/dL — AB (ref 19–87)

## 2015-11-14 LAB — VITAMIN B12: VITAMIN B 12: 971 pg/mL — AB (ref 211–946)

## 2015-11-14 LAB — B. BURGDORFI ANTIBODIES: Lyme IgG/IgM Ab: <0.91 {ISR} (ref 0.00–0.90)

## 2015-11-14 LAB — ANGIOTENSIN CONVERTING ENZYME: Angio Convert Enzyme: 35 U/L (ref 14–82)

## 2015-11-14 LAB — ANA W/REFLEX: Anti Nuclear Antibody(ANA): POSITIVE — AB

## 2015-11-14 NOTE — Telephone Encounter (Signed)
I called the patient. Blood work done shows several things. The patient has a chronic stable elevation in the white blood count, her renal function seems to have deteriorated over the last 2 years, creatinine is 1.7. I will send the blood work reports to the primary doctor. The sedimentation rate is minimally elevated at 42. The ammonia level is minimally elevated at 90 , ANA is positive, but the panel was negative. Not sure these issues are clinically significant. Nothing obvious that would be causing her significant confusion. MRI of the brain is pending.

## 2015-11-21 ENCOUNTER — Ambulatory Visit
Admission: RE | Admit: 2015-11-21 | Discharge: 2015-11-21 | Disposition: A | Discharge status: Home / Self Care | Payer: Medicare (Managed Care) | Source: Ambulatory Visit | Attending: Neurology | Admitting: Neurology

## 2015-11-21 DIAGNOSIS — R410 Disorientation, unspecified: Secondary | ICD-10-CM

## 2015-11-21 DIAGNOSIS — I69398 Other sequelae of cerebral infarction: Principal | ICD-10-CM

## 2015-11-21 DIAGNOSIS — F05 Delirium due to known physiological condition: Secondary | ICD-10-CM

## 2015-11-21 DIAGNOSIS — I69359 Hemiplegia and hemiparesis following cerebral infarction affecting unspecified side: Secondary | ICD-10-CM

## 2015-11-22 ENCOUNTER — Telehealth: Payer: Self-pay | Admitting: Neurology

## 2015-11-22 DIAGNOSIS — I480 Paroxysmal atrial fibrillation: Secondary | ICD-10-CM

## 2015-11-22 DIAGNOSIS — I639 Cerebral infarction, unspecified: Secondary | ICD-10-CM

## 2015-11-22 NOTE — Telephone Encounter (Signed)
  I called the patient. The MRI of the brain shows a new CVA in the right occipital area. The patient denies any new symptoms since she was seen in the office, I will get her set up for a carotid doppler and a 2D echo, and 30 day cardiac monitor. She is to go to the ER if any new symptoms evolve.  MRI brain 11/22/15:  IMPRESSION:  Abnormal MRI brain (without) demonstrating: 1. Acute right occipital periventricular ischemic infarction. 2. Moderate periventricular and subcortical and pontine chronic small vessel ischemic disease.  3. Chronic bilateral basal ganglia, thalamic and pontine ischemic infarctions.  4. Mild diffuse atrophy. 5. Compared to MRI on 07/20/12, the acute right occipital ischemic infarction is a new finding. Also there has been progression of overall chronic small vessel ischemic disease.

## 2015-11-26 ENCOUNTER — Other Ambulatory Visit: Payer: Self-pay | Admitting: Neurology

## 2015-11-26 DIAGNOSIS — I480 Paroxysmal atrial fibrillation: Secondary | ICD-10-CM

## 2015-11-26 DIAGNOSIS — I639 Cerebral infarction, unspecified: Secondary | ICD-10-CM

## 2015-11-27 ENCOUNTER — Encounter: Payer: Self-pay | Admitting: *Deleted

## 2015-11-27 NOTE — Progress Notes (Signed)
Patient ID: [name], female   DOB: 02/21/1951, 65 y.o.   MRN: [id number] Patient did not show up for 11/27/2015, 12:30 PM, appointment to have a 30 day cardiac event monitor applied.

## 2015-12-05 ENCOUNTER — Telehealth: Payer: Self-pay | Admitting: Neurology

## 2015-12-05 ENCOUNTER — Encounter: Payer: Self-pay | Admitting: *Deleted

## 2015-12-05 NOTE — Progress Notes (Signed)
Patient ID: [name], female   DOB: 1951-01-07, 65 y.o.   MRN: [id number] Patient did not show up for 12/05/2015, 2:00 PM, appointment to have a 30 day cardiac event monitor applied.

## 2015-12-10 ENCOUNTER — Ambulatory Visit (INDEPENDENT_AMBULATORY_CARE_PROVIDER_SITE_OTHER): Payer: Medicare (Managed Care)

## 2015-12-10 ENCOUNTER — Telehealth: Payer: Self-pay | Admitting: Neurology

## 2015-12-10 DIAGNOSIS — I480 Paroxysmal atrial fibrillation: Secondary | ICD-10-CM

## 2015-12-10 DIAGNOSIS — I639 Cerebral infarction, unspecified: Secondary | ICD-10-CM

## 2015-12-10 NOTE — Telephone Encounter (Signed)
Patient's husband is calling and states that he is having a lot of problems with his wife's behavior.  He states she stays in her room and watches TV and will not get dressed for appointments, etc.  He is wondering if she needs medication for her behavior.  Please call.

## 2015-12-10 NOTE — Telephone Encounter (Signed)
I called the husband, left a message. The patient is having some problems with social withdrawal. The patient may be having problems with depression. I have noted that the patient is on Celexa and Effexor, either one of these medications may be increased , they are to contact the doctors prescribing the medication to see if they would increase the dosing.

## 2016-01-22 ENCOUNTER — Telehealth: Payer: Self-pay | Admitting: Neurology

## 2016-01-22 NOTE — Telephone Encounter (Signed)
I called patient, talk with the son. The cardiac event monitor was unremarkable. The son indicates that the patient has refused to take anymore medications, she remains somewhat sleepy throughout the day. She may require home health nursing to help manage the medications.   Cardiac event monitor 01/22/16:  Sinus rhythm No atrial fibrillation No sustained arrhythmias

## 2016-01-30 ENCOUNTER — Encounter: Payer: Self-pay | Admitting: Neurology

## 2016-01-30 ENCOUNTER — Ambulatory Visit (INDEPENDENT_AMBULATORY_CARE_PROVIDER_SITE_OTHER): Payer: Medicare Other | Admitting: Neurology

## 2016-01-30 VITALS — BP 133/85 | HR 111 | Ht 61.0 in | Wt 159.0 lb

## 2016-01-30 DIAGNOSIS — E1149 Type 2 diabetes mellitus with other diabetic neurological complication: Secondary | ICD-10-CM | POA: Diagnosis not present

## 2016-01-30 DIAGNOSIS — I639 Cerebral infarction, unspecified: Secondary | ICD-10-CM

## 2016-01-30 DIAGNOSIS — Z5181 Encounter for therapeutic drug level monitoring: Secondary | ICD-10-CM | POA: Diagnosis not present

## 2016-01-30 HISTORY — DX: Cerebral infarction, unspecified: I63.9

## 2016-01-30 NOTE — Patient Instructions (Signed)
Stroke Prevention Some medical conditions and behaviors are associated with an increased chance of having a stroke. You may prevent a stroke by making healthy choices and managing medical conditions. HOW CAN I REDUCE MY RISK OF HAVING A STROKE?   Stay physically active. Get at least 30 minutes of activity on most or all days.  Do not smoke. It may also be helpful to avoid exposure to secondhand smoke.  Limit alcohol use. Moderate alcohol use is considered to be:  No more than 2 drinks per day for men.  No more than 1 drink per day for nonpregnant women.  Eat healthy foods. This involves:  Eating 5 or more servings of fruits and vegetables a day.  Making dietary changes that address high blood pressure (hypertension), high cholesterol, diabetes, or obesity.  Manage your cholesterol levels.  Making food choices that are high in fiber and low in saturated fat, trans fat, and cholesterol may control cholesterol levels.  Take any prescribed medicines to control cholesterol as directed by your health care provider.  Manage your diabetes.  Controlling your carbohydrate and sugar intake is recommended to manage diabetes.  Take any prescribed medicines to control diabetes as directed by your health care provider.  Control your hypertension.  Making food choices that are low in salt (sodium), saturated fat, trans fat, and cholesterol is recommended to manage hypertension.  Ask your health care provider if you need treatment to lower your blood pressure. Take any prescribed medicines to control hypertension as directed by your health care provider.  If you are 18-39 years of age, have your blood pressure checked every 3-5 years. If you are 40 years of age or older, have your blood pressure checked every year.  Maintain a healthy weight.  Reducing calorie intake and making food choices that are low in sodium, saturated fat, trans fat, and cholesterol are recommended to manage  weight.  Stop drug abuse.  Avoid taking birth control pills.  Talk to your health care provider about the risks of taking birth control pills if you are over 35 years old, smoke, get migraines, or have ever had a blood clot.  Get evaluated for sleep disorders (sleep apnea).  Talk to your health care provider about getting a sleep evaluation if you snore a lot or have excessive sleepiness.  Take medicines only as directed by your health care provider.  For some people, aspirin or blood thinners (anticoagulants) are helpful in reducing the risk of forming abnormal blood clots that can lead to stroke. If you have the irregular heart rhythm of atrial fibrillation, you should be on a blood thinner unless there is a good reason you cannot take them.  Understand all your medicine instructions.  Make sure that other conditions (such as anemia or atherosclerosis) are addressed. SEEK IMMEDIATE MEDICAL CARE IF:   You have sudden weakness or numbness of the face, arm, or leg, especially on one side of the body.  Your face or eyelid droops to one side.  You have sudden confusion.  You have trouble speaking (aphasia) or understanding.  You have sudden trouble seeing in one or both eyes.  You have sudden trouble walking.  You have dizziness.  You have a loss of balance or coordination.  You have a sudden, severe headache with no known cause.  You have new chest pain or an irregular heartbeat. Any of these symptoms may represent a serious problem that is an emergency. Do not wait to see if the symptoms will   go away. Get medical help at once. Call your local emergency services (911 in [place] Do not drive yourself to the hospital.   This information is not intended to replace advice given to you by your health care provider. Make sure you discuss any questions you have with your health care provider.   Document Released: 11/20/2004 Document Revised: 11/03/2014 Document Reviewed:  04/15/2013 Elsevier Interactive Patient Education 2016 Elsevier [employer]  

## 2016-01-30 NOTE — Progress Notes (Signed)
Reason for visit: Stroke  [name] is an 65 y.o. female  History of present illness:  [name] is a 65 year old left-handed black female with a history of diabetes and hypertension and cerebrovascular disease. 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 The patient presented in January with a 4 month history of some changes in her functional level with increased confusion, hypersomnolence, and withdrawal. The patient underwent a MRI of the brain that showed a right occipital white matter stroke event that was recent. The patient has fairly extensive small vessel changes in the periventricular areas, and evidence of chronic right greater than left pontine strokes. The patient has undergone a cardiac monitor study that was unremarkable. She was set up for a carotid Doppler study and a 2-D echocardiogram, but these studies were never done. The patient is noncompliant with her medications, she is not taking her blood pressure medications, diabetes medications, she is not on her Plavix. She does not eat a lot, the blood sugar checks have been unremarkable according to the family. She is somewhat better with her problems with social withdrawal, but she does not want to go out of the house. She sleeps 15 or 16 hours a day. The memory issues remain a problem. She returns to the office today for an evaluation. There have been no new episodes of numbness, weakness, vision changes, speech changes, or swallowing problems.  Past Medical History  Diagnosis Date  . Hypertension   . Diabetes mellitus   . Depression   . Hyperlipidemia   . Bronchitis   . SOB (shortness of breath)   . Arthritis   . Dizziness   . Weakness   . Sore throat   . Sinus problem   . Family history of breast cancer   . Heart attack [facility]) 2004    uses to see Dr Vidal Schwalbe- doesnt see anyone since  . Stroke [facility])     left side weaker/ uses a walker, uses most of time.  . Chronic kidney disease      possible stones - confirm with patient  . Renal cancer ([place])   . Sleep apnea     study done around 05, hasnt uesd in a whiloe  . Hemiparesis and alteration of sensations as late effects of stroke ([place]) 11/13/2015  . Occipital stroke ([place]) 01/30/2016    Past Surgical History  Procedure Laterality Date  . Spleen removal  2001  . Laparoscopic gastric banding  05/09/2008  . Abdominal hysterectomy  2002  . Cholecystectomy  1999  . Nephrectomy  2001    due to cancer  . Carpal tunnel release    .  fracture achilles Left 12/29/2012    achilles calcaneal fracture      Dr Sharol Given  . Achilles tendon surgery Left 12/29/2012    Procedure: ACHILLES TENDON REPAIR;  Surgeon: Newt Minion, MD;  Location: [place];  Service: Orthopedics;  Laterality: Left;  Resection Left Calcaneal fracture and repair Achilles    Family History  Problem Relation Age of Onset  . Diabetes Father   . Alzheimer's disease Father   . Kidney cancer Father   . Hypertension Father   . Kidney disease Father     kidney removal  . Hypertension Mother   . Arthritis Mother   . Stroke Brother   . Diabetes Brother   . Hypertension Brother   . Diabetes Sister   . Thyroid disease Sister   . Hyperlipidemia Sister   .  Other Sister     thyroidectomy    Social history:  reports that she has never smoked. She has never used smokeless tobacco. She reports that she does not drink alcohol or use illicit drugs.    Allergies  Allergen Reactions  . Iohexol      Code: HIVES, Desc: Pt states in 2005 w/ a heart cath, she broke out in large hives and a rash.  She was given 50mg  benadryl, po.  She tolerated the procedure well w/o complication.  Thanks., Onset Date: [id number]     Medications:  Prior to Admission medications   Medication Sig Start Date End Date Taking? Authorizing Provider  atorvastatin (LIPITOR) 20 MG tablet Take 20 mg by mouth daily at 6 PM. Reported on 01/30/2016 07/22/12   Sorin June Leap, MD  Cholecalciferol (VITAMIN D3)  50000 UNITS CAPS Take 1 capsule by mouth once a week. Every Sunday. Patient not taking: Reported on 01/30/2016 01/12/13   Ripudeep Krystal Eaton, MD  citalopram (CELEXA) 10 MG tablet Take 10 mg by mouth daily. Reported on 01/30/2016    Historical Provider, MD  clopidogrel (PLAVIX) 75 MG tablet Take 75 mg by mouth at bedtime. Reported on 01/30/2016    Historical Provider, MD  hydrochlorothiazide (HYDRODIURIL) 25 MG tablet Take 25 mg by mouth daily. Reported on 01/30/2016 12/03/12   Aundria Mems, PA-C  losartan (COZAAR) 100 MG tablet Take 100 mg by mouth at bedtime. Reported on 01/30/2016    Historical Provider, MD  metFORMIN (GLUCOPHAGE) 500 MG tablet Take 500 mg by mouth 2 (two) times daily with a meal. Reported on 01/30/2016 08/06/12   Ivan Anchors Love, PA-C  metoprolol (LOPRESSOR) 50 MG tablet Take 1.5 tablets (75 mg total) by mouth 2 (two) times daily. Patient not taking: Reported on 01/30/2016 01/12/13   Ripudeep Krystal Eaton, MD  omeprazole (PRILOSEC) 20 MG capsule Take 20 mg by mouth daily. Reported on 01/30/2016 09/29/15   Historical Provider, MD  oxybutynin (DITROPAN-XL) 10 MG 24 hr tablet Take 10 mg by mouth daily. Reported on 01/30/2016 10/03/15   Historical Provider, MD  PERCOCET 5-325 MG per tablet Take 1 tablet by mouth every 4 (four) hours as needed for pain. Patient not taking: Reported on 01/30/2016 01/12/13   Ripudeep Krystal Eaton, MD  venlafaxine XR (EFFEXOR-XR) 150 MG 24 hr capsule Take 1 capsule (150 mg total) by mouth at bedtime. Patient not taking: Reported on 01/30/2016 01/12/13   Ripudeep Krystal Eaton, MD  Vitamin D, Ergocalciferol, (DRISDOL) 50000 units CAPS capsule Reported on 01/30/2016 09/29/15   Historical Provider, MD    ROS:  Out of a complete 14 system review of symptoms, the patient complains only of the following symptoms, and all other reviewed systems are negative.  Memory disturbance Mild gait disturbance  Blood pressure 133/85, pulse 111, height 5\' 1"  (1.549 m), weight 159 lb (72.122 kg), last menstrual period  12/28/2012.  Physical Exam  General: The patient is alert and cooperative at the time of the examination.  Skin: No significant peripheral edema is noted.   Neurologic Exam  Mental status: The patient is alert and oriented x 2 at the time of the examination (not oriented to date). The Mini-Mental Status Examination done today shows a total score of 18/30. The patient is able to name 2 animals in 30 seconds.   Cranial nerves: Facial symmetry is present. Speech is normal, no aphasia or dysarthria is noted. Extraocular movements are full. Visual fields are full.  Motor: The patient has good  strength in all 4 extremities.  Sensory examination: Soft touch sensation is symmetric on the face, arms, and legs.  Coordination: The patient has good finger-nose-finger and heel-to-shin bilaterally.  Gait and station: The patient has a normal gait. Tandem gait is unsteady. Romberg is negative. No drift is seen.  Reflexes: Deep tendon reflexes are symmetric.   Assessment/Plan:  1. Cerebrovascular disease, recent right occipital white matter stroke  2. Memory disturbance  3. Mild gait disturbance  The patient is on antidepressants, her issues with withdrawal have improved somewhat. The patient is noncompliant with her medications, I have instructed her to get back on her Plavix. We will check blood work today. The patient will follow-up in 6 months, sooner if needed. The carotid Doppler study and the 2-D echocardiogram will be reordered.  Jill Alexanders MD 01/30/2016 6:42 PM  [facility] [address] [place], [place] [address]  Phone [phone] Fax [phone]

## 2016-01-31 ENCOUNTER — Telehealth: Payer: Self-pay | Admitting: Neurology

## 2016-01-31 LAB — COMPREHENSIVE METABOLIC PANEL
ALK PHOS: 192 IU/L — AB (ref 39–117)
ALT: 9 IU/L (ref 0–32)
AST: 28 IU/L (ref 0–40)
Albumin/Globulin Ratio: 0.8 — ABNORMAL LOW (ref 1.2–2.2)
Albumin: 3.1 g/dL — ABNORMAL LOW (ref 3.6–4.8)
BUN/Creatinine Ratio: 12 (ref 12–28)
BUN: 12 mg/dL (ref 8–27)
Bilirubin Total: 0.8 mg/dL (ref 0.0–1.2)
CALCIUM: 9.3 mg/dL (ref 8.7–10.3)
CO2: 22 mmol/L (ref 18–29)
CREATININE: 1.04 mg/dL — AB (ref 0.57–1.00)
Chloride: 94 mmol/L — ABNORMAL LOW (ref 96–106)
GFR calc Af Amer: 66 mL/min/{1.73_m2} (ref >59)
GFR, EST NON AFRICAN AMERICAN: 57 mL/min/{1.73_m2} — AB (ref >59)
GLOBULIN, TOTAL: 4 g/dL (ref 1.5–4.5)
GLUCOSE: 155 mg/dL — AB (ref 65–99)
Potassium: 4.7 mmol/L (ref 3.5–5.2)
SODIUM: 133 mmol/L — AB (ref 134–144)
Total Protein: 7.1 g/dL (ref 6.0–8.5)

## 2016-01-31 LAB — CBC WITH DIFFERENTIAL/PLATELET
BASOS: 1 %
Basophils Absolute: 0.1 10*3/uL (ref 0.0–0.2)
EOS (ABSOLUTE): 0.1 10*3/uL (ref 0.0–0.4)
EOS: 1 %
HEMATOCRIT: 32.2 % — AB (ref 34.0–46.6)
Hemoglobin: 10.2 g/dL — ABNORMAL LOW (ref 11.1–15.9)
IMMATURE GRANS (ABS): 0.1 10*3/uL (ref 0.0–0.1)
IMMATURE GRANULOCYTES: 1 %
LYMPHS: 18 %
Lymphocytes Absolute: 2.2 10*3/uL (ref 0.7–3.1)
MCH: 28.6 pg (ref 26.6–33.0)
MCHC: 31.7 g/dL (ref 31.5–35.7)
MCV: 90 fL (ref 79–97)
MONOCYTES: 11 %
MONOS ABS: 1.4 10*3/uL — AB (ref 0.1–0.9)
NEUTROS PCT: 68 %
Neutrophils Absolute: 8.4 10*3/uL — ABNORMAL HIGH (ref 1.4–7.0)
Platelets: 507 10*3/uL — ABNORMAL HIGH (ref 150–379)
RBC: 3.57 x10E6/uL — AB (ref 3.77–5.28)
RDW: 17.3 % — AB (ref 12.3–15.4)
WBC: 12.2 10*3/uL — AB (ref 3.4–10.8)

## 2016-01-31 LAB — HEMOGLOBIN A1C
ESTIMATED AVERAGE GLUCOSE: 128 mg/dL
Hgb A1c MFr Bld: 6.1 % — ABNORMAL HIGH (ref 4.8–5.6)

## 2016-01-31 NOTE — Telephone Encounter (Signed)
I called the patient, talk with the caretaker. The blood work shows a slightly low sodium level, 133. Composite case is elevated, but improved from the last check. The patient has a mild anemia, she runs a slightly high white blood count, improved from last evaluation. Hemoglobin A1c is 6.1, the patient is not taking metformin, but she is not eating well either. Her blood pressures have been fairly unremarkable off of her blood pressure medications. The platelet level remains elevated. I discussed the results with the caretaker. The patient has some mild chronic renal insufficiency, actually improved from last check.

## 2016-02-13 ENCOUNTER — Other Ambulatory Visit: Payer: Medicare Other

## 2016-02-19 ENCOUNTER — Ambulatory Visit: Payer: Medicare Other | Admitting: Neurology

## 2016-02-26 ENCOUNTER — Telehealth: Payer: Self-pay | Admitting: Neurology

## 2016-02-26 ENCOUNTER — Other Ambulatory Visit: Payer: Self-pay | Admitting: Neurology

## 2016-02-26 DIAGNOSIS — I639 Cerebral infarction, unspecified: Secondary | ICD-10-CM

## 2016-02-26 MED ORDER — SERTRALINE HCL 20 MG/ML PO CONC: 50.0000 mg | Freq: Every day | ORAL | Status: DC

## 2016-02-26 NOTE — Telephone Encounter (Signed)
I called the husband. The patient has become more withdrawn, she is not taking any of her medications, she is off the Celexa and the Effexor. She is not eating well. We will restart Zoloft in a liquid form taking 50 mg daily, may go up on the dose from there. The depression seems to have gotten much worse.

## 2016-02-26 NOTE — Telephone Encounter (Signed)
[facility] called. Zoloft ordered as liquid. Patient requested tablets instead of liquid. No liquid available at pharmacy. I approved the change.

## 2016-02-26 NOTE — Telephone Encounter (Signed)
Husband called, states he's concerned about wife, "she's just nibbling on food, not saying much, it's like she's weak, had hard time getting out of bed the past couple of days. Is there anything Dr. Jannifer Franklin can prescribe that will bring her back to life? She continues sleeping 70-80% of the day, her mood is not even close to being herself, she hasn't been taking her medication for a while".

## 2016-03-02 ENCOUNTER — Encounter (HOSPITAL_COMMUNITY): Payer: Self-pay

## 2016-03-02 ENCOUNTER — Emergency Department (HOSPITAL_COMMUNITY): Payer: Medicare Other

## 2016-03-02 ENCOUNTER — Observation Stay (HOSPITAL_COMMUNITY): Payer: Medicare Other

## 2016-03-02 ENCOUNTER — Inpatient Hospital Stay (HOSPITAL_COMMUNITY)
Admission: EM | Admit: 2016-03-02 | Discharge: 2016-03-08 | DRG: 374 | Disposition: A | Discharge status: Hospice | Payer: Medicare Other | Attending: Internal Medicine | Admitting: Internal Medicine

## 2016-03-02 DIAGNOSIS — Z85528 Personal history of other malignant neoplasm of kidney: Secondary | ICD-10-CM

## 2016-03-02 DIAGNOSIS — E119 Type 2 diabetes mellitus without complications: Secondary | ICD-10-CM

## 2016-03-02 DIAGNOSIS — R630 Anorexia: Secondary | ICD-10-CM | POA: Diagnosis present

## 2016-03-02 DIAGNOSIS — Z9119 Patient's noncompliance with other medical treatment and regimen: Secondary | ICD-10-CM

## 2016-03-02 DIAGNOSIS — Z841 Family history of disorders of kidney and ureter: Secondary | ICD-10-CM | POA: Diagnosis not present

## 2016-03-02 DIAGNOSIS — C185 Malignant neoplasm of splenic flexure: Principal | ICD-10-CM | POA: Diagnosis present

## 2016-03-02 DIAGNOSIS — I6789 Other cerebrovascular disease: Secondary | ICD-10-CM | POA: Diagnosis not present

## 2016-03-02 DIAGNOSIS — E860 Dehydration: Secondary | ICD-10-CM | POA: Diagnosis present

## 2016-03-02 DIAGNOSIS — G4710 Hypersomnia, unspecified: Secondary | ICD-10-CM | POA: Diagnosis not present

## 2016-03-02 DIAGNOSIS — C785 Secondary malignant neoplasm of large intestine and rectum: Secondary | ICD-10-CM | POA: Diagnosis not present

## 2016-03-02 DIAGNOSIS — Z9884 Bariatric surgery status: Secondary | ICD-10-CM | POA: Diagnosis not present

## 2016-03-02 DIAGNOSIS — I69354 Hemiplegia and hemiparesis following cerebral infarction affecting left non-dominant side: Secondary | ICD-10-CM | POA: Diagnosis not present

## 2016-03-02 DIAGNOSIS — Z8051 Family history of malignant neoplasm of kidney: Secondary | ICD-10-CM

## 2016-03-02 DIAGNOSIS — Z66 Do not resuscitate: Secondary | ICD-10-CM | POA: Diagnosis not present

## 2016-03-02 DIAGNOSIS — C786 Secondary malignant neoplasm of retroperitoneum and peritoneum: Secondary | ICD-10-CM | POA: Diagnosis present

## 2016-03-02 DIAGNOSIS — D72829 Elevated white blood cell count, unspecified: Secondary | ICD-10-CM | POA: Insufficient documentation

## 2016-03-02 DIAGNOSIS — C787 Secondary malignant neoplasm of liver and intrahepatic bile duct: Secondary | ICD-10-CM | POA: Diagnosis not present

## 2016-03-02 DIAGNOSIS — R627 Adult failure to thrive: Secondary | ICD-10-CM | POA: Diagnosis present

## 2016-03-02 DIAGNOSIS — C189 Malignant neoplasm of colon, unspecified: Secondary | ICD-10-CM | POA: Diagnosis not present

## 2016-03-02 DIAGNOSIS — R1900 Intra-abdominal and pelvic swelling, mass and lump, unspecified site: Secondary | ICD-10-CM | POA: Insufficient documentation

## 2016-03-02 DIAGNOSIS — Z803 Family history of malignant neoplasm of breast: Secondary | ICD-10-CM

## 2016-03-02 DIAGNOSIS — R6251 Failure to thrive (child): Secondary | ICD-10-CM | POA: Diagnosis present

## 2016-03-02 DIAGNOSIS — Z905 Acquired absence of kidney: Secondary | ICD-10-CM | POA: Diagnosis not present

## 2016-03-02 DIAGNOSIS — I10 Essential (primary) hypertension: Secondary | ICD-10-CM | POA: Diagnosis present

## 2016-03-02 DIAGNOSIS — Z7401 Bed confinement status: Secondary | ICD-10-CM | POA: Diagnosis not present

## 2016-03-02 DIAGNOSIS — Z9114 Patient's other noncompliance with medication regimen: Secondary | ICD-10-CM | POA: Diagnosis not present

## 2016-03-02 DIAGNOSIS — G4730 Sleep apnea, unspecified: Secondary | ICD-10-CM | POA: Diagnosis present

## 2016-03-02 DIAGNOSIS — Z90710 Acquired absence of both cervix and uterus: Secondary | ICD-10-CM | POA: Diagnosis not present

## 2016-03-02 DIAGNOSIS — F0150 Vascular dementia without behavioral disturbance: Secondary | ICD-10-CM | POA: Diagnosis present

## 2016-03-02 DIAGNOSIS — R531 Weakness: Secondary | ICD-10-CM | POA: Diagnosis not present

## 2016-03-02 DIAGNOSIS — Z8249 Family history of ischemic heart disease and other diseases of the circulatory system: Secondary | ICD-10-CM

## 2016-03-02 DIAGNOSIS — Z833 Family history of diabetes mellitus: Secondary | ICD-10-CM

## 2016-03-02 DIAGNOSIS — Z515 Encounter for palliative care: Secondary | ICD-10-CM | POA: Diagnosis present

## 2016-03-02 DIAGNOSIS — Z823 Family history of stroke: Secondary | ICD-10-CM | POA: Diagnosis not present

## 2016-03-02 DIAGNOSIS — E785 Hyperlipidemia, unspecified: Secondary | ICD-10-CM | POA: Diagnosis present

## 2016-03-02 DIAGNOSIS — Z9049 Acquired absence of other specified parts of digestive tract: Secondary | ICD-10-CM | POA: Diagnosis not present

## 2016-03-02 DIAGNOSIS — I639 Cerebral infarction, unspecified: Secondary | ICD-10-CM

## 2016-03-02 DIAGNOSIS — E1122 Type 2 diabetes mellitus with diabetic chronic kidney disease: Secondary | ICD-10-CM | POA: Diagnosis present

## 2016-03-02 DIAGNOSIS — Z91041 Radiographic dye allergy status: Secondary | ICD-10-CM | POA: Diagnosis not present

## 2016-03-02 DIAGNOSIS — E43 Unspecified severe protein-calorie malnutrition: Secondary | ICD-10-CM | POA: Diagnosis present

## 2016-03-02 DIAGNOSIS — Z9081 Acquired absence of spleen: Secondary | ICD-10-CM | POA: Diagnosis not present

## 2016-03-02 DIAGNOSIS — E118 Type 2 diabetes mellitus with unspecified complications: Secondary | ICD-10-CM | POA: Diagnosis not present

## 2016-03-02 DIAGNOSIS — N179 Acute kidney failure, unspecified: Secondary | ICD-10-CM | POA: Diagnosis present

## 2016-03-02 DIAGNOSIS — Z6828 Body mass index (BMI) 28.0-28.9, adult: Secondary | ICD-10-CM

## 2016-03-02 DIAGNOSIS — I252 Old myocardial infarction: Secondary | ICD-10-CM

## 2016-03-02 DIAGNOSIS — Z7189 Other specified counseling: Secondary | ICD-10-CM | POA: Insufficient documentation

## 2016-03-02 LAB — URINALYSIS, ROUTINE W REFLEX MICROSCOPIC
Glucose, UA: NEGATIVE mg/dL
HGB URINE DIPSTICK: NEGATIVE
Ketones, ur: 15 mg/dL — AB
Leukocytes, UA: NEGATIVE
Nitrite: NEGATIVE
Protein, ur: 30 mg/dL — AB
SPECIFIC GRAVITY, URINE: 1.02 (ref 1.005–1.030)
pH: 5.5 (ref 5.0–8.0)

## 2016-03-02 LAB — COMPREHENSIVE METABOLIC PANEL
ALT: 17 U/L (ref 14–54)
ANION GAP: 16 — AB (ref 5–15)
AST: 98 U/L — ABNORMAL HIGH (ref 15–41)
Albumin: 2.8 g/dL — ABNORMAL LOW (ref 3.5–5.0)
Alkaline Phosphatase: 189 U/L — ABNORMAL HIGH (ref 38–126)
BUN: 22 mg/dL — ABNORMAL HIGH (ref 6–20)
CHLORIDE: 97 mmol/L — AB (ref 101–111)
CO2: 21 mmol/L — AB (ref 22–32)
CREATININE: 1.39 mg/dL — AB (ref 0.44–1.00)
Calcium: 9.9 mg/dL (ref 8.9–10.3)
GFR calc non Af Amer: 39 mL/min — ABNORMAL LOW (ref >60)
GFR, EST AFRICAN AMERICAN: 46 mL/min — AB (ref >60)
Glucose, Bld: 161 mg/dL — ABNORMAL HIGH (ref 65–99)
Potassium: 4.2 mmol/L (ref 3.5–5.1)
SODIUM: 134 mmol/L — AB (ref 135–145)
Total Bilirubin: 1.7 mg/dL — ABNORMAL HIGH (ref 0.3–1.2)
Total Protein: 7.8 g/dL (ref 6.5–8.1)

## 2016-03-02 LAB — DIFFERENTIAL
BASOS PCT: 0 %
Basophils Absolute: 0 10*3/uL (ref 0.0–0.1)
EOS PCT: 0 %
Eosinophils Absolute: 0 10*3/uL (ref 0.0–0.7)
Lymphocytes Relative: 8 %
Lymphs Abs: 1.8 10*3/uL (ref 0.7–4.0)
MONO ABS: 2.7 10*3/uL — AB (ref 0.1–1.0)
Monocytes Relative: 12 %
NEUTROS ABS: 18.5 10*3/uL — AB (ref 1.7–7.7)
Neutrophils Relative %: 80 %

## 2016-03-02 LAB — I-STAT CHEM 8, ED
BUN: 25 mg/dL — ABNORMAL HIGH (ref 6–20)
CALCIUM ION: 1.21 mmol/L (ref 1.13–1.30)
CHLORIDE: 100 mmol/L — AB (ref 101–111)
Creatinine, Ser: 1.3 mg/dL — ABNORMAL HIGH (ref 0.44–1.00)
Glucose, Bld: 161 mg/dL — ABNORMAL HIGH (ref 65–99)
HEMATOCRIT: 40 % (ref 36.0–46.0)
Hemoglobin: 13.6 g/dL (ref 12.0–15.0)
POTASSIUM: 4.2 mmol/L (ref 3.5–5.1)
SODIUM: 135 mmol/L (ref 135–145)
TCO2: 24 mmol/L (ref 0–100)

## 2016-03-02 LAB — RAPID URINE DRUG SCREEN, HOSP PERFORMED
Amphetamines: NOT DETECTED
BENZODIAZEPINES: NOT DETECTED
Barbiturates: NOT DETECTED
Cocaine: NOT DETECTED
Opiates: NOT DETECTED
Tetrahydrocannabinol: NOT DETECTED

## 2016-03-02 LAB — GLUCOSE, CAPILLARY: GLUCOSE-CAPILLARY: 127 mg/dL — AB (ref 65–99)

## 2016-03-02 LAB — CBC
HCT: 33.7 % — ABNORMAL LOW (ref 36.0–46.0)
Hemoglobin: 11 g/dL — ABNORMAL LOW (ref 12.0–15.0)
MCH: 28.4 pg (ref 26.0–34.0)
MCHC: 32.6 g/dL (ref 30.0–36.0)
MCV: 86.9 fL (ref 78.0–100.0)
PLATELETS: 365 10*3/uL (ref 150–400)
RBC: 3.88 MIL/uL (ref 3.87–5.11)
RDW: 17.5 % — AB (ref 11.5–15.5)
WBC: 23.1 10*3/uL — ABNORMAL HIGH (ref 4.0–10.5)

## 2016-03-02 LAB — APTT: aPTT: 28 seconds (ref 24–37)

## 2016-03-02 LAB — URINE MICROSCOPIC-ADD ON
BACTERIA UA: NONE SEEN
RBC / HPF: NONE SEEN RBC/hpf (ref 0–5)
WBC UA: NONE SEEN WBC/hpf (ref 0–5)

## 2016-03-02 LAB — PROTIME-INR
INR: 1.2 (ref 0.00–1.49)
PROTHROMBIN TIME: 15.4 s — AB (ref 11.6–15.2)

## 2016-03-02 LAB — ETHANOL

## 2016-03-02 LAB — I-STAT TROPONIN, ED: TROPONIN I, POC: 0.02 ng/mL (ref 0.00–0.08)

## 2016-03-02 MED ORDER — ALBUTEROL SULFATE (2.5 MG/3ML) 0.083% IN NEBU: 2.5000 mg | INHALATION_SOLUTION | RESPIRATORY_TRACT | Status: DC | PRN

## 2016-03-02 MED ORDER — ONDANSETRON HCL 4 MG PO TABS: 4.0000 mg | ORAL_TABLET | Freq: Four times a day (QID) | ORAL | Status: DC | PRN

## 2016-03-02 MED ORDER — ACETAMINOPHEN 650 MG RE SUPP: 650.0000 mg | Freq: Four times a day (QID) | RECTAL | Status: DC | PRN

## 2016-03-02 MED ORDER — SODIUM CHLORIDE 0.9 % IV SOLN
INTRAVENOUS | Status: DC
  Administered 2016-03-02 – 2016-03-04 (×3): via INTRAVENOUS
  Administered 2016-03-07: 500 mL via INTRAVENOUS

## 2016-03-02 MED ORDER — ENOXAPARIN SODIUM 40 MG/0.4ML ~~LOC~~ SOLN
40.0000 mg | SUBCUTANEOUS | Status: DC
  Administered 2016-03-02 – 2016-03-05 (×4): 40 mg via SUBCUTANEOUS
  Filled 2016-03-02 (×4): qty 0.4

## 2016-03-02 MED ORDER — SODIUM CHLORIDE 0.9% FLUSH
3.0000 mL | Freq: Two times a day (BID) | INTRAVENOUS | Status: DC
  Administered 2016-03-02 – 2016-03-03 (×2): 3 mL via INTRAVENOUS
  Administered 2016-03-04: 10 mL via INTRAVENOUS
  Administered 2016-03-05 – 2016-03-07 (×2): 3 mL via INTRAVENOUS

## 2016-03-02 MED ORDER — CLOPIDOGREL BISULFATE 75 MG PO TABS
75.0000 mg | ORAL_TABLET | Freq: Every day | ORAL | Status: DC
  Administered 2016-03-02 – 2016-03-08 (×7): 75 mg via ORAL
  Filled 2016-03-02 (×7): qty 1

## 2016-03-02 MED ORDER — HYDRALAZINE HCL 20 MG/ML IJ SOLN
10.0000 mg | Freq: Four times a day (QID) | INTRAMUSCULAR | Status: DC | PRN
  Administered 2016-03-07: 10 mg via INTRAVENOUS
  Filled 2016-03-02: qty 1

## 2016-03-02 MED ORDER — ONDANSETRON HCL 4 MG/2ML IJ SOLN: 4.0000 mg | Freq: Four times a day (QID) | INTRAMUSCULAR | Status: DC | PRN

## 2016-03-02 MED ORDER — SERTRALINE HCL 50 MG PO TABS
50.0000 mg | ORAL_TABLET | Freq: Every day | ORAL | Status: DC
  Administered 2016-03-02 – 2016-03-08 (×5): 50 mg via ORAL
  Filled 2016-03-02 (×5): qty 1

## 2016-03-02 MED ORDER — POLYETHYLENE GLYCOL 3350 17 G PO PACK
17.0000 g | PACK | Freq: Every day | ORAL | Status: DC | PRN
Start: 2016-03-02 — End: 2016-03-08

## 2016-03-02 MED ORDER — SODIUM CHLORIDE 0.9 % IV BOLUS (SEPSIS): 500.0000 mL | Freq: Once | INTRAVENOUS | Status: DC

## 2016-03-02 MED ORDER — ACETAMINOPHEN 325 MG PO TABS
650.0000 mg | ORAL_TABLET | Freq: Four times a day (QID) | ORAL | Status: DC | PRN
  Administered 2016-03-05: 650 mg via ORAL
  Filled 2016-03-02: qty 2

## 2016-03-02 MED ORDER — SODIUM CHLORIDE 0.9 % IV BOLUS (SEPSIS)
500.0000 mL | Freq: Once | INTRAVENOUS | Status: AC
  Administered 2016-03-02: 500 mL via INTRAVENOUS

## 2016-03-02 MED ORDER — INSULIN ASPART 100 UNIT/ML ~~LOC~~ SOLN
0.0000 [IU] | Freq: Three times a day (TID) | SUBCUTANEOUS | Status: DC
Start: 2016-03-03 — End: 2016-03-08
  Administered 2016-03-03 (×2): 2 [IU] via SUBCUTANEOUS
  Administered 2016-03-03: 1 [IU] via SUBCUTANEOUS
  Administered 2016-03-05 – 2016-03-06 (×2): 2 [IU] via SUBCUTANEOUS
  Administered 2016-03-07: 7 [IU] via SUBCUTANEOUS
  Administered 2016-03-08: 3 [IU] via SUBCUTANEOUS

## 2016-03-02 NOTE — ED Provider Notes (Signed)
CSN: [id number]     Arrival date & time 03/02/16  1054 History   First MD Initiated Contact with Patient 03/02/16 1100     Chief Complaint  Patient presents with  . Weakness     (Consider location/radiation/quality/duration/timing/severity/associated sxs/prior Treatment) HPI Patient presents with increasing weakness and decreased by mouth intake. She is unable to give history at this time. Level V caveat applies. Past Medical History  Diagnosis Date  . Hypertension   . Diabetes mellitus   . Depression   . Hyperlipidemia   . Bronchitis   . SOB (shortness of breath)   . Arthritis   . Dizziness   . Weakness   . Sore throat   . Sinus problem   . Family history of breast cancer   . Heart attack [facility]) 2004    uses to see Dr Vidal Schwalbe- doesnt see anyone since  . Stroke [facility])     left side weaker/ uses a walker, uses most of time.  . Chronic kidney disease     possible stones - confirm with patient  . Renal cancer ([place])   . Sleep apnea     study done around 05, hasnt uesd in a whiloe  . Hemiparesis and alteration of sensations as late effects of stroke ([place]) 11/13/2015  . Occipital stroke ([place]) 01/30/2016   Past Surgical History  Procedure Laterality Date  . Spleen removal  2001  . Laparoscopic gastric banding  05/09/2008  . Abdominal hysterectomy  2002  . Cholecystectomy  1999  . Nephrectomy  2001    due to cancer  . Carpal tunnel release    .  fracture achilles Left 12/29/2012    achilles calcaneal fracture      Dr Sharol Given  . Achilles tendon surgery Left 12/29/2012    Procedure: ACHILLES TENDON REPAIR;  Surgeon: Newt Minion, MD;  Location: [place];  Service: Orthopedics;  Laterality: Left;  Resection Left Calcaneal fracture and repair Achilles   Family History  Problem Relation Age of Onset  . Diabetes Father   . Alzheimer's disease Father   . Kidney cancer Father   . Hypertension Father   . Kidney disease Father     kidney removal  . Hypertension Mother   . Arthritis Mother    . Stroke Brother   . Diabetes Brother   . Hypertension Brother   . Diabetes Sister   . Thyroid disease Sister   . Hyperlipidemia Sister   . Other Sister     thyroidectomy   Social History  Substance Use Topics  . Smoking status: Never Smoker   . Smokeless tobacco: Never Used  . Alcohol Use: No   OB History    No data available     Review of Systems  Unable to perform ROS: Mental status change      Allergies  Iohexol  Home Medications   Prior to Admission medications   Medication Sig Start Date End Date Taking? Authorizing Provider  sertraline (ZOLOFT) 50 MG tablet Take 50 mg by mouth daily. 02/26/16  Yes Historical Provider, MD  Vitamin D, Ergocalciferol, (DRISDOL) 50000 units CAPS capsule Reported on 03/02/2016 09/29/15   Historical Provider, MD   BP 146/92 mmHg  Pulse 102  Temp(Src) 98.8 F (37.1 C) (Oral)  Resp 16  SpO2 100%  LMP 12/28/2012 Physical Exam  Constitutional: She appears well-developed and well-nourished. No distress.  HENT:  Head: Normocephalic and atraumatic.  Mouth/Throat: Oropharynx is clear and moist. No oropharyngeal exudate.  Eyes: EOM  are normal. Pupils are equal, round, and reactive to light.  Neck: Normal range of motion. Neck supple.  No meningismus  Cardiovascular: Normal rate and regular rhythm.  Exam reveals no gallop and no friction rub.   No murmur heard. Pulmonary/Chest: Effort normal and breath sounds normal. No respiratory distress. She has no wheezes. She has no rales. She exhibits no tenderness.  Abdominal: Soft. Bowel sounds are normal. She exhibits no distension and no mass. There is tenderness (diffuse abdominal tenderness). There is no rebound and no guarding.  Musculoskeletal: Normal range of motion. She exhibits no edema or tenderness.  No lower extremity swelling, asymmetry or tenderness. Distal pulses are intact.  Neurological: She is alert.  Oriented only to self. 4/5 motor in bilateral lower extremities. 5/5 motor  bilateral upper extremities. Sensation is grossly intact.  Skin: Skin is warm and dry. No rash noted. No erythema.  Psychiatric: She has a normal mood and affect. Her behavior is normal.  Nursing note and vitals reviewed.   ED Course  Procedures (including critical care time) Labs Review Labs Reviewed  PROTIME-INR - Abnormal; Notable for the following:    Prothrombin Time 15.4 (*)    All other components within normal limits  CBC - Abnormal; Notable for the following:    WBC 23.1 (*)    Hemoglobin 11.0 (*)    HCT 33.7 (*)    RDW 17.5 (*)    All other components within normal limits  DIFFERENTIAL - Abnormal; Notable for the following:    Neutro Abs 18.5 (*)    Monocytes Absolute 2.7 (*)    All other components within normal limits  COMPREHENSIVE METABOLIC PANEL - Abnormal; Notable for the following:    Sodium 134 (*)    Chloride 97 (*)    CO2 21 (*)    Glucose, Bld 161 (*)    BUN 22 (*)    Creatinine, Ser 1.39 (*)    Albumin 2.8 (*)    AST 98 (*)    Alkaline Phosphatase 189 (*)    Total Bilirubin 1.7 (*)    GFR calc non Af Amer 39 (*)    GFR calc Af Amer 46 (*)    Anion gap 16 (*)    All other components within normal limits  URINALYSIS, ROUTINE W REFLEX MICROSCOPIC (NOT AT [facility]) - Abnormal; Notable for the following:    Color, Urine AMBER (*)    APPearance CLOUDY (*)    Bilirubin Urine SMALL (*)    Ketones, ur 15 (*)    Protein, ur 30 (*)    All other components within normal limits  URINE MICROSCOPIC-ADD ON - Abnormal; Notable for the following:    Squamous Epithelial / LPF 0-5 (*)    All other components within normal limits  I-STAT CHEM 8, ED - Abnormal; Notable for the following:    Chloride 100 (*)    BUN 25 (*)    Creatinine, Ser 1.30 (*)    Glucose, Bld 161 (*)    All other components within normal limits  CULTURE, BLOOD (ROUTINE X 2)  CULTURE, BLOOD (ROUTINE X 2)  ETHANOL  APTT  URINE RAPID DRUG SCREEN, HOSP PERFORMED  I-STAT TROPOININ, ED     Imaging Review Ct Head Wo Contrast  03/02/2016  CLINICAL DATA:  Increasing weakness, anorexia, hypertension, diabetes mellitus, history MI and stroke EXAM: CT HEAD WITHOUT CONTRAST TECHNIQUE: Contiguous axial images were obtained from the base of the skull through the vertex without intravenous contrast. COMPARISON:  07/19/2012 FINDINGS: Generalized atrophy. Normal ventricular morphology. No midline shift or mass effect. Small vessel chronic ischemic changes of deep cerebral white matter. 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 Old lacunar infarcts BILATERAL thalamus. No intracranial hemorrhage, mass lesion, or acute infarction. Visualized paranasal sinuses and mastoid air cells clear. Bones unremarkable. IMPRESSION: Atrophy with small vessel chronic ischemic changes of deep cerebral white matter. 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 No acute intracranial abnormalities. Electronically Signed   By: Lavonia Dana M.D.   On: 03/02/2016 12:16   I have personally reviewed and evaluated these images and lab results as part of my medical decision-making.   EKG Interpretation None      MDM   Final diagnoses:  Generalized weakness  Anorexia  Leukocytosis   Per husband patient has been gradually declining over the past 2-3 weeks. She is at the point now where she requires assistance to transfer. She's had decreased by mouth intake. Workup is negative for leukocytosis and mild elevation in creatinine. Given IV fluids. Reexam of patient's abdomen shows no tenderness, rebound or guarding. Discussed with hospitalist and we'll admit to telemetry bed for observation. Blood cultures and chest x-ray obtained.    Julianne Rice, MD 03/02/16 [phone]

## 2016-03-02 NOTE — ED Notes (Signed)
Pts husband reports pt has been more week than usual for about 2 weeks, but "she just gave up yesterday." He states she has not been eating for two weeks and wouldn't even drink a bottle of Ensure. He reports she needs help to get out of the bed. This is not normal for her. Pt alert and oriented x4

## 2016-03-02 NOTE — H&P (Addendum)
HISTORY AND PHYSICAL       PATIENT DETAILS Name: [name] Age: 65 y.o. Sex: female Date of Birth: 09-25-1951 Admit Date: 03/02/2016 [id number] A Avbuere, MD Referring MD/NP/PA: Outpatient Specialists: Dr. Jannifer Franklin (neurology Patient coming from: Home   CHIEF COMPLAINT:  Loss of appetite and generalized weakness times one month  HPI: [name] is a 65 y.o. female with medical history significant of recurrent CVAs, hypertension, probable vascular dementia, diabetes not taking her medications for the past few months brought to the ED for evaluation of weight loss, and generalized weakness for approximately one month. Patient is a very poor historian, most of this history is obtained after talking to the spouse at bedside. Apparently for the past few months, patient has had poor appetite and worsening generalized weakness. The symptoms have been much worse for the past 1 month where she has been essentially bedbound. She has stopped eating per husband, and for the past few days is unable to even get up and move around. Per husband, patient has hypersomnolence and sleeps around 90% of the time. No history of fever, nausea, vomiting, diarrhea or chest pain. Denies any shortness of breath. Since patient has continued to deteriorate, patient was brought to ED for further evaluation and treatment.  ED Course: In the emergency room, patient was found to have significant leukocytosis of 23,000, mildly elevated creatinine. UA was negative for UTI. CT abdomen and chest x-ray was pending. I was subsequently asked to admit this patient for further evaluation and treatment  Chart review: Seen by Dr. Kem Kays similar symptoms in January 2017, outpatient MRI showed acute right occipital ischemic infarction. He ordered a echo and carotid Doppler that was never done. Extensive workup including autoimmune panel, TSH, HIV, vitamin B12, RPR was done and were negative. During  that visit, there is documentation that patient has had significant cognitive decline, functional decline with lethargy and hypersomnolence. She was subsequently seen by Dr. Jannifer Franklin in early April, where documentation shows that patient had stopped taking her medications.  Lives at: Home Mobility: Bedbound for the past 1 month Chronic Indwelling Foley:no  REVIEW OF SYSTEMS:  Constitutional:   No  night sweats,  Fevers, chills, fatigue.  HEENT:    No headaches, Dysphagia,Tooth/dental problems,Sore throat,  No sneezing, itching, ear ache, nasal congestion, post nasal drip  Cardio-vascular: No chest pain,Orthopnea, PND,lower extremity edema, palpitations  GI:  No heartburn, indigestion, abdominal pain, nausea, vomiting, diarrhea, melena or hematochezia  Resp: No shortness of breath, cough, hemoptysis,plueritic chest pain.   Skin:  No rash or lesions.  GU:  No dysuria, change in color of urine, no urgency or frequency.  No flank pain.  Musculoskeletal: No joint pain or swelling.  No decreased range of motion.  No back pain.  Endocrine: No heat intolerance, no cold intolerance, no polyuria, no polydipsia  Psych: No change in mood or affect. No depression or anxiety.  No memory loss.   ALLERGIES:   Allergies  Allergen Reactions  . Iohexol      Code: HIVES, Desc: Pt states in 2005 w/ a heart cath, she broke out in large hives and a rash.  She was given 50mg  benadryl, po.  She tolerated the procedure well w/o complication.  Thanks., Onset Date: [id number]     PAST MEDICAL HISTORY: Past Medical History  Diagnosis Date  . Hypertension   . Diabetes mellitus   . Depression   . Hyperlipidemia   . Bronchitis   .  SOB (shortness of breath)   . Arthritis   . Dizziness   . Weakness   . Sore throat   . Sinus problem   . Family history of breast cancer   . Heart attack [facility]) 2004    uses to see Dr Vidal Schwalbe- doesnt see anyone since  . Stroke [facility])     left side weaker/ uses a  walker, uses most of time.  . Chronic kidney disease     possible stones - confirm with patient  . Renal cancer ([place])   . Sleep apnea     study done around 05, hasnt uesd in a whiloe  . Hemiparesis and alteration of sensations as late effects of stroke ([place]) 11/13/2015  . Occipital stroke ([place]) 01/30/2016    PAST SURGICAL HISTORY: Past Surgical History  Procedure Laterality Date  . Spleen removal  2001  . Laparoscopic gastric banding  05/09/2008  . Abdominal hysterectomy  2002  . Cholecystectomy  1999  . Nephrectomy  2001    due to cancer  . Carpal tunnel release    .  fracture achilles Left 12/29/2012    achilles calcaneal fracture      Dr Sharol Given  . Achilles tendon surgery Left 12/29/2012    Procedure: ACHILLES TENDON REPAIR;  Surgeon: Newt Minion, MD;  Location: [facility];  Service: Orthopedics;  Laterality: Left;  Resection Left Calcaneal fracture and repair Achilles    MEDICATIONS AT HOME: Prior to Admission medications   Medication Sig Start Date End Date Taking? Authorizing Provider  sertraline (ZOLOFT) 50 MG tablet Take 50 mg by mouth daily. 02/26/16  Yes Historical Provider, MD  Vitamin D, Ergocalciferol, (DRISDOL) 50000 units CAPS capsule Reported on 03/02/2016 09/29/15   Historical Provider, MD    FAMILY HISTORY: Family History  Problem Relation Age of Onset  . Diabetes Father   . Alzheimer's disease Father   . Kidney cancer Father   . Hypertension Father   . Kidney disease Father     kidney removal  . Hypertension Mother   . Arthritis Mother   . Stroke Brother   . Diabetes Brother   . Hypertension Brother   . Diabetes Sister   . Thyroid disease Sister   . Hyperlipidemia Sister   . Other Sister     thyroidectomy    SOCIAL HISTORY:  reports that she has never smoked. She has never used smokeless tobacco. She reports that she does not drink alcohol or use illicit drugs.  PHYSICAL EXAM: Blood pressure 146/92, pulse 102, temperature 98.8 F (37.1 C), temperature  source Oral, resp. rate 16, last menstrual period 12/28/2012, SpO2 100 %.  General appearance :Awake, alert but pleasantly confused-looks chronically ill. Extremities appears cachectic, does have some central adiposity. Speech slow but clear. HEENT: Atraumatic and Normocephalic, pupils equally reactive to light and accomodation Neck: supple, no JVD. Chest:Good air entry bilaterally, no added sounds  CVS: S1 S2 regular, no murmurs.  Abdomen: Bowel sounds present, Non tender and not distended with no gaurding, rigidity or rebound. Extremities: B/L Lower Ext shows no edema, both legs are warm to touch Neurology:  Non focal-but generalized weakness Psychiatric: Appears pleasantly confused-however answers some questions appropriately Skin:No Rash Wounds:N/A  LABS ON ADMISSION:  I have personally reviewed following labs and imaging studies  CBC:  Recent Labs Lab 03/02/16 1127 03/02/16 1144  WBC 23.1*  --   NEUTROABS 18.5*  --   HGB 11.0* 13.6  HCT 33.7* 40.0  MCV 86.9  --  PLT 365  --     Basic Metabolic Panel:  Recent Labs Lab 03/02/16 1127 03/02/16 1144  NA 134* 135  K 4.2 4.2  CL 97* 100*  CO2 21*  --   GLUCOSE 161* 161*  BUN 22* 25*  CREATININE 1.39* 1.30*  CALCIUM 9.9  --     GFR: CrCl cannot be calculated (Unknown ideal weight.).  Liver Function Tests:  Recent Labs Lab 03/02/16 1127  AST 98*  ALT 17  ALKPHOS 189*  BILITOT 1.7*  PROT 7.8  ALBUMIN 2.8*   No results for input(s): LIPASE, AMYLASE in the last 168 hours. No results for input(s): AMMONIA in the last 168 hours.  Coagulation Profile:  Recent Labs Lab 03/02/16 1127  INR 1.20    Cardiac Enzymes: No results for input(s): CKTOTAL, CKMB, CKMBINDEX, TROPONINI in the last 168 hours.  BNP (last 3 results) No results for input(s): PROBNP in the last 8760 hours.  HbA1C: No results for input(s): HGBA1C in the last 72 hours.  CBG: No results for input(s): GLUCAP in the last 168  hours.  Lipid Profile: No results for input(s): CHOL, HDL, LDLCALC, TRIG, CHOLHDL, LDLDIRECT in the last 72 hours.  Thyroid Function Tests: No results for input(s): TSH, T4TOTAL, FREET4, T3FREE, THYROIDAB in the last 72 hours.  Anemia Panel: No results for input(s): VITAMINB12, FOLATE, FERRITIN, TIBC, IRON, RETICCTPCT in the last 72 hours.  Urine analysis:    Component Value Date/Time   COLORURINE AMBER* 03/02/2016 1310   APPEARANCEUR CLOUDY* 03/02/2016 1310   LABSPEC 1.020 03/02/2016 1310   PHURINE 5.5 03/02/2016 1310   GLUCOSEU NEGATIVE 03/02/2016 1310   HGBUR NEGATIVE 03/02/2016 1310   BILIRUBINUR SMALL* 03/02/2016 1310   KETONESUR 15* 03/02/2016 1310   PROTEINUR 30* 03/02/2016 1310   UROBILINOGEN 0.2 01/09/2013 1151   NITRITE NEGATIVE 03/02/2016 1310   LEUKOCYTESUR NEGATIVE 03/02/2016 1310    Sepsis Labs: Lactic Acid, Venous No results found for: [place]   Microbiology: No results found for this or any previous visit (from the past 240 hour(s)).    RADIOLOGIC STUDIES ON ADMISSION: Ct Head Wo Contrast  03/02/2016  CLINICAL DATA:  Increasing weakness, anorexia, hypertension, diabetes mellitus, history MI and stroke EXAM: CT HEAD WITHOUT CONTRAST TECHNIQUE: Contiguous axial images were obtained from the base of the skull through the vertex without intravenous contrast. COMPARISON:  07/19/2012 FINDINGS: Generalized atrophy. Normal ventricular morphology. No midline shift or mass effect. Small vessel chronic ischemic changes of deep cerebral white matter. 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 Old lacunar infarcts BILATERAL thalamus. No intracranial hemorrhage, mass lesion, or acute infarction. Visualized paranasal sinuses and mastoid air cells clear. Bones unremarkable. IMPRESSION: Atrophy with small vessel chronic ischemic changes of deep cerebral white matter. 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 No acute intracranial abnormalities. Electronically Signed    By: Lavonia Dana M.D.   On: 03/02/2016 12:16   EKG:  Personally reviewed.Normal sinus rhythm.  ASSESSMENT AND PLAN: Present on Admission:  . Failure to thrive: With extensive weight loss, loss of Appetite, hypersomnolence and significant functional and cognitive decline over the past few months. Differentials at this time include infectious etiology, underlying malignancy, and possible new CVA (doubt). Although she has significant leukocytosis, she is afebrile, no clear-cut foci of infection is apparent at this time. Hold off on starting antibiotics, obtain blood cultures, chest x-ray and await CT of the abdomen. As noted in history of present illness, patient has had workup done earlier this year with negative  HIV, RPR and autoimmune workup. TSH and vitamin B12 levels were also normal. Will admit to telemetry unit, gently hydrate for the time being and await culture data and other studies.  Addendum 6:30 PM: Spoke with the radiologist earlier-CT of the abdomen shows a possible left splenic flexure mass with peritoneal, pericardial, paraesophageal lymphadenopathy, along with liver and likely lung metastases. Suspected metastatic colon cancer as a preliminary diagnoses. I had a long discussion with the patient's spouse at bedside, patient has dementia, has been essentially bedbound for a month so and has a rapid decline in renal function and cognitive status. Even attempting a colonoscopy will be challenging-I am not sure whether she will be able to cooperate enough for a bowel prep-given the fact that she's been declining for/medications. She also has a solitary kidney with mild AKI-doing contrast enhanced imaging is always going to be challenging. Furthermore she has very poor functional status-bedbound and dependent on hospitals for all activities of daily living-Making her very poor candidate for palliative treatment options. Her spouse is aware for these challenges and is fully aware of very poor  long-term prognosis. At this time, I would proceed with a palliative care evaluation first to discuss goals of care instead of initiating any further workup. In the meantime, patient's spouse will start engaging in other family members. At this time patient remains a full code.  Marland Kitchen History of recurrent CVA: Per chart review-patient has had cardiac monitoring which was unrevealing. She has not been taking any of her medications including Plavix and statins. Will restart Plavix, she has a nonfocal exam, if cultures, CT abdomen/chest x-ray and further workup continued to be negative, may need to reimage the brain at some point.  Marland Kitchen AKI with solitary functioning kidney-patient is status post nephrectomy in the remote past (solitary kidney): Avoid nephrotoxic agents, hydrate and recheck electrolytes in a.m.  Marland Kitchen Type 2 diabetes: Previously on oral hypoglycemic agents-A1c 6.1 on 4/5. Will place on SSI and follow.  Marland Kitchen HTN (hypertension): Not on any antihypertensives-as noncompliant for the past few months. Blood pressure reasonable in the emergency room, will use when necessary hydralazine.  . Noncompliance to medications: Difficult situation-patient appears to have dementia -and has been refusing to eat and to take any of her medications. Await workup, if malignancy diagnosed-may need to initiate palliative measures.  . Hypersomnia with sleep apnea: Await workup-if no organic cause found-may need outpatient sleep study.  Further plan will depend as patient's clinical course evolves and further radiologic and laboratory data become available. Patient will be monitored closely.  Above noted plan was discussed with patient/spouse face to face at bedside, they were in agreement.   CONSULTS: None  DVT Prophylaxis: Prophylactic Lovenox   Code Status: Full Code  Disposition Plan:  Likely will require SNF-depending on clinical course.   Admission status: Inpatient  going to tele  Total time spent  55  minutes.Greater than 50% of this time was spent in counseling, explanation of diagnosis, planning of further management, and coordination of care.  [facility] Pager [phone]  If 7PM-7AM, please contact night-coverage [email or web address] Password [facility] 03/02/2016, 5:49 PM

## 2016-03-03 ENCOUNTER — Telehealth: Payer: Self-pay | Admitting: Neurology

## 2016-03-03 DIAGNOSIS — E118 Type 2 diabetes mellitus with unspecified complications: Secondary | ICD-10-CM

## 2016-03-03 DIAGNOSIS — R1900 Intra-abdominal and pelvic swelling, mass and lump, unspecified site: Secondary | ICD-10-CM

## 2016-03-03 DIAGNOSIS — Z7189 Other specified counseling: Secondary | ICD-10-CM

## 2016-03-03 DIAGNOSIS — Z515 Encounter for palliative care: Secondary | ICD-10-CM

## 2016-03-03 LAB — GLUCOSE, CAPILLARY
Glucose-Capillary: 145 mg/dL — ABNORMAL HIGH (ref 65–99)
Glucose-Capillary: 154 mg/dL — ABNORMAL HIGH (ref 65–99)
Glucose-Capillary: 154 mg/dL — ABNORMAL HIGH (ref 65–99)
Glucose-Capillary: 132 mg/dL — ABNORMAL HIGH (ref 65–99)

## 2016-03-03 LAB — CBC
HCT: 30.3 % — ABNORMAL LOW (ref 36.0–46.0)
HEMOGLOBIN: 9.8 g/dL — AB (ref 12.0–15.0)
MCH: 28.6 pg (ref 26.0–34.0)
MCHC: 32.3 g/dL (ref 30.0–36.0)
MCV: 88.3 fL (ref 78.0–100.0)
PLATELETS: 354 10*3/uL (ref 150–400)
RBC: 3.43 MIL/uL — AB (ref 3.87–5.11)
RDW: 17.5 % — ABNORMAL HIGH (ref 11.5–15.5)
WBC: 25.8 10*3/uL — ABNORMAL HIGH (ref 4.0–10.5)

## 2016-03-03 LAB — COMPREHENSIVE METABOLIC PANEL
ALBUMIN: 2.4 g/dL — AB (ref 3.5–5.0)
ALK PHOS: 166 U/L — AB (ref 38–126)
ALT: 18 U/L (ref 14–54)
ANION GAP: 12 (ref 5–15)
AST: 68 U/L — ABNORMAL HIGH (ref 15–41)
BUN: 25 mg/dL — ABNORMAL HIGH (ref 6–20)
CO2: 21 mmol/L — AB (ref 22–32)
Calcium: 9 mg/dL (ref 8.9–10.3)
Chloride: 100 mmol/L — ABNORMAL LOW (ref 101–111)
Creatinine, Ser: 1.21 mg/dL — ABNORMAL HIGH (ref 0.44–1.00)
GFR calc Af Amer: 54 mL/min — ABNORMAL LOW (ref >60)
GFR calc non Af Amer: 47 mL/min — ABNORMAL LOW (ref >60)
GLUCOSE: 158 mg/dL — AB (ref 65–99)
Potassium: 4.3 mmol/L (ref 3.5–5.1)
SODIUM: 133 mmol/L — AB (ref 135–145)
Total Bilirubin: 1.6 mg/dL — ABNORMAL HIGH (ref 0.3–1.2)
Total Protein: 7.2 g/dL (ref 6.5–8.1)

## 2016-03-03 LAB — LIPID PANEL
CHOL/HDL RATIO: 3.9 ratio
CHOLESTEROL: 138 mg/dL (ref 0–200)
HDL: 35 mg/dL — AB (ref >40)
LDL CALC: 86 mg/dL (ref 0–99)
TRIGLYCERIDES: 84 mg/dL (ref <150)
VLDL: 17 mg/dL (ref 0–40)

## 2016-03-03 LAB — PREALBUMIN: Prealbumin: 2.8 mg/dL — ABNORMAL LOW (ref 18–38)

## 2016-03-03 MED ORDER — ENSURE ENLIVE PO LIQD
237.0000 mL | ORAL | Status: DC
  Administered 2016-03-03 – 2016-03-05 (×2): 237 mL via ORAL

## 2016-03-03 MED ORDER — ADULT MULTIVITAMIN W/MINERALS CH
1.0000 | ORAL_TABLET | Freq: Every day | ORAL | Status: DC
  Administered 2016-03-03 – 2016-03-08 (×6): 1 via ORAL
  Filled 2016-03-03 (×6): qty 1

## 2016-03-03 MED ORDER — BOOST / RESOURCE BREEZE PO LIQD
1.0000 | ORAL | Status: DC
  Administered 2016-03-06: 1 via ORAL
  Filled 2016-03-03 (×4): qty 1

## 2016-03-03 MED ORDER — BOOST PLUS PO LIQD
237.0000 mL | ORAL | Status: DC
  Administered 2016-03-04 – 2016-03-06 (×2): 237 mL via ORAL
  Filled 2016-03-03 (×6): qty 237

## 2016-03-03 NOTE — Consult Note (Signed)
Consultation Note Date: 03/03/2016   Patient Name: [name]  DOB: Mar 05, 1951  MRN: [id number]  Age / Sex: 65 y.o., female  PCP: Nolene Ebbs, MD Referring Physician: Jonetta Osgood, MD  Reason for Consultation: Establishing goals of care and Psychosocial/spiritual support  HPI/Patient Profile: 65 y.o. female   admitted on 03/02/2016    [name] is a 65 y.o. female with medical history significant of recurrent CVAs, hypertension, probable vascular dementia, diabetes not taking her medications for the past few months brought to the ED for evaluation of weight loss, and generalized weakness for approximately one month. For the past few months, patient has had poor appetite and worsening generalized weakness. The symptoms have been much worse for the past 1 month where she has been essentially bedbound. She has stopped eating per husband, and now non ambulatory. Per husband, patient has hypersomnolence and sleeps around 90% of the time. Denies any shortness of breath. Since patient has continued to deteriorate, patient was brought to ED for further evaluation and treatment.  In the emergency room, patient was found to have significant leukocytosis of 23,000, mildly elevated creatinine.  UA was negative for UTI. CT abdomen  IMPRESSION: Evidence of metastatic disease involving the liver, multiple nodal stations (including pericardial, paraesophageal, left diaphragmatic, portacaval), and peritoneum of the left upper quadrant. Mineralized/calcified mass inseparable from the left hepatic flexure is suspicious for the primary malignancy. Recommend correlation with colonoscopy. If further imaging is warranted, consider contrast-enhanced CT abdomen.  Multiple small nodules of the inferior lungs, suspicious for metastases.  Seen by Dr. Kem Kays similar symptoms in January 2017,  outpatient MRI showed acute right occipital ischemic infarction. He ordered a echo and carotid Doppler that was never done. Extensive workup including autoimmune panel, TSH, HIV, vitamin B12, RPR was done and were negative. During that visit, there is documentation that patient has had significant cognitive decline, functional decline with lethargy and hypersomnolence. She was subsequently seen by Dr. Jannifer Franklin in early April, where documentation shows that patient had stopped taking her medications.  Complicated psychosocial situation.  Husband has health issues/ kidney recipient, and 1 yo daughter with CP lives at home.  Most of family support lives in [place].    Clinical Assessment and Goals of Care:    This NP Wadie Lessen reviewed medical records, received report from team, assessed the patient and then meet at the patient's bedside along with her husband and sister Bonita/by conference call to discuss diagnosis, prognosis, GOC, EOL wishes disposition and options.   A detailed discussion was had today regarding advanced directives.  Concepts specific to code status, artifical feeding and hydration, continued IV antibiotics and rehospitalization was had.  The difference between a aggressive medical intervention path  and a palliative comfort care path for this patient at this time was had.  Values and goals of care important to patient and family were attempted to be elicited.  Concept of Hospice and Palliative Care were discussed  Natural trajectory and expectations at EOL were discussed.  Questions  and concerns addressed.  Hard Choices booklet left for review. Family encouraged to call with questions or concerns.  PMT will continue to support holistically.    NEXT OF KIN/spouse    SUMMARY OF RECOMMENDATIONS    Code Status/Advance Care Planning:   Full code- strongly encouraged to consider DNR/DNI status knowing poor outcomes in like patients    Palliative Prophylaxis:    Aspiration, Bowel Regimen, Delirium Protocol, Frequent Pain Assessment and Oral Care  Additional Recommendations (Limitations, Scope, Preferences):   At this time family is hopeful for oncology input. They need more information in order to make informed decisions.  Long discussion had regarding consideration of further testing if patient is not a candidate for aggressive therapies due to multiple co-morbid ites and poor functional status.  Encouraged to consider intention of any therapies   Psycho-social/Spiritual:     Additional Recommendations: Education on Hospice  Prognosis:   < 3 months  Discharge Planning: To Be Determined      Primary Diagnoses: Present on Admission:  . Failure to thrive (0-17) . HTN (hypertension) . Hypersomnia with sleep apnea  I have reviewed the medical record, interviewed the patient and family, and examined the patient. The following aspects are pertinent.  Past Medical History  Diagnosis Date  . Hypertension   . Diabetes mellitus   . Depression   . Hyperlipidemia   . Bronchitis   . SOB (shortness of breath)   . Arthritis   . Dizziness   . Weakness   . Sore throat   . Sinus problem   . Family history of breast cancer   . Heart attack [facility]) 2004    uses to see Dr Vidal Schwalbe- doesnt see anyone since  . Stroke [facility])     left side weaker/ uses a walker, uses most of time.  . Chronic kidney disease     possible stones - confirm with patient  . Renal cancer ([place])   . Sleep apnea     study done around 05, hasnt uesd in a whiloe  . Hemiparesis and alteration of sensations as late effects of stroke ([place]) 11/13/2015  . Occipital stroke ([place]) 01/30/2016   Social History   Social History  . Marital Status: Married    Spouse Name: N/A  . Number of Children: N/A  . Years of Education: N/A   Social History Main Topics  . Smoking status: Never Smoker   . Smokeless tobacco: Never Used  . Alcohol Use: No  . Drug Use: No  . Sexual Activity:  No   Other Topics Concern  . None   Social History Narrative   Family History  Problem Relation Age of Onset  . Diabetes Father   . Alzheimer's disease Father   . Kidney cancer Father   . Hypertension Father   . Kidney disease Father     kidney removal  . Hypertension Mother   . Arthritis Mother   . Stroke Brother   . Diabetes Brother   . Hypertension Brother   . Diabetes Sister   . Thyroid disease Sister   . Hyperlipidemia Sister   . Other Sister     thyroidectomy   Scheduled Meds: . clopidogrel  75 mg Oral Daily  . enoxaparin (LOVENOX) injection  40 mg Subcutaneous Q24H  . insulin aspart  0-9 Units Subcutaneous TID WC  . sertraline  50 mg Oral Daily  . sodium chloride flush  3 mL Intravenous Q12H   Continuous Infusions: . sodium chloride 75 mL/hr  at 03/02/16 2027   PRN Meds:.acetaminophen **OR** acetaminophen, albuterol, hydrALAZINE, ondansetron **OR** ondansetron (ZOFRAN) IV, polyethylene glycol Medications Prior to Admission:  Prior to Admission medications   Medication Sig Start Date End Date Taking? Authorizing Provider  sertraline (ZOLOFT) 50 MG tablet Take 50 mg by mouth daily. 02/26/16   Historical Provider, MD  Vitamin D, Ergocalciferol, (DRISDOL) 50000 units CAPS capsule Reported on 03/02/2016 09/29/15   Historical Provider, MD   Allergies  Allergen Reactions  . Iohexol      Code: HIVES, Desc: Pt states in 2005 w/ a heart cath, she broke out in large hives and a rash.  She was given 50mg  benadryl, po.  She tolerated the procedure well w/o complication.  Thanks., Onset Date: [id number]    Review of Systems  Unable to perform ROS: Patient nonverbal    Physical Exam  Constitutional: She appears ill.  -frail and thin  Cardiovascular: Normal rate, regular rhythm and normal heart sounds.   Pulmonary/Chest: She has decreased breath sounds in the right lower field and the left lower field.  Neurological: She is alert.  Skin: Skin is warm and dry.    Vital  Signs: BP 163/91 mmHg  Pulse 114  Temp(Src) 98.6 F (37 C) (Oral)  Resp 18  SpO2 97%  LMP 12/28/2012 Pain Assessment: Faces       SpO2: SpO2: 97 % O2 Device:SpO2: 97 % O2 Flow Rate: .   IO: Intake/output summary:  Intake/Output Summary (Last 24 hours) at 03/03/16 1107 Last data filed at 03/03/16 0900  Gross per 24 hour  Intake    240 ml  Output      0 ml  Net    240 ml    LBM:   Baseline Weight:   Most recent weight:       Palliative Assessment/Data:  30 % at best   Discussed with Dr Sloan Leiter  Time In: 1100 Time Out: 1230  Time Total: 90 min Greater than 50%  of this time was spent counseling and coordinating care related to the above assessment and plan.  Signed by: Wadie Lessen, NP   Please contact Palliative Medicine Team phone at [phone] for questions and concerns.  For individual provider: See Shea Evans

## 2016-03-03 NOTE — Progress Notes (Addendum)
PROGRESS NOTE        PATIENT DETAILS Name: [name] Age: 65 y.o. Sex: female Date of Birth: 1951/03/31 Admit Date: 03/02/2016 Admitting Physician Evalee Mutton Kristeen Mans, MD [id number] Bunnie Domino, MD Outpatient Specialists:Dr Jannifer Franklin  Brief Narrative: Patient is a 65 y.o. female medical history significant of recurrent CVAs, hypertension, probable vascular dementia, diabetes, noncompliant with her medications for the past few months-brought to the ED forr evaluation of weight loss, loss of appetite, worsening generalized weakness to the extent that she is now bedbound. She is also had significant decline in her functional and cognitive function over the past few months. CT scan of the abdomen revealed probable metastatic colon cancer.   Subjective: Pleasantly confused. Chronically sick looking but does not appear to be in any distress. Answers some questions appropriately.  Assessment/Plan: Active Problems: Failure to thrive syndrome: Likely secondary to metastatic colon cancer. Given the rapid decline in cognitive and functional status, have consulted palliative care continued close of care before proceeding with any further workup at this point. This M.D. had a long discussion with the husband at bedside yesterday-and explained the very poor overall prognoses. No family present at bedside today.  Leukocytosis: UA/chest x-ray/CT chest negative for any infective foci. No foci evident on exam . Afebrile-however leukocytosis is worsening today. Await culture data, continue to monitor off antibiotics at this time.  AKI (with solitary kidney): History of present illness likely secondary to prerenal azotemia in a setting of poor oral intake and dehydration. Continue to gently hydrate. Avoid nephrotoxic agents.  History of recurrent CVA: Unfortunately noncompliant with antiplatelets including Plavix for the past few months. Continue Plavix for now.  History of left  nephrectomy: Remote history-spouse not aware of exactly why nephrectomy done-but he suspects malignancy.  History of type 2 diabetes: Noncompliant with oral hypoglycemics-off meds for the past few months-as very poor oral intake-CBGs currently stable with SSI. Avoid restarting oral hypoglycemics at this point.  Hypertension: Noncompliant with antihypertensives-was not taking any medications for the past few months-blood pressure slightly elevated-continue to monitor off antihypertensives for now. Continue as needed hydralazine.  Suspected dementia/hypersomnolence: Follows with neurology in the outpatient setting-suspect excessive sleepiness-likely part of failure to thrive syndrome and natural decline of this patient.  Severe protein calorie malnutrition: Prealbumin of 2.8!!Nutrition consult  Palliative care: Unfortunate 65 year old with history of recurrent CVA-she has had significant decline in her functional and cognitive status in the past few months. She is stopped taking all of her medications. She has had significant loss of appetite and weight loss over the past few months-her generalized weakness has worsened to the extent that she has been bedbound for the past 1 month. Further evaluation revealed probable metastatic colon cancer. Per spouse-patient has been refusing oral intake and any medications-she also has a solitary functioning kidney. At this time, not sure if patient can even tolerate colonoscopy preparation. Given mild AKI with a solitary kidney-CT scans without contrast will need to be done to stage the cancer. Given all these complexities-and very poor overall prognoses-we have consulted palliative care prior to initiation of any workup. This M.D. had a long discussion with the patient's spouse on admission, he is aware of very poor overall prognoses and the above noted complexities.  DVT Prophylaxis: Prophylactic Lovenox   Code Status: Full code  Family Communication: None at  bedside  Disposition Plan:  Remain inpatient-suspect will require SNF or residential hospice vs Home Hospice on discharge.   Antimicrobial agents: None  Procedures: None  CONSULTS:  Palliative  Time spent: 25 minutes-Greater than 50% of this time was spent in counseling, explanation of diagnosis, planning of further management, and coordination of care.  MEDICATIONS: Anti-infectives    None      Scheduled Meds: . clopidogrel  75 mg Oral Daily  . enoxaparin (LOVENOX) injection  40 mg Subcutaneous Q24H  . insulin aspart  0-9 Units Subcutaneous TID WC  . sertraline  50 mg Oral Daily  . sodium chloride flush  3 mL Intravenous Q12H   Continuous Infusions: . sodium chloride 75 mL/hr at 03/02/16 2027   PRN Meds:.acetaminophen **OR** acetaminophen, albuterol, hydrALAZINE, ondansetron **OR** ondansetron (ZOFRAN) IV, polyethylene glycol   PHYSICAL EXAM: Vital signs: Filed Vitals:   03/02/16 2044 03/03/16 0225 03/03/16 0526 03/03/16 0930  BP: 160/90 161/86 154/90 163/91  Pulse: 104 108 113 114  Temp: 98.9 F (37.2 C) 99.2 F (37.3 C) 99.4 F (37.4 C) 98.6 F (37 C)  TempSrc: Oral Oral Oral Oral  Resp: 18 19 18 18   SpO2: 97% 96% 98% 97%   There were no vitals filed for this visit. There is no weight on file to calculate BMI.   Gen Exam: Awake and And pleasantly confused. Neck: Supple, No JVD. Chest: B/L Clear.   CVS: S1 S2 Regular, no murmurs.  Abdomen: soft, BS +, non tender, non distended.  Extremities: no edema, lower extremities warm to touch. Neurologic: Non Focal-but with generalized weakness.   LABORATORY DATA: CBC:  Recent Labs Lab 03/02/16 1127 03/02/16 1144 03/03/16 0431  WBC 23.1*  --  25.8*  NEUTROABS 18.5*  --   --   HGB 11.0* 13.6 9.8*  HCT 33.7* 40.0 30.3*  MCV 86.9  --  88.3  PLT 365  --  [id number]    Basic Metabolic Panel:  Recent Labs Lab 03/02/16 1127 03/02/16 1144 03/03/16 0431  NA 134* 135 133*  K 4.2 4.2 4.3  CL 97* 100* 100*    CO2 21*  --  21*  GLUCOSE 161* 161* 158*  BUN 22* 25* 25*  CREATININE 1.39* 1.30* 1.21*  CALCIUM 9.9  --  9.0    GFR: CrCl cannot be calculated (Unknown ideal weight.).  Liver Function Tests:  Recent Labs Lab 03/02/16 1127 03/03/16 0431  AST 98* 68*  ALT 17 18  ALKPHOS 189* 166*  BILITOT 1.7* 1.6*  PROT 7.8 7.2  ALBUMIN 2.8* 2.4*   No results for input(s): LIPASE, AMYLASE in the last 168 hours. No results for input(s): AMMONIA in the last 168 hours.  Coagulation Profile:  Recent Labs Lab 03/02/16 1127  INR 1.20    Cardiac Enzymes: No results for input(s): CKTOTAL, CKMB, CKMBINDEX, TROPONINI in the last 168 hours.  BNP (last 3 results) No results for input(s): PROBNP in the last 8760 hours.  HbA1C: No results for input(s): HGBA1C in the last 72 hours.  CBG:  Recent Labs Lab 03/02/16 2346 03/03/16 0640  GLUCAP 127* 145*    Lipid Profile:  Recent Labs  03/03/16 0431  CHOL 138  HDL 35*  LDLCALC 86  TRIG 84  CHOLHDL 3.9    Thyroid Function Tests: No results for input(s): TSH, T4TOTAL, FREET4, T3FREE, THYROIDAB in the last 72 hours.  Anemia Panel: No results for input(s): VITAMINB12, FOLATE, FERRITIN, TIBC, IRON, RETICCTPCT in the last 72 hours.  Urine analysis:    Component Value Date/Time  COLORURINE AMBER* 03/02/2016 1310   APPEARANCEUR CLOUDY* 03/02/2016 1310   LABSPEC 1.020 03/02/2016 1310   PHURINE 5.5 03/02/2016 1310   GLUCOSEU NEGATIVE 03/02/2016 1310   HGBUR NEGATIVE 03/02/2016 1310   BILIRUBINUR SMALL* 03/02/2016 1310   KETONESUR 15* 03/02/2016 1310   PROTEINUR 30* 03/02/2016 1310   UROBILINOGEN 0.2 01/09/2013 1151   NITRITE NEGATIVE 03/02/2016 1310   LEUKOCYTESUR NEGATIVE 03/02/2016 1310    Sepsis Labs: Lactic Acid, Venous No results found for: LATICACIDVEN  MICROBIOLOGY: No results found for this or any previous visit (from the past 240 hour(s)).  RADIOLOGY STUDIES/RESULTS: Ct Abdomen Pelvis Wo  Contrast  03/02/2016  CLINICAL DATA:  64 year old female with a history of abdominal pain EXAM: CT ABDOMEN AND PELVIS WITHOUT CONTRAST TECHNIQUE: Multidetector CT imaging of the abdomen and pelvis was performed following the standard protocol without IV contrast. COMPARISON:  Plain film 05/10/2008, CT 11/23/2006 FINDINGS: Lower chest: Unremarkable appearance of the soft tissues of the chest wall. Heart size within normal limits.  No pericardial fluid/thickening. Calcifications of the coronary arteries. Pathologic adenopathy of the pericardial lymph nodes just above the diaphragm, with nodal mass measuring 3.8 cm by 1.6 cm. Diaphragmatic lymph node on the left measures 2.2 cm. Paraesophageal lymph node measures 3.2 cm by 2.2 cm. No confluent airspace disease. Several small nodules are identified within lingula, right middle lobe, right lower lobe, and left lower lobe in the sulcus. Largest measures 5 mm in the lingula. Abdomen/pelvis: Multiple nodules/masses within the liver parenchyma, new from the comparison CT of 2008. Ill-defined mineralization/calcification within the infiltrative lesions of the left lobe. Surgical changes of splenectomy. Surgical changes of lap banding. Soft tissue mass at the descending duodenum/pancreatic head, with poor definition of the small bowel and the pancreas. Soft tissue measures 4.9 cm x 4.4 cm. Ill-defined circumferential soft tissue thickening of the splenic flexure with ill-defined mineralization/calcifications extending towards the greater curvature of the stomach. Greatest size of this soft tissue mass measures 4.7 cm x 4.4 cm. There is abrupt transition of both the transverse colon and the descending colon after the soft tissue mass. Multiple soft tissue nodule/peritoneal lymph nodes. Partially calcified/mineralized soft tissue mass in the right upper quadrant, adjacent to the greater curvature. Surgical changes of left nephrectomy. Unremarkable left adrenal gland.  Unremarkable right adrenal gland. No evidence of right-sided hydronephrosis. Nonobstructive stone measures 5 mm -6 mm at the superior collecting system. Unremarkable course of the right ureter. Unremarkable appearance of the urinary bladder. Diffuse calcifications of the abdominal vasculature.  No aneurysm. Hysterectomy. No displaced fracture. Multilevel degenerative changes of the visualized thoracolumbar spine. Facet changes bilaterally. Degenerative changes of the bilateral hips. IMPRESSION: Evidence of metastatic disease involving the liver, multiple nodal stations (including pericardial, paraesophageal, left diaphragmatic, portacaval), and peritoneum of the left upper quadrant. Mineralized/calcified mass inseparable from the left hepatic flexure is suspicious for the primary malignancy. Recommend correlation with colonoscopy. If further imaging is warranted, consider contrast-enhanced CT abdomen. Multiple small nodules of the inferior lungs, suspicious for metastases. Contrast-enhanced chest CT recommended for further evaluation. These results were called by telephone at the time of interpretation on 03/02/2016 at 5:41 pm to Dr. Sloan Leiter who verbally acknowledged these results. Surgical changes of prior left nephrectomy. Nonobstructive kidney stone of the right superior collecting system. Atherosclerosis with evidence of coronary artery disease. Signed, Dulcy Fanny. Earleen Newport, DO [facility] Electronically Signed   By: Corrie Mckusick D.O.   On: 03/02/2016 17:49   Dg Chest 2 View  03/02/2016  CLINICAL DATA:  Weakness for 2 days. EXAM: CHEST  2 VIEW COMPARISON:  07/20/2012 radiograph FINDINGS: Cardiomegaly, coronary stent and mild elevation of the right hemidiaphragm again noted. There is no evidence of focal airspace disease, pulmonary edema, suspicious pulmonary nodule/mass, pleural effusion, or pneumothorax. No acute bony abnormalities are identified. Remote  left rib fractures are identified. IMPRESSION: Cardiomegaly without evidence of acute cardiopulmonary disease. Electronically Signed   By: Margarette Canada M.D.   On: 03/02/2016 18:12   Ct Head Wo Contrast  03/02/2016  CLINICAL DATA:  Increasing weakness, anorexia, hypertension, diabetes mellitus, history MI and stroke EXAM: CT HEAD WITHOUT CONTRAST TECHNIQUE: Contiguous axial images were obtained from the base of the skull through the vertex without intravenous contrast. COMPARISON:  07/19/2012 FINDINGS: Generalized atrophy. Normal ventricular morphology. No midline shift or mass effect. Small vessel chronic ischemic changes of deep cerebral white matter. 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 Old lacunar infarcts BILATERAL thalamus. No intracranial hemorrhage, mass lesion, or acute infarction. Visualized paranasal sinuses and mastoid air cells clear. Bones unremarkable. IMPRESSION: Atrophy with small vessel chronic ischemic changes of deep cerebral white matter. 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 No acute intracranial abnormalities. Electronically Signed   By: Lavonia Dana M.D.   On: 03/02/2016 12:16     LOS: 1 day   Oren Binet, MD  [facility] Pager:[phone]  If 7PM-7AM, please contact night-coverage [email or web address] Password [facility] 03/03/2016, 10:34 AM

## 2016-03-03 NOTE — Progress Notes (Signed)
Initial Nutrition Assessment  INTERVENTION:  Provide Ensure Enlive po once daily, each supplement provides 350 kcal and 20 grams of protein Provide Boost Breeze po once daily, each supplement provides 250 kcal and 9 grams of protein Provide Boost Plus once daily, provides 360 kcal and 14 grams of protein Provide Multivitamin with minerals daily   NUTRITION DIAGNOSIS:   Inadequate oral intake related to lethargy/confusion, poor appetite as evidenced by per patient/family report, energy intake < 75% for > or equal to 1 month, moderate depletions of muscle mass.   GOAL:   Patient will meet greater than or equal to 90% of their needs   MONITOR:   PO intake, Supplement acceptance, Labs, Weight trends, I & O's, Skin  REASON FOR ASSESSMENT:   Consult Assessment of nutrition requirement/status  ASSESSMENT:   65 y.o. female with medical history significant of recurrent CVAs, hypertension, probable vascular dementia, diabetes not taking her medications for the past few months brought to the ED for evaluation of weight loss, and generalized weakness for approximately one month.   Pt's husband at bedside provided history. He reports that starting 2 months ago, pt started eating only a few bites of food at each meal and has been eating even less over the past month due to patient sleeping most of the day. Husband reports that pt has lost from 180 lbs to 159 lbs in the past 2 months. Per weight history, pt has only lost 7 lbs in from January to April; no new weight in the past month. He states that patient only ate a couple bites of pears for lunch. Per nursing notes, pt ate 25% of breakfast. PTA pt started drinking 1/2 to 1 bottles of Ensure daily; didn't like it much per husband.  Pt tried Boost Breeze at time of visit and nodded that she liked it. Pt and husband agreeable to trying additional nutritional supplements.   Labs: glucose ranging 127 to 154 mg/dL, low sodium, low prealbumin,    Diet Order:  Diet heart healthy/carb modified Room service appropriate?: Yes; Fluid consistency:: Thin  Skin:  Reviewed, no issues  Last BM:  unknown  Height:   Ht Readings from Last 1 Encounters:  01/30/16 5\' 1"  (1.549 m)    Weight:   Wt Readings from Last 1 Encounters:  01/30/16 159 lb (72.122 kg)    Ideal Body Weight:  47.7 kg  BMI:  There is no weight on file to calculate BMI.  Estimated Nutritional Needs:   Kcal:  1450-1650  Protein:  80-90 grams  Fluid:  >/= 1.8 L/day  EDUCATION NEEDS:   No education needs identified at this time  [place], LDN Inpatient Clinical Dietitian Pager: [phone] After Hours Pager: [phone]

## 2016-03-03 NOTE — Progress Notes (Signed)
OT Cancellation Note  Patient Details Name: DALARIE LONGTIN MRN: [id number] DOB: October 11, 1951   Cancelled Treatment:    Reason Eval/Treat Not Completed: Other (comment) After speaking to RN, will wait for Palliative Care consult before proceeding with OT evaluation.   Redmond Baseman, OTR/L Pager: [phone] 03/03/2016, 10:49 AM

## 2016-03-03 NOTE — Telephone Encounter (Signed)
I called the patient, talk with the husband. The patient is in hospital, recently diagnosed with cancer in the abdomen. There is evidence of metastatic disease to the pericardium. The patient has had recent strokes, need to exclude the possibility of marantic endocarditis, or a hypercoagulable state associated with the cancer. No evidence of metastatic disease to the brain. The patient has not yet had the echocardiogram or carotid Doppler study that was ordered in early April.

## 2016-03-03 NOTE — Care Management Note (Signed)
Case Management Note  Patient Details  Name: ENGLISH ACKER MRN: [id number] Date of Birth: [birth year]  Subjective/Objective:                    Action/Plan: Patient was admitted with failure to thrive. Lives at home with husband. Will follow for discharge needs pending PT/OT evals and physician orders.  Expected Discharge Date:                  Expected Discharge Plan:     In-House Referral:     Discharge planning Services     Post Acute Care Choice:    Choice offered to:     DME Arranged:    DME Agency:     HH Arranged:    HH Agency:     Status of Service:  In process, will continue to follow  Medicare Important Message Given:    Date Medicare IM Given:    Medicare IM give by:    Date Additional Medicare IM Given:    Additional Medicare Important Message give by:     If discussed at [place] of Stay Meetings, dates discussed:    Additional Comments:  Rolm Baptise, RN 03/03/2016, 10:56 AM [phone]

## 2016-03-03 NOTE — Telephone Encounter (Signed)
Patient's husband is calling and states has wife was taken to [facility] and found to have stomach cancer.  He requested a call back from Dr. Jannifer Franklin. Thanks!

## 2016-03-03 NOTE — Progress Notes (Signed)
PT Cancellation Note  Patient Details Name: [name] MRN: [id number] DOB: 01/10/1951   Cancelled Treatment:    Reason Eval/Treat Not Completed: Patient not medically ready. Per chart, awaiting palliative consult. Pt has been bed bound for the last month. Pending Palliative consult and family decision regarding medical plan for new diagnoses of Stage IV abdominal cancer PT will return if applicable to complete eval.    Sandar Krinke, Knute Neu 03/03/2016, 11:27 AM  Kittie Plater, PT, DPT Pager #: [phone] Office #: [phone]

## 2016-03-03 NOTE — Evaluation (Signed)
Clinical/Bedside Swallow Evaluation Patient Details  Name: [name] MRN: [id number] Date of Birth: April 03, 1951  Today's Date: 03/03/2016 Time: SLP Start Time (ACUTE ONLY): 1127 SLP Stop Time (ACUTE ONLY): 1150 SLP Time Calculation (min) (ACUTE ONLY): 23 min  Past Medical History:  Past Medical History  Diagnosis Date  . Hypertension   . Diabetes mellitus   . Depression   . Hyperlipidemia   . Bronchitis   . SOB (shortness of breath)   . Arthritis   . Dizziness   . Weakness   . Sore throat   . Sinus problem   . Family history of breast cancer   . Heart attack [facility]) 2004    uses to see Dr Vidal Schwalbe- doesnt see anyone since  . Stroke [facility])     left side weaker/ uses a walker, uses most of time.  . Chronic kidney disease     possible stones - confirm with patient  . Renal cancer ([place])   . Sleep apnea     study done around 05, hasnt uesd in a whiloe  . Hemiparesis and alteration of sensations as late effects of stroke ([place]) 11/13/2015  . Occipital stroke ([place]) 01/30/2016   Past Surgical History:  Past Surgical History  Procedure Laterality Date  . Spleen removal  2001  . Laparoscopic gastric banding  05/09/2008  . Abdominal hysterectomy  2002  . Cholecystectomy  1999  . Nephrectomy  2001    due to cancer  . Carpal tunnel release    .  fracture achilles Left 12/29/2012    achilles calcaneal fracture      Dr Sharol Given  . Achilles tendon surgery Left 12/29/2012    Procedure: ACHILLES TENDON REPAIR;  Surgeon: Newt Minion, MD;  Location: [place];  Service: Orthopedics;  Laterality: Left;  Resection Left Calcaneal fracture and repair Achilles   HPI:  [name] is a 65 y.o. female with medical history significant of recurrent CVAs, hypertension, probable vascular dementia, diabetes (non compliant for the past few months), renal cancer admitted for evaluation of weight loss, and generalized weakness for approximately one month. CXR Cardiomegaly without evidence of acute  cardiopulmonary disease. CT atrophy with small vessel chronic ischemic changes of deep cerebral white matter. Old lacunar infarcts RIGHT pons and BILATERAL thalamus. No acute intracranial abnormalities. No prior ST documentation for swallow, only cognition.     Assessment / Plan / Recommendation Clinical Impression  Oropharyngeal phases of swallow with thin and puree appeared functional wit mild suspicion of delayed initiation of swallow. Pt declined solid texture given max encouragement, therefore unable to assess efficiency. Initiate regular texture and thin liquids, straws allowed, intermittent supervision. Will follow safety and/or need to downgrade diet.        Aspiration Risk   (mild-mod)    Diet Recommendation Thin liquid;Regular   Liquid Administration via: Cup;Straw Medication Administration: Whole meds with puree Supervision: Patient able to self feed;Intermittent supervision to cue for compensatory strategies Compensations: Slow rate;Small sips/bites;Lingual sweep for clearance of pocketing;Minimize environmental distractions Postural Changes: Seated upright at 90 degrees    Other  Recommendations Oral Care Recommendations: Oral care BID   Follow up Recommendations  None    Frequency and Duration min 2x/week  2 weeks       Prognosis Prognosis for Safe Diet Advancement:  (fair-good) Barriers to Reach Goals: Cognitive deficits      Swallow Study   General HPI: [name] is a 65 y.o. female with medical history significant of  recurrent CVAs, hypertension, probable vascular dementia, diabetes (non compliant for the past few months), renal cancer admitted for evaluation of weight loss, and generalized weakness for approximately one month. CXR Cardiomegaly without evidence of acute cardiopulmonary disease. CT atrophy with small vessel chronic ischemic changes of deep cerebral white matter. Old lacunar infarcts RIGHT pons and BILATERAL thalamus. No acute intracranial  abnormalities. No prior ST documentation for swallow, only cognition.   Type of Study: Bedside Swallow Evaluation Previous Swallow Assessment:  (none) Diet Prior to this Study: NPO Temperature Spikes Noted: Yes Respiratory Status: Room air History of Recent Intubation: No Behavior/Cognition: Alert;Requires cueing;Distractible Oral Cavity Assessment: Within Functional Limits Oral Care Completed by SLP: No Oral Cavity - Dentition:  (unable to view entire oral cavity) Vision: Functional for self-feeding Self-Feeding Abilities: Able to feed self;Needs set up Patient Positioning: Upright in bed Baseline Vocal Quality: Normal Volitional Cough:  (pt unable ) Volitional Swallow: Unable to elicit    Oral/Motor/Sensory Function Overall Oral Motor/Sensory Function: Within functional limits   Ice Chips Ice chips: Not tested   Thin Liquid Thin Liquid: Impaired Presentation: Cup;Straw Pharyngeal  Phase Impairments: Suspected delayed Swallow    Nectar Thick Nectar Thick Liquid: Not tested   Honey Thick Honey Thick Liquid: Not tested   Puree Puree: Within functional limits   Solid   GO   Solid:  (pt declined)        [place] Siren 03/03/2016,2:59 PM   Orbie Pyo Colvin Caroli.Ed [employer] [phone]

## 2016-03-04 ENCOUNTER — Other Ambulatory Visit (HOSPITAL_COMMUNITY): Payer: Medicare (Managed Care)

## 2016-03-04 ENCOUNTER — Inpatient Hospital Stay (HOSPITAL_COMMUNITY): Payer: Medicare Other

## 2016-03-04 DIAGNOSIS — R627 Adult failure to thrive: Secondary | ICD-10-CM

## 2016-03-04 DIAGNOSIS — C189 Malignant neoplasm of colon, unspecified: Secondary | ICD-10-CM | POA: Insufficient documentation

## 2016-03-04 DIAGNOSIS — Z7189 Other specified counseling: Secondary | ICD-10-CM | POA: Insufficient documentation

## 2016-03-04 DIAGNOSIS — C787 Secondary malignant neoplasm of liver and intrahepatic bile duct: Secondary | ICD-10-CM

## 2016-03-04 DIAGNOSIS — Z8673 Personal history of transient ischemic attack (TIA), and cerebral infarction without residual deficits: Secondary | ICD-10-CM

## 2016-03-04 DIAGNOSIS — R1900 Intra-abdominal and pelvic swelling, mass and lump, unspecified site: Secondary | ICD-10-CM | POA: Insufficient documentation

## 2016-03-04 DIAGNOSIS — Z85528 Personal history of other malignant neoplasm of kidney: Secondary | ICD-10-CM

## 2016-03-04 DIAGNOSIS — I639 Cerebral infarction, unspecified: Secondary | ICD-10-CM

## 2016-03-04 DIAGNOSIS — R1010 Upper abdominal pain, unspecified: Secondary | ICD-10-CM

## 2016-03-04 DIAGNOSIS — E119 Type 2 diabetes mellitus without complications: Secondary | ICD-10-CM

## 2016-03-04 DIAGNOSIS — Z515 Encounter for palliative care: Secondary | ICD-10-CM | POA: Insufficient documentation

## 2016-03-04 DIAGNOSIS — R7989 Other specified abnormal findings of blood chemistry: Secondary | ICD-10-CM

## 2016-03-04 DIAGNOSIS — I6789 Other cerebrovascular disease: Secondary | ICD-10-CM

## 2016-03-04 DIAGNOSIS — I10 Essential (primary) hypertension: Secondary | ICD-10-CM

## 2016-03-04 DIAGNOSIS — R531 Weakness: Secondary | ICD-10-CM | POA: Insufficient documentation

## 2016-03-04 DIAGNOSIS — F0150 Vascular dementia without behavioral disturbance: Secondary | ICD-10-CM

## 2016-03-04 DIAGNOSIS — C185 Malignant neoplasm of splenic flexure: Principal | ICD-10-CM

## 2016-03-04 DIAGNOSIS — D72829 Elevated white blood cell count, unspecified: Secondary | ICD-10-CM

## 2016-03-04 DIAGNOSIS — C785 Secondary malignant neoplasm of large intestine and rectum: Secondary | ICD-10-CM

## 2016-03-04 LAB — BASIC METABOLIC PANEL
Anion gap: 9 (ref 5–15)
BUN: 26 mg/dL — AB (ref 6–20)
CO2: 21 mmol/L — ABNORMAL LOW (ref 22–32)
CREATININE: 1.03 mg/dL — AB (ref 0.44–1.00)
Calcium: 9 mg/dL (ref 8.9–10.3)
Chloride: 105 mmol/L (ref 101–111)
GFR calc Af Amer: >60 mL/min (ref >60)
GFR, EST NON AFRICAN AMERICAN: 57 mL/min — AB (ref >60)
Glucose, Bld: 123 mg/dL — ABNORMAL HIGH (ref 65–99)
POTASSIUM: 4 mmol/L (ref 3.5–5.1)
SODIUM: 135 mmol/L (ref 135–145)

## 2016-03-04 LAB — GLUCOSE, CAPILLARY
GLUCOSE-CAPILLARY: 120 mg/dL — AB (ref 65–99)
GLUCOSE-CAPILLARY: 127 mg/dL — AB (ref 65–99)
Glucose-Capillary: 110 mg/dL — ABNORMAL HIGH (ref 65–99)
Glucose-Capillary: 158 mg/dL — ABNORMAL HIGH (ref 65–99)

## 2016-03-04 LAB — CBC
HCT: 29.7 % — ABNORMAL LOW (ref 36.0–46.0)
Hemoglobin: 10 g/dL — ABNORMAL LOW (ref 12.0–15.0)
MCH: 29.5 pg (ref 26.0–34.0)
MCHC: 33.7 g/dL (ref 30.0–36.0)
MCV: 87.6 fL (ref 78.0–100.0)
PLATELETS: 379 10*3/uL (ref 150–400)
RBC: 3.39 MIL/uL — AB (ref 3.87–5.11)
RDW: 17.4 % — ABNORMAL HIGH (ref 11.5–15.5)
WBC: 27.5 10*3/uL — ABNORMAL HIGH (ref 4.0–10.5)

## 2016-03-04 LAB — CANCER ANTIGEN 19-9: CA 19 9: 273 U/mL — AB (ref 0–35)

## 2016-03-04 LAB — HEMOGLOBIN A1C
Hgb A1c MFr Bld: 5.4 % (ref 4.8–5.6)
Mean Plasma Glucose: 108 mg/dL

## 2016-03-04 LAB — ECHOCARDIOGRAM COMPLETE: Weight: 2400 oz

## 2016-03-04 LAB — CEA: CEA: 364.6 ng/mL — ABNORMAL HIGH (ref 0.0–4.7)

## 2016-03-04 NOTE — Progress Notes (Signed)
Nutrition Follow-up   INTERVENTION:  Provide Ensure Enlive po once daily, each supplement provides 350 kcal and 20 grams of protein Provide Boost Breeze po once daily, each supplement provides 250 kcal and 9 grams of protein Provide Boost Plus once daily, provides 360 kcal and 14 grams of protein Provide Multivitamin with minerals daily   NUTRITION DIAGNOSIS:   Inadequate oral intake related to lethargy/confusion, poor appetite as evidenced by per patient/family report, energy intake < 75% for > or equal to 1 month, moderate depletions of muscle mass.  Ongoing  GOAL:   Patient will meet greater than or equal to 90% of their needs  Unmet  MONITOR:   PO intake, Supplement acceptance, Labs, Weight trends, I & O's, Skin  REASON FOR ASSESSMENT:   Consult Assessment of nutrition requirement/status  ASSESSMENT:   65 y.o. female with medical history significant of recurrent CVAs, hypertension, probable vascular dementia, diabetes not taking her medications for the past few months brought to the ED for evaluation of weight loss, and generalized weakness for approximately one month.   Pt out of room at time for visit. Per nursing notes, pt is eating 5-25% of meals. Husband reports eating some of pt's meals. Per husband, pt drank small amounts of Boost Breeze and Boost Plus, but pt had procedures today and maybe would have consumed more otherwise. Plan to continue variety of supplements for now.   Labs: glucose ranging 120 to 161 mg/dL, low hemoglobin  Diet Order:  Diet heart healthy/carb modified Room service appropriate?: Yes; Fluid consistency:: Thin  Skin:  Reviewed, no issues  Last BM:  unknown  Height:   Ht Readings from Last 1 Encounters:  01/30/16 5\' 1"  (1.549 m)    Weight:   Wt Readings from Last 1 Encounters:  03/04/16 150 lb (68.04 kg)    Ideal Body Weight:  47.7 kg  BMI:  Body mass index is 28.36 kg/(m^2).  Estimated Nutritional Needs:   Kcal:   1450-1650  Protein:  80-90 grams  Fluid:  >/= 1.8 L/day  EDUCATION NEEDS:   No education needs identified at this time  [place], LDN Inpatient Clinical Dietitian Pager: [phone] After Hours Pager: [phone]

## 2016-03-04 NOTE — Evaluation (Signed)
Physical Therapy Evaluation Patient Details Name: [name] MRN: [id number] DOB: 1951-05-26 Today's Date: 03/04/2016   History of Present Illness  Patient is a 65 y/o female with hx of HTN, DM, depression, HLD, stroke ,CKD, probable vascular dementia presents with weight loss, loss of appetite, worsening generalized weakness to the extent that she is now bedbound. She also has significant decline in her functional/cognitive function over the past few months. CT scan of the abdomen -positive metastatic colon ca.  Clinical Impression  Patient presents with cognitive deficits- decreased attention, delayed processing, initiation, impaired safety awareness-, decreased balance and impaired mobility s/p above. Tolerated SPTx2 with assist of 2 for safety/support. Pt not a great historian and no family members present to provide information. Per pt, spouse works during the day. Reports 1 fall recently at home. Would benefit from ST SNF to maximize independence and mobilty prior to return home. Will follow acutely.    Follow Up Recommendations SNF    Equipment Recommendations  None recommended by PT    Recommendations for Other Services OT consult     Precautions / Restrictions Precautions Precautions: Fall Restrictions Weight Bearing Restrictions: No      Mobility  Bed Mobility Overal bed mobility: Needs Assistance Bed Mobility: Supine to Sit     Supine to sit: +2 for physical assistance;HOB elevated;Max assist     General bed mobility comments: Initiated BLEs but required assist to maintain attention, with trunk and to scoot bottom to EOB.   Transfers Overall transfer level: Needs assistance Equipment used: Rolling walker (2 wheeled);None Transfers: Sit to/from [facility] Sit to Stand: Mod assist;+2 physical assistance;From elevated surface Stand pivot transfers: Mod assist       General transfer comment: Stood from EOB x1 with cues for hand  placement/technique. Half stand pivot transfer from bed to [facility] and SPT from [facility] to chair, once without RW and second transfer with RW. Increased knee flexion throughout.  Ambulation/Gait Ambulation/Gait assistance:  (Deferred secondary to weakness/safety.)              Stairs            Wheelchair Mobility    Modified Rankin (Stroke Patients Only)       Balance Overall balance assessment: Needs assistance Sitting-balance support: Feet supported;No upper extremity supported Sitting balance-Leahy Scale: Poor Sitting balance - Comments: variable assist needed esp with dynamic tasks, 1 major LOB requiring Max A to get to upright. Min guard-Min A for static sitting balance. Able to assist donning gown with support. Postural control: Posterior lean Standing balance support: During functional activity Standing balance-Leahy Scale: Poor Standing balance comment: Reliant on BUEs and external support for balance. Bil knee instability.                             Pertinent Vitals/Pain Pain Assessment: No/denies pain    Home Living Family/patient expects to be discharged to:: Private residence Living Arrangements: Spouse/significant other Available Help at Discharge:  (reports he is not home all the time) Type of Home: House Home Access: Ramped entrance     Home Layout: Two level;Bed/bath upstairs Home Equipment: [place] - 2 wheels;Wheelchair - [employer];Shower seat      Prior Function Level of Independence: Needs assistance   Gait / Transfers Assistance Needed: Per chart, mostly bedbound for the last month. Slowly going downhill for the last 4 months per chart.  Hand Dominance        Extremity/Trunk Assessment   Upper Extremity Assessment: Defer to OT evaluation           Lower Extremity Assessment: Generalized weakness         Communication   Communication: Other (comment) (minimal verbalizations)  Cognition  Arousal/Alertness: Awake/alert Behavior During Therapy: Flat affect Overall Cognitive Status: Impaired/Different from baseline Area of Impairment: Orientation;Attention;Following commands;Problem solving;Safety/judgement   Current Attention Level: Sustained (focused progressing to sustained at times)   Following Commands: Follows one step commands with increased time (multi modal cues) Safety/Judgement: Decreased awareness of safety;Decreased awareness of deficits   Problem Solving: Slow processing;Decreased initiation;Requires verbal cues;Requires tactile cues General Comments: "can we go into the bathroom?" and pt barely able to stand. Minimal verbalizations. Nodding yes or no at times. Delayed processing and response.    General Comments      Exercises        Assessment/Plan    PT Assessment Patient needs continued PT services  PT Diagnosis Difficulty walking;Generalized weakness;Altered mental status   PT Problem List Decreased strength;Decreased cognition;Decreased activity tolerance;Decreased balance;Decreased mobility;Decreased safety awareness;Decreased knowledge of use of DME  PT Treatment Interventions Balance training;Gait training;Functional mobility training;Therapeutic activities;Therapeutic exercise;Wheelchair mobility training;Patient/family education;Stair training;DME instruction   PT Goals (Current goals can be found in the Care Plan section) Acute Rehab PT Goals Patient Stated Goal: none stated PT Goal Formulation: With patient Time For Goal Achievement: 03/18/16 Potential to Achieve Goals: Fair    Frequency Min 3X/week   Barriers to discharge Decreased caregiver support Spouse works during the day    Co-evaluation PT/OT/SLP Co-Evaluation/Treatment: Yes Reason for Co-Treatment: Complexity of the patient's impairments (multi-system involvement);For patient/therapist safety           End of Session Equipment Utilized During Treatment: Gait  belt Activity Tolerance: Patient tolerated treatment well Patient left: in chair;with call bell/phone within reach;with chair alarm set Nurse Communication: Mobility status         Time: 1030-1053 PT Time Calculation (min) (ACUTE ONLY): 23 min   Charges:   PT Evaluation $PT Eval Moderate Complexity: 1 Procedure     PT G Codes:        Isabel Ardila A Trevionne Advani 03/04/2016, 11:38 AM Wray Kearns, PT, DPT [phone]

## 2016-03-04 NOTE — Evaluation (Addendum)
Occupational Therapy Evaluation Patient Details Name: [name] MRN: [id number] DOB: 1950/11/25 Today's Date: 03/04/2016    History of Present Illness Patient is a 65 y.o. female with hx of HTN, DM, depression, HLD, stroke ,CKD, probable vascular dementia, SOB, arthritis, dizziness, weakness, heart attack, and renal cancer who presented with weight loss, loss of appetite, worsening generalized weakness to the extent that she is now bedbound. She also has significant decline in her functional/cognitive function over the past few months. CT scan of the abdomen -positive probable metastatic colon cancer.   Clinical Impression   Pt admitted with above. Unsure of pt's true PLOF. Feel pt will benefit from acute OT to increase independence. Recommending SNF for d/c-pt's spouse not present during evaluation.    Follow Up Recommendations  SNF;Supervision/Assistance - 24 hour    Equipment Recommendations  Other (comment) (defer to next venue)    Recommendations for Other Services       Precautions / Restrictions Precautions Precautions: Fall Restrictions Weight Bearing Restrictions: No      Mobility Bed Mobility Overal bed mobility: Needs Assistance Bed Mobility: Supine to Sit     Supine to sit: +2 for physical assistance;HOB elevated;Max assist     General bed mobility comments: assist with trunk and LEs.  Transfers Overall transfer level: Needs assistance Equipment used: Rolling walker (2 wheeled);None Transfers: Sit to/from Stand Sit to Stand: Mod assist;+2 physical assistance;From elevated surface;+2 safety/equipment Stand pivot transfers: Mod assist;+2 physical assistance (+2 Mod A-in between stand/squat pivot without RW)       General transfer comment: Stood from EOB x1 with cues for hand placement/technique. Half stand pivot transfer from bed to [facility] and SPT from [facility] to chair, once without RW and second transfer with RW. Increased knee flexion throughout.     Balance Overall balance assessment: Needs assistance Sitting-balance support: Feet supported;No upper extremity supported Sitting balance-Leahy Scale: Poor Sitting balance - Comments: variable assist needed especially with dynamic tasks, 1 major LOB requiring Max A to get to upright. Min guard-Min A for static sitting balance. Able to assist donning gown with support. Postural control: Posterior lean Standing balance support: During functional activity Standing balance-Leahy Scale: Poor Standing balance comment: Reliant on BUEs and external support for balance. Bil knee instability.                            ADL Overall ADL's : Needs assistance/impaired   Eating/Feeding Details (indicate cue type and reason): able to drink out of cup without difficulty when laying in bed; OT placed in pt's hand Grooming: Wash/dry face;Sitting;Minimal assistance (pt able to wash off face with set up assist when sitting.)           Upper Body Dressing : Moderate assistance;Sitting   Lower Body Dressing: Total assistance;Sit to/from stand;+2 for physical assistance   Toilet Transfer: +2 for physical assistance;Moderate assistance;Stand-pivot;BSC;RW   Toileting- Clothing Manipulation and Hygiene: Total assistance;Sit to/from stand;+2 for physical assistance       Functional mobility during ADLs: +2 for physical assistance;Moderate assistance (transferred with and without RW/ stand pivot and sit to stand as well as stand/squat pivot transfer)       Vision     Perception     Praxis      Pertinent Vitals/Pain Pain Assessment: Faces Faces Pain Scale: Hurts even more Pain Location: Lt UE when OT moved shoulder Pain Descriptors / Indicators: Grimacing Pain Intervention(s): Monitored during session  Hand Dominance     Extremity/Trunk Assessment Upper Extremity Assessment Upper Extremity Assessment: LUE deficits/detail LUE Deficits / Details: limited ROM of left shoulder; pt  performed minimal shoulder flexion; appeared in pain when OT moved left shoulder.    Lower Extremity Assessment Lower Extremity Assessment: Defer to PT evaluation       Communication Communication Communication: Other (comment) (minimal verbalizations)   Cognition Arousal/Alertness: Awake/alert Behavior During Therapy: Flat affect Overall Cognitive Status: No family/caregiver present to determine baseline cognitive functioning Area of Impairment: Orientation;Attention;Following commands;Problem solving;Safety/judgement Orientation Level: Disoriented to;Place Current Attention Level:  (focused-sustained)   Following Commands: Follows one step commands with increased time Safety/Judgement: Decreased awareness of safety;Decreased awareness of deficits   Problem Solving: Slow processing;Decreased initiation;Requires verbal cues;Requires tactile cues General Comments: "can we go into the bathroom?" and pt barely able to stand. Minimal verbalizations.   General Comments       Exercises       Shoulder Instructions      Home Living Family/patient expects to be discharged to:: Unsure Living Arrangements: Spouse/significant other Available Help at Discharge: Family;Available PRN/intermittently Type of Home: House Home Access: Ramped entrance     Home Layout: Two level;Bed/bath upstairs Alternate Level Stairs-Number of Steps: 13 Alternate Level Stairs-Rails: Right Bathroom Shower/Tub: Teacher, early years/pre: Standard     Home Equipment: Environmental consultant - 2 wheels;Wheelchair - [employer];Shower seat   Additional Comments: unsure of accuracy of information      Prior Functioning/Environment Level of Independence: Needs assistance  Gait / Transfers Assistance Needed: Per chart, mostly bedbound for the last month. Going downhill for the last few months.           OT Diagnosis: Generalized weakness   OT Problem List: Decreased cognition;Pain;Impaired UE  functional use;Decreased knowledge of precautions;Decreased knowledge of use of DME or AE;Decreased safety awareness;Impaired balance (sitting and/or standing);Decreased activity tolerance;Decreased strength;Decreased range of motion   OT Treatment/Interventions: DME and/or AE instruction;Therapeutic activities;Cognitive remediation/compensation;Patient/family education;Balance training;Self-care/ADL training;Therapeutic exercise    OT Goals(Current goals can be found in the care plan section) Acute Rehab OT Goals Patient Stated Goal: none stated OT Goal Formulation: With patient Time For Goal Achievement: 03/18/16 Potential to Achieve Goals: Fair  OT Frequency: Min 2X/week   Barriers to D/C:            Co-evaluation PT/OT/SLP Co-Evaluation/Treatment: Yes Reason for Co-Treatment: For patient/therapist safety;Complexity of the patient's impairments (multi-system involvement)   OT goals addressed during session: ADL's and self-care;Other (comment) (mobility)      End of Session Equipment Utilized During Treatment: Gait belt;Rolling walker  Activity Tolerance: Patient tolerated treatment well Patient left: in chair;with call bell/phone within reach;with chair alarm set   Time: [id number] OT Time Calculation (min): 20 min Charges:  OT General Charges $OT Visit: 1 Procedure OT Evaluation $OT Eval Moderate Complexity: 1 Procedure G-CodesBenito Mccreedy OTR/L [id number] 03/04/2016, 1:39 PM

## 2016-03-04 NOTE — Progress Notes (Signed)
PROGRESS NOTE        PATIENT DETAILS Name: [name] Age: 65 y.o. Sex: female Date of Birth: [birth year] Admit Date: 03/02/2016 Admitting Physician Evalee Mutton Kristeen Mans, MD Browns:[id number] Bunnie Domino, MD Outpatient Specialists:Dr Jannifer Franklin  Brief Narrative: Patient is a 66 y.o. female medical history significant of recurrent CVAs, hypertension, probable vascular dementia, diabetes, noncompliant with her medications for the past few months-brought to the ED forr evaluation of weight loss, loss of appetite, worsening generalized weakness to the extent that she is now bedbound. She is also had significant decline in her functional and cognitive function over the past few months. CT scan of the abdomen positive probable metastatic colon cancer.   Subjective: Pleasantly confused. Answers some questions appropriately.No family at bedside  Assessment/Plan: Active Problems: Failure to thrive syndrome: Likely secondary to metastatic colon cancer. Given the rapid decline in cognitive and functional status, have consulted palliative care for of care before proceeding with any further workup at this point.Recommendations are to proceed with a Oncology consultation to assess whether patient is even a candidate for palliative chemotherapy given very poor functional/cognitive status-if deemed to be a candidate for treatment-will then proceed with diagnostic evaluation, otherwise will need to engage family and transition to comfort measures.   Leukocytosis: UA/chest x-ray/CT abd negative for any infective foci. Blood cultures negative so far.No foci evident on exam . Afebrile-however leukocytosis continues to worsen today. Check CT Chest to see if patient has post obstructive PNA/necrotic lung mass.Since stable-afebrile- continue to monitor off antibiotics at this time.  AKI (with solitary kidney): History of present illness likely secondary to prerenal azotemia in a setting of poor oral  intake and dehydration. Continue to gently hydrate. Avoid nephrotoxic agents.  History of recurrent CVA: Unfortunately noncompliant with antiplatelets including Plavix for the past few months. Continue Plavix for now.  History of left nephrectomy: Remote history-spouse not aware of exactly why nephrectomy done-but he suspects malignancy was the cause.  History of type 2 diabetes: Noncompliant with oral hypoglycemics-off meds for the past few months-as very poor oral intake-CBGs currently stable with SSI. Avoid restarting oral hypoglycemics at this point.  Hypertension: Noncompliant with antihypertensives-was not taking any medications for the past few months-blood pressure slightly elevated-continue to monitor off antihypertensives for now. Continue as needed hydralazine.  Suspected dementia/hypersomnolence: Follows with neurology in the outpatient setting-suspect excessive sleepiness-likely part of failure to thrive syndrome and natural decline of this patient.  Severe protein calorie malnutrition: Prealbumin of 2.8!!Nutrition consult appreciated-continue supplementes  Palliative care: Unfortunate 65 year old with history of recurrent CVA-she has had significant decline in her functional and cognitive status in the past few months. She is stopped taking all of her medications. She has had significant loss of appetite and weight loss over the past few months-her generalized weakness has worsened to the extent that she has been bedbound for the past 1 month. Further evaluation revealed probable metastatic colon cancer. Per spouse-patient has been refusing oral intake and any medications-she also has a solitary functioning kidney. At this time, not sure if patient can even tolerate colonoscopy preparation. Given mild AKI with a solitary kidney-CT scans without contrast will need to be done to stage the cancer. Given all these complexities-and very poor overall prognoses-we have consulted palliative care  prior to initiation of any workup.Palliative care has started to engage family-we are awaiting oncology opinion at  this time to see if patient would be a candidate for palliative chemotx. This M.D. has had a long discussion with the patient's spouse on multiple occasions, he is aware of very poor overall prognoses and the above noted complexities.  DVT Prophylaxis: Prophylactic Lovenox   Code Status: Full code  Family Communication: None at bedside this am  Disposition Plan: Remain inpatient-suspect will require SNF or residential hospice vs Home Hospice on discharge.   Antimicrobial agents: None  Procedures: None  CONSULTS:  Palliative  Time spent: 25 minutes-Greater than 50% of this time was spent in counseling, explanation of diagnosis, planning of further management, and coordination of care.  MEDICATIONS: Anti-infectives    None      Scheduled Meds: . clopidogrel  75 mg Oral Daily  . enoxaparin (LOVENOX) injection  40 mg Subcutaneous Q24H  . feeding supplement  1 Container Oral Q24H  . feeding supplement (ENSURE ENLIVE)  237 mL Oral Q24H  . insulin aspart  0-9 Units Subcutaneous TID WC  . lactose free nutrition  237 mL Oral Q24H  . multivitamin with minerals  1 tablet Oral Daily  . sertraline  50 mg Oral Daily  . sodium chloride flush  3 mL Intravenous Q12H   Continuous Infusions: . sodium chloride 75 mL/hr at 03/04/16 0653   PRN Meds:.acetaminophen **OR** acetaminophen, albuterol, hydrALAZINE, ondansetron **OR** ondansetron (ZOFRAN) IV, polyethylene glycol   PHYSICAL EXAM: Vital signs: Filed Vitals:   03/04/16 0141 03/04/16 0449 03/04/16 0608 03/04/16 0900  BP: 149/100 145/86  123/100  Pulse: 107 105  108  Temp: 98.9 F (37.2 C) 97.9 F (36.6 C)  98.3 F (36.8 C)  TempSrc: Oral Axillary  Oral  Resp: 18 18    Weight:   68.04 kg (150 lb)   SpO2: 98% 95%  100%   Filed Weights   03/04/16 0608  Weight: 68.04 kg (150 lb)   Body mass index is 28.36  kg/(m^2).   Gen Exam: Awake and And pleasantly confused. Neck: Supple, No JVD. Chest: B/L Clear.   CVS: S1 S2 Regular, no murmurs.  Abdomen: soft, BS +, non tender, non distended.  Extremities: no edema, lower extremities warm to touch. Neurologic: Non Focal-but with generalized weakness.   LABORATORY DATA: CBC:  Recent Labs Lab 03/02/16 1127 03/02/16 1144 03/03/16 0431 03/04/16 0815  WBC 23.1*  --  25.8* 27.5*  NEUTROABS 18.5*  --   --   --   HGB 11.0* 13.6 9.8* 10.0*  HCT 33.7* 40.0 30.3* 29.7*  MCV 86.9  --  88.3 87.6  PLT 365  --  354 [id number]    Basic Metabolic Panel:  Recent Labs Lab 03/02/16 1127 03/02/16 1144 03/03/16 0431 03/04/16 0815  NA 134* 135 133* 135  K 4.2 4.2 4.3 4.0  CL 97* 100* 100* 105  CO2 21*  --  21* 21*  GLUCOSE 161* 161* 158* 123*  BUN 22* 25* 25* 26*  CREATININE 1.39* 1.30* 1.21* 1.03*  CALCIUM 9.9  --  9.0 9.0    GFR: Estimated Creatinine Clearance: 49.3 mL/min (by C-G formula based on Cr of 1.03).  Liver Function Tests:  Recent Labs Lab 03/02/16 1127 03/03/16 0431  AST 98* 68*  ALT 17 18  ALKPHOS 189* 166*  BILITOT 1.7* 1.6*  PROT 7.8 7.2  ALBUMIN 2.8* 2.4*   No results for input(s): LIPASE, AMYLASE in the last 168 hours. No results for input(s): AMMONIA in the last 168 hours.  Coagulation Profile:  Recent Labs Lab 03/02/16  1127  INR 1.20    Cardiac Enzymes: No results for input(s): CKTOTAL, CKMB, CKMBINDEX, TROPONINI in the last 168 hours.  BNP (last 3 results) No results for input(s): PROBNP in the last 8760 hours.  HbA1C: No results for input(s): HGBA1C in the last 72 hours.  CBG:  Recent Labs Lab 03/03/16 0640 03/03/16 1126 03/03/16 1631 03/03/16 2106 03/04/16 0608  GLUCAP 145* 154* 154* 132* 120*    Lipid Profile:  Recent Labs  03/03/16 0431  CHOL 138  HDL 35*  LDLCALC 86  TRIG 84  CHOLHDL 3.9    Thyroid Function Tests: No results for input(s): TSH, T4TOTAL, FREET4, T3FREE,  THYROIDAB in the last 72 hours.  Anemia Panel: No results for input(s): VITAMINB12, FOLATE, FERRITIN, TIBC, IRON, RETICCTPCT in the last 72 hours.  Urine analysis:    Component Value Date/Time   COLORURINE AMBER* 03/02/2016 1310   APPEARANCEUR CLOUDY* 03/02/2016 1310   LABSPEC 1.020 03/02/2016 1310   PHURINE 5.5 03/02/2016 1310   GLUCOSEU NEGATIVE 03/02/2016 1310   HGBUR NEGATIVE 03/02/2016 1310   BILIRUBINUR SMALL* 03/02/2016 1310   KETONESUR 15* 03/02/2016 1310   PROTEINUR 30* 03/02/2016 1310   UROBILINOGEN 0.2 01/09/2013 1151   NITRITE NEGATIVE 03/02/2016 1310   LEUKOCYTESUR NEGATIVE 03/02/2016 1310    Sepsis Labs: Lactic Acid, Venous No results found for: LATICACIDVEN  MICROBIOLOGY: Recent Results (from the past 240 hour(s))  Culture, blood (Routine X 2) w Reflex to ID Panel     Status: None (Preliminary result)   Collection Time: 03/02/16  5:45 PM  Result Value Ref Range Status   Specimen Description RIGHT ANTECUBITAL  Final   Special Requests BOTTLES DRAWN AEROBIC ONLY 5CC  Final   Culture NO GROWTH < 24 HOURS  Final   Report Status PENDING  Incomplete  Culture, blood (Routine X 2) w Reflex to ID Panel     Status: None (Preliminary result)   Collection Time: 03/02/16  5:51 PM  Result Value Ref Range Status   Specimen Description BLOOD RIGHT HAND  Final   Special Requests IN PEDIATRIC BOTTLE 3CC  Final   Culture NO GROWTH < 24 HOURS  Final   Report Status PENDING  Incomplete    RADIOLOGY STUDIES/RESULTS: Ct Abdomen Pelvis Wo Contrast  03/02/2016  CLINICAL DATA:  65 year old female with a history of abdominal pain EXAM: CT ABDOMEN AND PELVIS WITHOUT CONTRAST TECHNIQUE: Multidetector CT imaging of the abdomen and pelvis was performed following the standard protocol without IV contrast. COMPARISON:  Plain film 05/10/2008, CT 11/23/2006 FINDINGS: Lower chest: Unremarkable appearance of the soft tissues of the chest wall. Heart size within normal limits.  No pericardial  fluid/thickening. Calcifications of the coronary arteries. Pathologic adenopathy of the pericardial lymph nodes just above the diaphragm, with nodal mass measuring 3.8 cm by 1.6 cm. Diaphragmatic lymph node on the left measures 2.2 cm. Paraesophageal lymph node measures 3.2 cm by 2.2 cm. No confluent airspace disease. Several small nodules are identified within lingula, right middle lobe, right lower lobe, and left lower lobe in the sulcus. Largest measures 5 mm in the lingula. Abdomen/pelvis: Multiple nodules/masses within the liver parenchyma, new from the comparison CT of 2008. Ill-defined mineralization/calcification within the infiltrative lesions of the left lobe. Surgical changes of splenectomy. Surgical changes of lap banding. Soft tissue mass at the descending duodenum/pancreatic head, with poor definition of the small bowel and the pancreas. Soft tissue measures 4.9 cm x 4.4 cm. Ill-defined circumferential soft tissue thickening of the splenic flexure with  ill-defined mineralization/calcifications extending towards the greater curvature of the stomach. Greatest size of this soft tissue mass measures 4.7 cm x 4.4 cm. There is abrupt transition of both the transverse colon and the descending colon after the soft tissue mass. Multiple soft tissue nodule/peritoneal lymph nodes. Partially calcified/mineralized soft tissue mass in the right upper quadrant, adjacent to the greater curvature. Surgical changes of left nephrectomy. Unremarkable left adrenal gland. Unremarkable right adrenal gland. No evidence of right-sided hydronephrosis. Nonobstructive stone measures 5 mm -6 mm at the superior collecting system. Unremarkable course of the right ureter. Unremarkable appearance of the urinary bladder. Diffuse calcifications of the abdominal vasculature.  No aneurysm. Hysterectomy. No displaced fracture. Multilevel degenerative changes of the visualized thoracolumbar spine. Facet changes bilaterally. Degenerative  changes of the bilateral hips. IMPRESSION: Evidence of metastatic disease involving the liver, multiple nodal stations (including pericardial, paraesophageal, left diaphragmatic, portacaval), and peritoneum of the left upper quadrant. Mineralized/calcified mass inseparable from the left hepatic flexure is suspicious for the primary malignancy. Recommend correlation with colonoscopy. If further imaging is warranted, consider contrast-enhanced CT abdomen. Multiple small nodules of the inferior lungs, suspicious for metastases. Contrast-enhanced chest CT recommended for further evaluation. These results were called by telephone at the time of interpretation on 03/02/2016 at 5:41 pm to Dr. Sloan Leiter who verbally acknowledged these results. Surgical changes of prior left nephrectomy. Nonobstructive kidney stone of the right superior collecting system. Atherosclerosis with evidence of coronary artery disease. Signed, Dulcy Fanny. Earleen Newport, DO [facility] Radiology Electronically Signed   By: Corrie Mckusick D.O.   On: 03/02/2016 17:49   Dg Chest 2 View  03/02/2016  CLINICAL DATA:  Weakness for 2 days. EXAM: CHEST  2 VIEW COMPARISON:  07/20/2012 radiograph FINDINGS: Cardiomegaly, coronary stent and mild elevation of the right hemidiaphragm again noted. There is no evidence of focal airspace disease, pulmonary edema, suspicious pulmonary nodule/mass, pleural effusion, or pneumothorax. No acute bony abnormalities are identified. Remote left rib fractures are identified. IMPRESSION: Cardiomegaly without evidence of acute cardiopulmonary disease. Electronically Signed   By: Margarette Canada M.D.   On: 03/02/2016 18:12   Ct Head Wo Contrast  03/02/2016  CLINICAL DATA:  Increasing weakness, anorexia, hypertension, diabetes mellitus, history MI and stroke EXAM: CT HEAD WITHOUT CONTRAST TECHNIQUE: Contiguous axial images were obtained from the base of the skull through the vertex without  intravenous contrast. COMPARISON:  07/19/2012 FINDINGS: Generalized atrophy. Normal ventricular morphology. No midline shift or mass effect. Small vessel chronic ischemic changes of deep cerebral white matter. 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 Old lacunar infarcts BILATERAL thalamus. No intracranial hemorrhage, mass lesion, or acute infarction. Visualized paranasal sinuses and mastoid air cells clear. Bones unremarkable. IMPRESSION: Atrophy with small vessel chronic ischemic changes of deep cerebral white matter. 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 No acute intracranial abnormalities. Electronically Signed   By: Lavonia Dana M.D.   On: 03/02/2016 12:16     LOS: 2 days   Oren Binet, MD  [facility] Pager:[phone]  If 7PM-7AM, please contact night-coverage [email or web address] Password [email or web address] 03/04/2016, 10:37 AM

## 2016-03-04 NOTE — Progress Notes (Signed)
Pt refused boost drink at this time. Per husband, pt likes strawberry ensure. Ensure given per request.  Ave Filter, RN.

## 2016-03-04 NOTE — Consult Note (Signed)
Marland Kitchen    HEMATOLOGY/ONCOLOGY CONSULTATION NOTE  Date of Service: 03/04/2016  Patient Care Team: Nolene Ebbs, MD as PCP - General (Internal Medicine)  CHIEF COMPLAINTS/PURPOSE OF CONSULTATION:  Concern for metastatic cancer - help to define goals of care  HISTORY OF PRESENTING ILLNESS:   [name] is a wonderful 65 y.o. female who has been referred to [place] by Dr Evalee Mutton Kristeen Mans, MD  for evaluation and management of likely metastatic colon cancer in a patient with poor functional status to help further define goals of care.  Patient has a history of hypertension, diabetes, dyslipidemia, MI, recurrent CVAs, sleep apnea, previous renal cancer status post nephrectomy, single kidney, status post splenectomy who was brought to the emergency room for evaluation of weight loss, anorexia and worsening generalized weakness to point that she is now bedbound. She is also noted to have significant decline in her functional and cognitive status over the past few months and there is some concern that she might have vascular dementia as well. She has apparently been noncompliant with her medication for the past few months.  CT of the abdomen was done which was concerning for metastatic malignancy possibly colon cancer based on the fact that there was a lesion in the splenic flexure of the colon. Her CEA levels are significantly elevated at 365. CA-19-9 levels pending.  Palliative care was consulted to define goals of care even before a tissue diagnosis or complete workup was obtained since the patient has very poor functional status. Oncology has been consulted to weigh in on her goals of care and help answer family questions regarding potential management.  Patient was seen today with her husband at bedside. She notes some upper abdominal discomfort. He notes that she was driving her car and functioning quite well until late last year. She has been having memory issues starting early part of this  year and over the last several weeks has been getting increasingly weak and predominantly staying in bed 70-80% of the day. He also attributes some of her fatigue and decreased functioning tube being recently started on an SSRI and notes that she appears a little more weight after this was stopped.  I had a detailed discussion with the patient's husband about the current findings, likely diagnosis and potential approaches. I also discussed his understanding of the patient's health care priorities and challenges in her treatment.  MEDICAL HISTORY:  Past Medical History  Diagnosis Date  . Hypertension   . Diabetes mellitus   . Depression   . Hyperlipidemia   . Bronchitis   . SOB (shortness of breath)   . Arthritis   . Dizziness   . Weakness   . Sore throat   . Sinus problem   . Family history of breast cancer   . Heart attack [facility]) 2004    uses to see Dr Vidal Schwalbe- doesnt see anyone since  . Stroke [facility])     left side weaker/ uses a walker, uses most of time.  . Chronic kidney disease     possible stones - confirm with patient  . Renal cancer ([place])   . Sleep apnea     study done around 05, hasnt uesd in a whiloe  . Hemiparesis and alteration of sensations as late effects of stroke ([place]) 11/13/2015  . Occipital stroke ([place]) 01/30/2016    SURGICAL HISTORY: Past Surgical History  Procedure Laterality Date  . Spleen removal  2001  . Laparoscopic gastric banding  05/09/2008  . Abdominal hysterectomy  2002  . Cholecystectomy  1999  . Nephrectomy  2001    due to cancer  . Carpal tunnel release    .  fracture achilles Left 12/29/2012    achilles calcaneal fracture      Dr Sharol Given  . Achilles tendon surgery Left 12/29/2012    Procedure: ACHILLES TENDON REPAIR;  Surgeon: Newt Minion, MD;  Location: [place];  Service: Orthopedics;  Laterality: Left;  Resection Left Calcaneal fracture and repair Achilles    SOCIAL HISTORY: Social History   Social History  . Marital Status: Married     Spouse Name: N/A  . Number of Children: N/A  . Years of Education: N/A   Occupational History  . Not on file.   Social History Main Topics  . Smoking status: Never Smoker   . Smokeless tobacco: Never Used  . Alcohol Use: No  . Drug Use: No  . Sexual Activity: No   Other Topics Concern  . Not on file   Social History Narrative    FAMILY HISTORY: Family History  Problem Relation Age of Onset  . Diabetes Father   . Alzheimer's disease Father   . Kidney cancer Father   . Hypertension Father   . Kidney disease Father     kidney removal  . Hypertension Mother   . Arthritis Mother   . Stroke Brother   . Diabetes Brother   . Hypertension Brother   . Diabetes Sister   . Thyroid disease Sister   . Hyperlipidemia Sister   . Other Sister     thyroidectomy    ALLERGIES:  is allergic to iohexol.  MEDICATIONS:  Current Facility-Administered Medications  Medication Dose Route Frequency Provider Last Rate Last Dose  . 0.9 %  sodium chloride infusion   Intravenous Continuous Jonetta Osgood, MD 75 mL/hr at 03/04/16 [id number]    . acetaminophen (TYLENOL) tablet 650 mg  650 mg Oral Q6H PRN Shanker Kristeen Mans, MD       Or  . acetaminophen (TYLENOL) suppository 650 mg  650 mg Rectal Q6H PRN Shanker Kristeen Mans, MD      . albuterol (PROVENTIL) (2.5 MG/3ML) 0.083% nebulizer solution 2.5 mg  2.5 mg Nebulization Q2H PRN Shanker Kristeen Mans, MD      . clopidogrel (PLAVIX) tablet 75 mg  75 mg Oral Daily Jonetta Osgood, MD   75 mg at 03/04/16 0825  . enoxaparin (LOVENOX) injection 40 mg  40 mg Subcutaneous Q24H Jonetta Osgood, MD   40 mg at 03/03/16 2118  . feeding supplement (BOOST / RESOURCE BREEZE) liquid 1 Container  1 Container Oral Q24H Shanker Kristeen Mans, MD      . feeding supplement (ENSURE ENLIVE) (ENSURE ENLIVE) liquid 237 mL  237 mL Oral Q24H Jonetta Osgood, MD   237 mL at 03/03/16 1950  . hydrALAZINE (APRESOLINE) injection 10 mg  10 mg Intravenous Q6H PRN Jonetta Osgood, MD       . insulin aspart (novoLOG) injection 0-9 Units  0-9 Units Subcutaneous TID WC Jonetta Osgood, MD   2 Units at 03/03/16 1634  . lactose free nutrition (BOOST PLUS) liquid 237 mL  237 mL Oral Q24H Jonetta Osgood, MD   237 mL at 03/04/16 0825  . multivitamin with minerals tablet 1 tablet  1 tablet Oral Daily Jonetta Osgood, MD   1 tablet at 03/04/16 0825  . ondansetron (ZOFRAN) tablet 4 mg  4 mg Oral Q6H PRN Shanker Kristeen Mans, MD  Or  . ondansetron (ZOFRAN) injection 4 mg  4 mg Intravenous Q6H PRN Shanker Kristeen Mans, MD      . polyethylene glycol (MIRALAX / GLYCOLAX) packet 17 g  17 g Oral Daily PRN Jonetta Osgood, MD      . sertraline (ZOLOFT) tablet 50 mg  50 mg Oral Daily Jonetta Osgood, MD   50 mg at 03/03/16 0906  . sodium chloride flush (NS) 0.9 % injection 3 mL  3 mL Intravenous Q12H Jonetta Osgood, MD   3 mL at 03/03/16 [id number]    REVIEW OF SYSTEMS:    10 Point review of Systems was done is negative except as noted above.  PHYSICAL EXAMINATION: ECOG PERFORMANCE STATUS: 3 - Symptomatic, >50% confined to bed  . Filed Vitals:   03/04/16 0449 03/04/16 0900  BP: 145/86 123/100  Pulse: 105 108  Temp: 97.9 F (36.6 C) 98.3 F (36.8 C)  Resp: 18 18   Filed Weights   03/04/16 0608  Weight: 150 lb (68.04 kg)   .Body mass index is 28.36 kg/(m^2).  GENERAL:alert, Confused , limited short-term recall. SKIN: Appears somewhat pale  EYES: normal, conjunctiva are pink and non-injected, sclera clear OROPHARYNX:no exudate, no erythema and lips, buccal mucosa, and tongue normal  NECK: supple, no JVD, thyroid normal size, non-tender, without nodularity LYMPH:  no palpable lymphadenopathy in the cervical, axillary or inguinal LUNGS: clear to auscultation with normal respiratory effort HEART: regular rate & rhythm,  no murmurs and no lower extremity edema ABDOMEN: abdomen soft, non-tender, normoactive bowel sounds  Musculoskeletal: no cyanosis of digits and no clubbing   PSYCH: alert & oriented x 3 with fluent speech NEURO: no focal motor/sensory deficits  LABORATORY DATA:  I have reviewed the data as listed  . CBC Latest Ref Rng 03/04/2016 03/03/2016 03/02/2016  WBC 4.0 - 10.5 K/uL 27.5(H) 25.8(H) -  Hemoglobin 12.0 - 15.0 g/dL 10.0(L) 9.8(L) 13.6  Hematocrit 36.0 - 46.0 % 29.7(L) 30.3(L) 40.0  Platelets 150 - 400 K/uL 379 354 -   . CBC    Component Value Date/Time   WBC 27.5* 03/04/2016 0815   WBC 12.2* 01/30/2016 1017   WBC 14.7* 11/18/2006 0941   RBC 3.39* 03/04/2016 0815   RBC 3.57* 01/30/2016 1017   RBC 4.65 11/18/2006 0941   HGB 10.0* 03/04/2016 0815   HGB 13.7 11/18/2006 0941   HCT 29.7* 03/04/2016 0815   HCT 32.2* 01/30/2016 1017   HCT 41.4 11/18/2006 0941   PLT 379 03/04/2016 0815   PLT 507* 01/30/2016 1017   PLT 301 11/18/2006 0941   MCV 87.6 03/04/2016 0815   MCV 90 01/30/2016 1017   MCV 89.0 11/18/2006 0941   MCH 29.5 03/04/2016 0815   MCH 28.6 01/30/2016 1017   MCH 29.5 11/18/2006 0941   MCHC 33.7 03/04/2016 0815   MCHC 31.7 01/30/2016 1017   MCHC 33.1 11/18/2006 0941   RDW 17.4* 03/04/2016 0815   RDW 17.3* 01/30/2016 1017   RDW 15.2* 11/18/2006 0941   LYMPHSABS 1.8 03/02/2016 1127   LYMPHSABS 2.2 01/30/2016 1017   LYMPHSABS 4.4* 11/18/2006 0941   MONOABS 2.7* 03/02/2016 1127   MONOABS 1.4* 11/18/2006 0941   EOSABS 0.0 03/02/2016 1127   EOSABS 0.1 01/30/2016 1017   EOSABS 0.5 11/18/2006 0941   BASOSABS 0.0 03/02/2016 1127   BASOSABS 0.1 01/30/2016 1017   BASOSABS 0.2* 11/18/2006 0941     . CMP Latest Ref Rng 03/04/2016 03/03/2016 03/02/2016  Glucose 65 - 99 mg/dL 123(H) 158(H)  161(H)  BUN 6 - 20 mg/dL 26(H) 25(H) 25(H)  Creatinine 0.44 - 1.00 mg/dL 1.03(H) 1.21(H) 1.30(H)  Sodium 135 - 145 mmol/L 135 133(L) 135  Potassium 3.5 - 5.1 mmol/L 4.0 4.3 4.2  Chloride 101 - 111 mmol/L 105 100(L) 100(L)  CO2 22 - 32 mmol/L 21(L) 21(L) -  Calcium 8.9 - 10.3 mg/dL 9.0 9.0 -  Total Protein 6.5 - 8.1 g/dL - 7.2 -  Total  Bilirubin 0.3 - 1.2 mg/dL - 1.6(H) -  Alkaline Phos 38 - 126 U/L - 166(H) -  AST 15 - 41 U/L - 68(H) -  ALT 14 - 54 U/L - 18 -     RADIOGRAPHIC STUDIES: I have personally reviewed the radiological images as listed and agreed with the findings in the report. Ct Abdomen Pelvis Wo Contrast  03/02/2016  CLINICAL DATA:  65 year old female with a history of abdominal pain EXAM: CT ABDOMEN AND PELVIS WITHOUT CONTRAST TECHNIQUE: Multidetector CT imaging of the abdomen and pelvis was performed following the standard protocol without IV contrast. COMPARISON:  Plain film 05/10/2008, CT 11/23/2006 FINDINGS: Lower chest: Unremarkable appearance of the soft tissues of the chest wall. Heart size within normal limits.  No pericardial fluid/thickening. Calcifications of the coronary arteries. Pathologic adenopathy of the pericardial lymph nodes just above the diaphragm, with nodal mass measuring 3.8 cm by 1.6 cm. Diaphragmatic lymph node on the left measures 2.2 cm. Paraesophageal lymph node measures 3.2 cm by 2.2 cm. No confluent airspace disease. Several small nodules are identified within lingula, right middle lobe, right lower lobe, and left lower lobe in the sulcus. Largest measures 5 mm in the lingula. Abdomen/pelvis: Multiple nodules/masses within the liver parenchyma, new from the comparison CT of 2008. Ill-defined mineralization/calcification within the infiltrative lesions of the left lobe. Surgical changes of splenectomy. Surgical changes of lap banding. Soft tissue mass at the descending duodenum/pancreatic head, with poor definition of the small bowel and the pancreas. Soft tissue measures 4.9 cm x 4.4 cm. Ill-defined circumferential soft tissue thickening of the splenic flexure with ill-defined mineralization/calcifications extending towards the greater curvature of the stomach. Greatest size of this soft tissue mass measures 4.7 cm x 4.4 cm. There is abrupt transition of both the transverse colon and the  descending colon after the soft tissue mass. Multiple soft tissue nodule/peritoneal lymph nodes. Partially calcified/mineralized soft tissue mass in the right upper quadrant, adjacent to the greater curvature. Surgical changes of left nephrectomy. Unremarkable left adrenal gland. Unremarkable right adrenal gland. No evidence of right-sided hydronephrosis. Nonobstructive stone measures 5 mm -6 mm at the superior collecting system. Unremarkable course of the right ureter. Unremarkable appearance of the urinary bladder. Diffuse calcifications of the abdominal vasculature.  No aneurysm. Hysterectomy. No displaced fracture. Multilevel degenerative changes of the visualized thoracolumbar spine. Facet changes bilaterally. Degenerative changes of the bilateral hips. IMPRESSION: Evidence of metastatic disease involving the liver, multiple nodal stations (including pericardial, paraesophageal, left diaphragmatic, portacaval), and peritoneum of the left upper quadrant. Mineralized/calcified mass inseparable from the left hepatic flexure is suspicious for the primary malignancy. Recommend correlation with colonoscopy. If further imaging is warranted, consider contrast-enhanced CT abdomen. Multiple small nodules of the inferior lungs, suspicious for metastases. Contrast-enhanced chest CT recommended for further evaluation. These results were called by telephone at the time of interpretation on 03/02/2016 at 5:41 pm to Dr. Sloan Leiter who verbally acknowledged these results. Surgical changes of prior left nephrectomy. Nonobstructive kidney stone of the right superior collecting system. Atherosclerosis with evidence of coronary artery  disease. Signed, Dulcy Fanny. Earleen Newport, DO [facility] Radiology Electronically Signed   By: Corrie Mckusick D.O.   On: 03/02/2016 17:49   Dg Chest 2 View  03/02/2016  CLINICAL DATA:  Weakness for 2 days. EXAM: CHEST  2 VIEW COMPARISON:  07/20/2012 radiograph  FINDINGS: Cardiomegaly, coronary stent and mild elevation of the right hemidiaphragm again noted. There is no evidence of focal airspace disease, pulmonary edema, suspicious pulmonary nodule/mass, pleural effusion, or pneumothorax. No acute bony abnormalities are identified. Remote left rib fractures are identified. IMPRESSION: Cardiomegaly without evidence of acute cardiopulmonary disease. Electronically Signed   By: Margarette Canada M.D.   On: 03/02/2016 18:12   Ct Head Wo Contrast  03/02/2016  CLINICAL DATA:  Increasing weakness, anorexia, hypertension, diabetes mellitus, history MI and stroke EXAM: CT HEAD WITHOUT CONTRAST TECHNIQUE: Contiguous axial images were obtained from the base of the skull through the vertex without intravenous contrast. COMPARISON:  07/19/2012 FINDINGS: Generalized atrophy. Normal ventricular morphology. No midline shift or mass effect. Small vessel chronic ischemic changes of deep cerebral white matter. 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 Old lacunar infarcts BILATERAL thalamus. No intracranial hemorrhage, mass lesion, or acute infarction. Visualized paranasal sinuses and mastoid air cells clear. Bones unremarkable. IMPRESSION: Atrophy with small vessel chronic ischemic changes of deep cerebral white matter. 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 No acute intracranial abnormalities. Electronically Signed   By: Lavonia Dana M.D.   On: 03/02/2016 12:16    ASSESSMENT & PLAN:   65 year old female with multiple medical comorbidities including recurrent CVAs and likely vascular dementia admitted with Failure to thrive  1) failure to thrive -likely multifactorial including previous CVAs and vascular dementia and now with newly diagnosed metastatic malignancy. 2) Metastatic Malignancy - likely metastatic colon cancer with splenic flexure mass multiple liver metastases, multiple lymph node stations, peritoneum and elevated CEA levels. Complete staging workup not done at  this time. Biopsy not done currently. 3) abnormal liver functions likely due to liver metastases. 4) leukocytosis/neutrophilia - could certainly be paraneoplastic leukocytosis due to her metastatic malignancy. Would need to rule out other infections as per hospital medicine team. Plan -I discussed the current imaging findings in details with the patient's husband and the patient at bedside. -We discussed that this likely appears to be metastatic colon cancer though a definitive diagnosis is not possible without a biopsy. -Her overall ECOG performance status currently is 3 with limited rehabilitation potential given recurrent CVAs and vascular dementia as well as additional debility due to metastatic malignancy. -Her poor performance status, single kidney and multiple medical comorbidities make her a challenging candidate for any specific treatments/chemotherapy for metastatic colon cancer unless there were significant improvement in her performance status. -We discussed that the treatment would only be palliative and the central goal would be the maintenance of quality of life and chemotherapy in the setting of poor performance status would likely preclude this goal. -I mentioned to him that taking an approach of comfort cares would certainly be quite reasonable. -He notes that he understands all these elements of discussion and appreciates that this is a difficult decision for him and that he would like to talk to family before he communicates with his care team here in the hospital. -He was very grateful for the frank discussion and communicates that he really loves his wife and this is understandably difficult for him to see. We discussed that we it helps to see the situation through her eyes to  determine how she would like to approach this if she was able to tell us clearly . -I would make myself available to the team if any additional questions arise . -If the patient/her family   decided to pursue  additional workup and consideration for treatment would need to get CT of the chest and a biopsy of one of her masses either through colonoscopy or if the patient does not tolerate prepped for consideration of ultrasound guided liver mass biopsy and then follow-up in clinic with me in 1 to 2 weeks.   All of the patients questions were answered with apparent satisfaction. The patient knows to call the clinic with any problems, questions or concerns.  I spent 80 minutes counseling the patient face to face. The total time spent in the appointment was 80 minutes and more than 50% was on counseling and direct patient cares.    Sullivan Lone MD [place] AAHIVMS [facility] Hematology/Oncology Physician [facility]  (Office):       [phone] (Work cell):  [phone] (Fax):           [phone]  03/04/2016 12:43 PM

## 2016-03-04 NOTE — Progress Notes (Signed)
SLP Cancellation Note  Patient Details Name: [name] MRN: [id number] DOB: 1951-06-01   Cancelled treatment:        Unable to see pt today. Spoke with RN about her efficiency to masticate regular texture and thin liquids. RN states she is drinking well, not wanting to eat much and has not been notified of problems masticating regular texture today via tech or other  Staff (pt in testing earlier today). Will continue efforts.   Houston Siren 03/04/2016, 2:43 PM  Orbie Pyo Colvin Caroli.Ed [employer] [phone]

## 2016-03-04 NOTE — Progress Notes (Signed)
Echocardiogram 2D Echocardiogram has been performed.  Joelene Millin 03/04/2016, 10:15 AM

## 2016-03-04 NOTE — Progress Notes (Signed)
VASCULAR LAB PRELIMINARY  PRELIMINARY  PRELIMINARY  PRELIMINARY  Carotid duplex completed.     Right side 1-39% ICA stenosis. No evidence of stenosis on the left ICA.  Vertebral artery flow is antegrade.     Janifer Adie, RVT, RDMS 03/04/2016, 12:25 PM

## 2016-03-05 ENCOUNTER — Inpatient Hospital Stay (HOSPITAL_COMMUNITY): Admission: RE | Admit: 2016-03-05 | Payer: Medicare (Managed Care) | Source: Ambulatory Visit

## 2016-03-05 DIAGNOSIS — R531 Weakness: Secondary | ICD-10-CM

## 2016-03-05 DIAGNOSIS — C189 Malignant neoplasm of colon, unspecified: Secondary | ICD-10-CM

## 2016-03-05 DIAGNOSIS — Z66 Do not resuscitate: Secondary | ICD-10-CM | POA: Insufficient documentation

## 2016-03-05 LAB — GLUCOSE, CAPILLARY
GLUCOSE-CAPILLARY: 102 mg/dL — AB (ref 65–99)
GLUCOSE-CAPILLARY: 155 mg/dL — AB (ref 65–99)
GLUCOSE-CAPILLARY: 92 mg/dL (ref 65–99)
Glucose-Capillary: 113 mg/dL — ABNORMAL HIGH (ref 65–99)

## 2016-03-05 LAB — CBC
HCT: 30.5 % — ABNORMAL LOW (ref 36.0–46.0)
Hemoglobin: 10.1 g/dL — ABNORMAL LOW (ref 12.0–15.0)
MCH: 28.9 pg (ref 26.0–34.0)
MCHC: 33.1 g/dL (ref 30.0–36.0)
MCV: 87.1 fL (ref 78.0–100.0)
Platelets: 422 10*3/uL — ABNORMAL HIGH (ref 150–400)
RBC: 3.5 MIL/uL — ABNORMAL LOW (ref 3.87–5.11)
RDW: 17.5 % — AB (ref 11.5–15.5)
WBC: 21.7 10*3/uL — ABNORMAL HIGH (ref 4.0–10.5)

## 2016-03-05 MED ORDER — PEG 3350-KCL-NA BICARB-NACL 420 G PO SOLR
4000.0000 mL | Freq: Once | ORAL | Status: AC
  Administered 2016-03-06: 4000 mL via ORAL
  Filled 2016-03-05: qty 4000

## 2016-03-05 MED ORDER — BISACODYL 5 MG PO TBEC
10.0000 mg | DELAYED_RELEASE_TABLET | Freq: Once | ORAL | Status: AC
Start: 2016-03-05 — End: 2016-03-05
  Administered 2016-03-05: 10 mg via ORAL
  Filled 2016-03-05: qty 2

## 2016-03-05 NOTE — Progress Notes (Signed)
Daily Progress Note   Patient Name: [name]       Date: 03/05/2016 DOB: September 04, 1951  Age: 65 y.o. MRN#: [id number] Attending Physician: Jonetta Osgood, MD Primary Care Physician: Philis Fendt, MD Admit Date: 03/02/2016  Reason for Consultation/Follow-up: Establishing goals of care, psych support  Subjective:   - continued conversation  regarding diagnosis, prognosis, treatment options, GOC and EOL wishes.  Dr Sloan Leiter updated husband and family in [place] by telephone/conference call  -questions and concerns addressed  -family still desire biopsy for confirmation, understand the limited treatment options 2/2 to her multiple co-morbid ites and poor functional status    -discussion is now around if patient should return to [facility] for family support, or  remain in [place] at home vs facility  -open to hospice services     Length of Stay: 3  Current Medications: Scheduled Meds:  . clopidogrel  75 mg Oral Daily  . enoxaparin (LOVENOX) injection  40 mg Subcutaneous Q24H  . feeding supplement  1 Container Oral Q24H  . feeding supplement (ENSURE ENLIVE)  237 mL Oral Q24H  . insulin aspart  0-9 Units Subcutaneous TID WC  . lactose free nutrition  237 mL Oral Q24H  . multivitamin with minerals  1 tablet Oral Daily  . sertraline  50 mg Oral Daily  . sodium chloride flush  3 mL Intravenous Q12H    Continuous Infusions: . sodium chloride 50 mL/hr at 03/05/16 0819    PRN Meds: acetaminophen **OR** acetaminophen, albuterol, hydrALAZINE, ondansetron **OR** ondansetron (ZOFRAN) IV, polyethylene glycol  Physical Exam  Constitutional: She appears lethargic. She appears cachectic. She appears ill.  Cardiovascular: Normal rate, regular rhythm and normal heart  sounds.   Abdominal: She exhibits distension.  Neurological: She appears lethargic.  Skin: Skin is warm and dry.            Vital Signs: BP 155/82 mmHg  Pulse 97  Temp(Src) 98.9 F (37.2 C) (Oral)  Resp 20  Wt 67.314 kg (148 lb 6.4 oz)  SpO2 98%  LMP 12/28/2012 SpO2: SpO2: 98 % O2 Device: O2 Device: Not Delivered O2 Flow Rate:    Intake/output summary:  Intake/Output Summary (Last 24 hours) at 03/05/16 1641 Last data filed at 03/05/16 0819  Gross per 24 hour  Intake    360 ml  Output      0 ml  Net    360 ml   LBM: Last BM Date:  (PTA) Baseline Weight: Weight: 68.04 kg (150 lb) Most recent weight: Weight: 67.314 kg (148 lb 6.4 oz)       Palliative Assessment/Data:  30 % at best    Flowsheet Rows        Most Recent Value   Intake Tab    Referral Department  Hospitalist   Unit at Time of Referral  Med/Surg Unit   Palliative Care Primary Diagnosis  Neurology   Date Notified  03/02/16   Palliative Care Type  New Palliative care   Reason for referral  Clarify Goals of Care   Date of Admission  03/02/16   Date first seen by Palliative Care  03/03/16   # of days Palliative referral response time  1 Day(s)   # of days IP prior to Palliative referral  0   Clinical Assessment    Psychosocial & Spiritual Assessment    Palliative Care Outcomes       Patient Active Problem List   Diagnosis Date Noted  . Generalized weakness   . DNR (do not resuscitate) discussion   . Palliative care encounter   . Abdominal mass   . Leukocytosis   . Colon cancer metastasized to liver ([place])   . Failure to thrive (0-17) 03/02/2016  . Occipital stroke ([place]) 01/30/2016  . Hemiparesis and alteration of sensations as late effects of stroke ([place]) 11/13/2015  . Subacute confusional state 11/13/2015  . Unspecified vitamin D deficiency 01/11/2013  . Fall 01/10/2013  . Humeral fracture 01/09/2013  . Stroke ([place]) 07/21/2012  . CVA (cerebral infarction) 07/21/2012  . Left-sided weakness  07/19/2012  . HTN (hypertension) 07/19/2012  . HLD (hyperlipidemia) 07/19/2012  . Depression 07/19/2012  . Diabetes mellitus ([place]) 07/19/2012  . ALLERGIC RHINITIS 01/21/2008  . AMI 11/04/2007  . BRONCHITIS 11/04/2007  . Hypersomnia with sleep apnea 11/04/2007  . MUSCULOSKELETAL PAIN 11/04/2007  . NEPHRECTOMY, HX OF 11/04/2007  . Other acquired absence of organ 11/04/2007    Palliative Care Assessment & Plan   [name] is a 66 y.o. female with medical history significant of recurrent CVAs, hypertension, probable vascular dementia, diabetes not taking her medications for the past few months brought to the ED for evaluation of weight loss, and generalized weakness for approximately one month. For the past few months, patient has had poor appetite and worsening generalized weakness. The symptoms have been much worse for the past 1 month where she has been essentially bedbound. She has stopped eating per husband, and now non ambulatory. Per husband, patient has hypersomnolence and sleeps around 90% of the time. Denies any shortness of breath. Since patient has continued to deteriorate, patient was brought to ED for further evaluation and treatment.  Per CT likely metastatic colon cancer with metastasis to liver, pancrease and lung  Recommendations/Plan:   GI for possible biopsy for confirmation of diiagnosis  Support family in decisions related to discharge and treatment otions  Code Status:  DNR/DNI-documented today     Code Status Orders        Start     Ordered   03/02/16 1927  Full code   Continuous     03/02/16 1926    Code Status History    Date Active Date Inactive Code Status Order ID Comments User Context   01/09/2013  9:10 PM 01/13/2013  3:57 PM Full Code [id number]  Mohammad  Julious Oka, MD Inpatient   12/29/2012  5:04 PM 12/30/2012  6:09 PM Full Code [id number]  Newt Minion, MD Inpatient       Prognosis:   < 4 weeks  Discharge Planning:  To Be  Determined  Care plan was discussed with Dr Sloan Leiter  Thank you for allowing the Palliative Medicine Team to assist in the care of this patient.   Time In: 1600 Time Out: 1650 Total Time 50 min Prolonged Time Billed  no       Greater than 50%  of this time was spent counseling and coordinating care related to the above assessment and plan.  Wadie Lessen, NP  Please contact Palliative Medicine Team phone at [phone] for questions and concerns.

## 2016-03-05 NOTE — Progress Notes (Signed)
Speech Language Pathology Treatment: Dysphagia  Patient Details Name: [name] MRN: [id number] DOB: May 06, 1951 Today's Date: 03/05/2016 Time: 3358-2518 SLP Time Calculation (min) (ACUTE ONLY): 11 min  Assessment / Plan / Recommendation Clinical Impression  Pt demonstrated mildly prolonged mastication with sausage but was able to clear with min prompts to check buccal cavity to remove. No indications of aspiration during observation with breakfast. Recommend continue regular/thin and check for pocketed food. No further ST needed.    HPI HPI: [name] is a 65 y.o. female with medical history significant of recurrent CVAs, hypertension, probable vascular dementia, diabetes (non compliant for the past few months), renal cancer admitted for evaluation of weight loss, and generalized weakness for approximately one month. CXR Cardiomegaly without evidence of acute cardiopulmonary disease. CT atrophy with small vessel chronic ischemic changes of deep cerebral white matter. Old lacunar infarcts RIGHT pons and BILATERAL thalamus. No acute intracranial abnormalities. No prior ST documentation for swallow, only cognition.        SLP Plan  All goals met;Discharge SLP treatment due to (comment)     Recommendations  Diet recommendations: Regular;Thin liquid Liquids provided via: Cup;Straw Medication Administration: Whole meds with puree Supervision: Patient able to self feed Compensations: Slow rate;Small sips/bites;Lingual sweep for clearance of pocketing;Minimize environmental distractions Postural Changes and/or Swallow Maneuvers: Seated upright 90 degrees;Out of bed for meals             Oral Care Recommendations: Oral care BID Follow up Recommendations: None Plan: All goals met;Discharge SLP treatment due to (comment)     GO                Houston Siren 03/05/2016, 10:49 AM Orbie Pyo Colvin Caroli.[employer] [phone]

## 2016-03-05 NOTE — Consult Note (Signed)
[facility] Reason for consult: metastatic tumor to liver with mass at the splenic flexure Referring Physician: [facility] hospitalist. PCP: Dr. Jeanie Cooks. Primary G.I. Dr. Kermit Balo [name] is an 65 y.o. female.  HPI: 65 year old woman had colonoscopy by Dr. Michail Sermon 2008 with hyperplastic polyps removed. This was done because of a family history of colon polyps and a five-year repeat was recommended. A letter was sent to the patient and she declined the procedure. She has a history of stroke sleep apnea previous renal cancer and has undergone prior nephrectomy as well as splenectomy. She has diabetes and hypertension as well. She was brought to the emergency room due to weakness had become bedbound. A CT of the abdomen and chest revealed metastatic disease in the liver with what was appear to be a mass at the splenic flexure the colon. Her CEA was markedly elevated. She has been seen by oncology as well as by palliative care in the patient and her husband would like a tissue diagnosis to help direct therapy. Her previous surgeries include laparoscopic gastric banding for obesity 2009, cholecystectomy, nephrectomy and splenectomy as well as abdominal hysterectomy.  Past Medical History  Diagnosis Date  . Hypertension   . Diabetes mellitus   . Depression   . Hyperlipidemia   . Bronchitis   . SOB (shortness of breath)   . Arthritis   . Dizziness   . Weakness   . Sore throat   . Sinus problem   . Family history of breast cancer   . Heart attack [facility]) 2004    uses to see Dr Vidal Schwalbe- doesnt see anyone since  . Stroke [facility])     left side weaker/ uses a walker, uses most of time.  . Chronic kidney disease     possible stones - confirm with patient  . Renal cancer ([place])   . Sleep apnea     study done around 05, hasnt uesd in a whiloe  . Hemiparesis and alteration of sensations as late effects of stroke ([place]) 11/13/2015  . Occipital stroke ([place]) 01/30/2016    Past  Surgical History  Procedure Laterality Date  . Spleen removal  2001  . Laparoscopic gastric banding  05/09/2008  . Abdominal hysterectomy  2002  . Cholecystectomy  1999  . Nephrectomy  2001    due to cancer  . Carpal tunnel release    .  fracture achilles Left 12/29/2012    achilles calcaneal fracture      Dr Sharol Given  . Achilles tendon surgery Left 12/29/2012    Procedure: ACHILLES TENDON REPAIR;  Surgeon: Newt Minion, MD;  Location: [place];  Service: Orthopedics;  Laterality: Left;  Resection Left Calcaneal fracture and repair Achilles    Family History  Problem Relation Age of Onset  . Diabetes Father   . Alzheimer's disease Father   . Kidney cancer Father   . Hypertension Father   . Kidney disease Father     kidney removal  . Hypertension Mother   . Arthritis Mother   . Stroke Brother   . Diabetes Brother   . Hypertension Brother   . Diabetes Sister   . Thyroid disease Sister   . Hyperlipidemia Sister   . Other Sister     thyroidectomy    Social History:  reports that she has never smoked. She has never used smokeless tobacco. She reports that she does not drink alcohol or use illicit drugs.  Allergies:  Allergies  Allergen Reactions  .  Iohexol      Code: HIVES, Desc: Pt states in 2005 w/ a heart cath, she broke out in large hives and a rash.  She was given 45m benadryl, po.  She tolerated the procedure well w/o complication.  Thanks., Onset Date: [id number]    Medications; Prior to Admission medications   Medication Sig Start Date End Date Taking? Authorizing Provider  sertraline (ZOLOFT) 50 MG tablet Take 50 mg by mouth daily. 02/26/16   Historical Provider, MD  Vitamin D, Ergocalciferol, (DRISDOL) 50000 units CAPS capsule Reported on 03/02/2016 09/29/15   Historical Provider, MD   . clopidogrel  75 mg Oral Daily  . enoxaparin (LOVENOX) injection  40 mg Subcutaneous Q24H  . feeding supplement  1 Container Oral Q24H  . feeding supplement (ENSURE ENLIVE)  237 mL Oral  Q24H  . insulin aspart  0-9 Units Subcutaneous TID WC  . lactose free nutrition  237 mL Oral Q24H  . multivitamin with minerals  1 tablet Oral Daily  . sertraline  50 mg Oral Daily  . sodium chloride flush  3 mL Intravenous Q12H   PRN Meds acetaminophen **OR** acetaminophen, albuterol, hydrALAZINE, ondansetron **OR** ondansetron (ZOFRAN) IV, polyethylene glycol Results for orders placed or performed during the hospital encounter of 03/02/16 (from the past 48 hour(s))  Glucose, capillary     Status: Abnormal   Collection Time: 03/03/16  9:06 PM  Result Value Ref Range   Glucose-Capillary 132 (H) 65 - 99 mg/dL   Comment 1 Notify RN    Comment 2 Document in Chart   Glucose, capillary     Status: Abnormal   Collection Time: 03/04/16  6:08 AM  Result Value Ref Range   Glucose-Capillary 120 (H) 65 - 99 mg/dL   Comment 1 Notify RN    Comment 2 Document in Chart   CBC     Status: Abnormal   Collection Time: 03/04/16  8:15 AM  Result Value Ref Range   WBC 27.5 (H) 4.0 - 10.5 K/uL   RBC 3.39 (Gray) 3.87 - 5.11 MIL/uL   Hemoglobin 10.0 (Gray) 12.0 - 15.0 g/dL   HCT 29.7 (Gray) 36.0 - 46.0 %   MCV 87.6 78.0 - 100.0 fL   MCH 29.5 26.0 - 34.0 pg   MCHC 33.7 30.0 - 36.0 g/dL   RDW 17.4 (H) 11.5 - 15.5 %   Platelets 379 150 - 400 K/uL  Basic metabolic panel     Status: Abnormal   Collection Time: 03/04/16  8:15 AM  Result Value Ref Range   Sodium 135 135 - 145 mmol/Gray   Potassium 4.0 3.5 - 5.1 mmol/Gray   Chloride 105 101 - 111 mmol/Gray   CO2 21 (Gray) 22 - 32 mmol/Gray   Glucose, Bld 123 (H) 65 - 99 mg/dL   BUN 26 (H) 6 - 20 mg/dL   Creatinine, Ser 1.03 (H) 0.44 - 1.00 mg/dL   Calcium 9.0 8.9 - 10.3 mg/dL   GFR calc non Af Amer 57 (Gray) >60 mL/min   GFR calc Af Amer >60 >60 mL/min    Comment: (NOTE) The eGFR has been calculated using the CKD EPI equation. This calculation has not been validated in all clinical situations. eGFR's persistently <60 mL/min signify possible Chronic Kidney Disease.    Anion  gap 9 5 - 15  Glucose, capillary     Status: Abnormal   Collection Time: 03/04/16 11:14 AM  Result Value Ref Range   Glucose-Capillary 127 (H) 65 - 99 mg/dL  Glucose, capillary     Status: Abnormal   Collection Time: 03/04/16  4:38 PM  Result Value Ref Range   Glucose-Capillary 110 (H) 65 - 99 mg/dL  Glucose, capillary     Status: Abnormal   Collection Time: 03/04/16  9:26 PM  Result Value Ref Range   Glucose-Capillary 158 (H) 65 - 99 mg/dL   Comment 1 Notify RN    Comment 2 Document in Chart   Glucose, capillary     Status: Abnormal   Collection Time: 03/05/16  6:28 AM  Result Value Ref Range   Glucose-Capillary 102 (H) 65 - 99 mg/dL   Comment 1 Notify RN    Comment 2 Document in Chart   CBC     Status: Abnormal   Collection Time: 03/05/16  7:53 AM  Result Value Ref Range   WBC 21.7 (H) 4.0 - 10.5 K/uL   RBC 3.50 (Gray) 3.87 - 5.11 MIL/uL   Hemoglobin 10.1 (Gray) 12.0 - 15.0 g/dL   HCT 30.5 (Gray) 36.0 - 46.0 %   MCV 87.1 78.0 - 100.0 fL   MCH 28.9 26.0 - 34.0 pg   MCHC 33.1 30.0 - 36.0 g/dL   RDW 17.5 (H) 11.5 - 15.5 %   Platelets 422 (H) 150 - 400 K/uL  Glucose, capillary     Status: Abnormal   Collection Time: 03/05/16 11:05 AM  Result Value Ref Range   Glucose-Capillary 155 (H) 65 - 99 mg/dL  Glucose, capillary     Status: None   Collection Time: 03/05/16  4:52 PM  Result Value Ref Range   Glucose-Capillary 92 65 - 99 mg/dL    Ct Chest Wo Contrast  03/04/2016  CLINICAL DATA:  Evidence of metastatic colon carcinoma by prior CT with small bilateral lung nodules that the visualized lung bases. IV contrast was not administered due to history of contrast allergy and prior nephrectomy. EXAM: CT CHEST WITHOUT CONTRAST TECHNIQUE: Multidetector CT imaging of the chest was performed following the standard protocol without IV contrast. COMPARISON:  CT of the abdomen and pelvis on 03/02/2016 FINDINGS: Mediastinum/Nodes: Metastatic disease in the chest is present with soft tissue masses in  the mediastinum. There is a roughly 1.7 x 2.0 cm mass and an immediate adjacent 1.7 cm mass located in the right infrahilar region just lateral to the right atrium. Elongated soft tissue mass in the anterior mediastinum abutting the pericardium axial may be 2 adjacent masses and measures in conglomerate dimensions approximately 1.6 x 4.3 cm. Cardiophrenic angle mass seen on the abdominal CT measures 1.4 x 2.3 cm. Paraesophageal mass in the posterior mediastinum just above the diaphragm measures approximately 2.6 x 4.0 cm. Proximal mildly prominent right hilar lymph node tissue measuring roughly 1.2 cm in short axis. Coronary atherosclerosis present with calcified plaque in a 3 vessel distribution. The heart size is normal. No pericardial fluid present. The thoracic aorta is normal in caliber. Lungs/Pleura: The only discrete lung nodule is the inferior lingular nodule seen by abdominal CT measuring approximately 3 x 5 mm. This nodule is noncalcified. No other discrete pulmonary nodules are identified. There is mild atelectasis at both lung bases. Minimal trace left pleural effusion. No edema or airspace consolidation. No airway obstruction. Upper abdomen: Visualized upper abdomen shows a laparoscopic band and calcified lesions in the visible upper liver. Musculoskeletal: No bone metastases or fractures are identified in the chest. IMPRESSION: 1. Metastatic soft tissue masses in the mediastinum most likely representing metastatic lymphadenopathy. The paraesophageal mass may represent  direct extension of tumor through the hiatus. 2. The only discrete pulmonary nodule is a well-circumscribed 3 x 5 mm nodule in the inferior lingula which may be benign. 3. Coronary atherosclerosis with calcified plaque in a 3 vessel distribution. Electronically Signed   By: Aletta Edouard M.D.   On: 03/04/2016 19:08               Blood pressure 155/82, pulse 97, temperature 98.9 F (37.2 C), temperature source Oral, resp.  rate 20, weight 67.314 kg (148 lb 6.4 oz), last menstrual period 12/28/2012, SpO2 98 %.  Physical exam:   General-- frail appearing African-American female   Heart-- regular rate and rhythm without murmurs are gallops Lungs-- clear Abdomen-- soft and nondistended    Assessment: 1. Metastatic tumor to liver. There appears to be a mass at the splenic flexure so this could well be:Marland Kitchen She also has had a history of renal cancer. The family would like a tissue diagnosis so I think colonoscopy would be reasonable 2. History of renal cancer status post nephrectomy 3. Status post splenectomy, gastric banding cholecystectomy.  Plan: have discussed with patient husband. We will tentatively planned colonoscopy on Friday and plan to start prep tomorrow. The exact time which will be scheduled Friday is unclear so will try to go ahead and sort that out tomorrow.   [name],[name] 03/05/2016, 5:22 PM   This note was created using voice recognition software and minor errors may Have occurred unintentionally. Pager: [phone] If no answer or after hours call [phone]

## 2016-03-05 NOTE — Progress Notes (Signed)
PROGRESS NOTE        PATIENT DETAILS Name: [name] Age: 65 y.o. Sex: female Date of Birth: 1951/06/27 Admit Date: 03/02/2016 Admitting Physician Evalee Mutton Kristeen Mans, MD SV:[id number] Bunnie Domino, MD Outpatient Specialists:Dr Jannifer Franklin  Brief Narrative: Patient is a 65 y.o. female medical history significant of recurrent CVAs, hypertension, probable vascular dementia, diabetes, noncompliant with her medications for the past few months-brought to the ED forr evaluation of weight loss, loss of appetite, worsening generalized weakness to the extent that she is now bedbound. She is also had significant decline in her functional and cognitive function over the past few months. CT scan of the abdomen positive probable metastatic colon cancer.   Subjective: Pleasantly confused. Answers some questions appropriately.No major issues overnight  Assessment/Plan: Active Problems: Failure to thrive syndrome: Likely secondary to metastatic colon cancer. Given the rapid decline in cognitive and functional status,  consulted palliative care for of care before proceeding with any further workup at this point.Recommendations were to proceed with a Oncology consultation to assess whether patient is even a candidate for palliative chemotherapy given very poor functional/cognitive status. After d/w Oncology by family-spouse want to go ahead and see if we can do a colonoscopy-will consult GI today, given dementia-poor oral intake-colonoscopy prep will be challenging, and if not possible-may need a liver biopsy.   Probable Metastatic Colon Cancer:see above  Leukocytosis: UA/chest x-ray/CT abd and chest negative for any infective foci. Blood cultures negative so far.No foci evident on exam . Afebrile- continue to monitor off antibiotics at this time.  AKI (with solitary kidney): History of present illness likely secondary to prerenal azotemia in a setting of poor oral intake and dehydration.  Continue to gently hydrate. Avoid nephrotoxic agents.  History of recurrent CVA: Unfortunately noncompliant with antiplatelets including Plavix for the past few months. Continue Plavix for now.  History of left nephrectomy: Remote history-spouse not aware of exactly why nephrectomy done-but he suspects malignancy was the cause.  History of type 2 diabetes: Noncompliant with oral hypoglycemics-off meds for the past few months-as very poor oral intake-CBGs currently stable with SSI. Avoid restarting oral hypoglycemics at this point.  Hypertension: Noncompliant with antihypertensives-was not taking any medications for the past few months-blood pressure slightly elevated-continue to monitor off antihypertensives for now. Continue as needed hydralazine.  Suspected dementia/hypersomnolence: Follows with neurology in the outpatient setting-suspect excessive sleepiness-likely part of failure to thrive syndrome and natural decline of this patient.  Severe protein calorie malnutrition: Prealbumin of 2.8!!Nutrition consult appreciated-continue supplementes  Palliative care: Unfortunate 65 year old with history of recurrent CVA-she has had significant decline in her functional and cognitive status in the past few months. She is stopped taking all of her medications. She has had significant loss of appetite and weight loss over the past few months-her generalized weakness has worsened to the extent that she has been bedbound for the past 1 month. Further evaluation revealed probable metastatic colon cancer. Per spouse-patient has been refusing oral intake and any medications-she also has a solitary functioning kidney. At this time, not sure if patient can even tolerate colonoscopy preparation. Given mild AKI with a solitary kidney-CT scans without contrast will need to be done to stage the cancer. Given all these complexities-and very poor overall prognoses-we have consulted palliative care prior to initiation of  any workup.Palliative care has started to engage family-and had oncology see  patient-however at this time spouse wants to go ahead and see if we can do a biopsy at this time.This M.D. has had a long discussion with the patient's spouse on multiple occasions, he is aware of very poor overall prognoses and the above noted complexities.  DVT Prophylaxis: Prophylactic Lovenox   Code Status: Full code  Family Communication: Spouse over the phone  Disposition Plan: Remain inpatient-suspect will require SNF or residential hospice vs Home Hospice on discharge.   Antimicrobial agents: None  Procedures: None  CONSULTS:  Palliative   Oncology  GI   Time spent: 25 minutes-Greater than 50% of this time was spent in counseling, explanation of diagnosis, planning of further management, and coordination of care.  MEDICATIONS: Anti-infectives    None      Scheduled Meds: . clopidogrel  75 mg Oral Daily  . enoxaparin (LOVENOX) injection  40 mg Subcutaneous Q24H  . feeding supplement  1 Container Oral Q24H  . feeding supplement (ENSURE ENLIVE)  237 mL Oral Q24H  . insulin aspart  0-9 Units Subcutaneous TID WC  . lactose free nutrition  237 mL Oral Q24H  . multivitamin with minerals  1 tablet Oral Daily  . sertraline  50 mg Oral Daily  . sodium chloride flush  3 mL Intravenous Q12H   Continuous Infusions: . sodium chloride 50 mL/hr at 03/05/16 0819   PRN Meds:.acetaminophen **OR** acetaminophen, albuterol, hydrALAZINE, ondansetron **OR** ondansetron (ZOFRAN) IV, polyethylene glycol   PHYSICAL EXAM: Vital signs: Filed Vitals:   03/05/16 0113 03/05/16 0500 03/05/16 0545 03/05/16 0917  BP: 164/92  156/116 142/84  Pulse: 94  98 110  Temp: 99 F (37.2 C)  98.7 F (37.1 C) 99.1 F (37.3 C)  TempSrc: Oral  Oral Oral  Resp: 18  18 20   Weight:  67.314 kg (148 lb 6.4 oz)    SpO2: 99%  99% 99%   Filed Weights   03/04/16 0608 03/05/16 0500  Weight: 68.04 kg (150 lb) 67.314 kg (148  lb 6.4 oz)   Body mass index is 28.05 kg/(m^2).   Gen Exam: Awake and And pleasantly confused. Neck: Supple, No JVD. Chest: B/L Clear.   CVS: S1 S2 Regular, no murmurs.  Abdomen: soft, BS +, non tender, non distended.  Extremities: no edema, lower extremities warm to touch. Neurologic: Non Focal-but with generalized weakness.   LABORATORY DATA: CBC:  Recent Labs Lab 03/02/16 1127 03/02/16 1144 03/03/16 0431 03/04/16 0815 03/05/16 0753  WBC 23.1*  --  25.8* 27.5* 21.7*  NEUTROABS 18.5*  --   --   --   --   HGB 11.0* 13.6 9.8* 10.0* 10.1*  HCT 33.7* 40.0 30.3* 29.7* 30.5*  MCV 86.9  --  88.3 87.6 87.1  PLT 365  --  354 379 422*    Basic Metabolic Panel:  Recent Labs Lab 03/02/16 1127 03/02/16 1144 03/03/16 0431 03/04/16 0815  NA 134* 135 133* 135  K 4.2 4.2 4.3 4.0  CL 97* 100* 100* 105  CO2 21*  --  21* 21*  GLUCOSE 161* 161* 158* 123*  BUN 22* 25* 25* 26*  CREATININE 1.39* 1.30* 1.21* 1.03*  CALCIUM 9.9  --  9.0 9.0    GFR: Estimated Creatinine Clearance: 49.1 mL/min (by C-G formula based on Cr of 1.03).  Liver Function Tests:  Recent Labs Lab 03/02/16 1127 03/03/16 0431  AST 98* 68*  ALT 17 18  ALKPHOS 189* 166*  BILITOT 1.7* 1.6*  PROT 7.8 7.2  ALBUMIN 2.8* 2.4*  No results for input(s): LIPASE, AMYLASE in the last 168 hours. No results for input(s): AMMONIA in the last 168 hours.  Coagulation Profile:  Recent Labs Lab 03/02/16 1127  INR 1.20    Cardiac Enzymes: No results for input(s): CKTOTAL, CKMB, CKMBINDEX, TROPONINI in the last 168 hours.  BNP (last 3 results) No results for input(s): PROBNP in the last 8760 hours.  HbA1C:  Recent Labs  03/03/16 0431  HGBA1C 5.4    CBG:  Recent Labs Lab 03/04/16 1114 03/04/16 1638 03/04/16 2126 03/05/16 0628 03/05/16 1105  GLUCAP 127* 110* 158* 102* 155*    Lipid Profile:  Recent Labs  03/03/16 0431  CHOL 138  HDL 35*  LDLCALC 86  TRIG 84  CHOLHDL 3.9    Thyroid  Function Tests: No results for input(s): TSH, T4TOTAL, FREET4, T3FREE, THYROIDAB in the last 72 hours.  Anemia Panel: No results for input(s): VITAMINB12, FOLATE, FERRITIN, TIBC, IRON, RETICCTPCT in the last 72 hours.  Urine analysis:    Component Value Date/Time   COLORURINE AMBER* 03/02/2016 1310   APPEARANCEUR CLOUDY* 03/02/2016 1310   LABSPEC 1.020 03/02/2016 1310   PHURINE 5.5 03/02/2016 1310   GLUCOSEU NEGATIVE 03/02/2016 1310   HGBUR NEGATIVE 03/02/2016 1310   BILIRUBINUR SMALL* 03/02/2016 1310   KETONESUR 15* 03/02/2016 1310   PROTEINUR 30* 03/02/2016 1310   UROBILINOGEN 0.2 01/09/2013 1151   NITRITE NEGATIVE 03/02/2016 1310   LEUKOCYTESUR NEGATIVE 03/02/2016 1310    Sepsis Labs: Lactic Acid, Venous No results found for: LATICACIDVEN  MICROBIOLOGY: Recent Results (from the past 240 hour(s))  Culture, blood (Routine X 2) w Reflex to ID Panel     Status: None (Preliminary result)   Collection Time: 03/02/16  5:45 PM  Result Value Ref Range Status   Specimen Description RIGHT ANTECUBITAL  Final   Special Requests BOTTLES DRAWN AEROBIC ONLY 5CC  Final   Culture NO GROWTH 2 DAYS  Final   Report Status PENDING  Incomplete  Culture, blood (Routine X 2) w Reflex to ID Panel     Status: None (Preliminary result)   Collection Time: 03/02/16  5:51 PM  Result Value Ref Range Status   Specimen Description BLOOD RIGHT HAND  Final   Special Requests IN PEDIATRIC BOTTLE 3CC  Final   Culture NO GROWTH 2 DAYS  Final   Report Status PENDING  Incomplete    RADIOLOGY STUDIES/RESULTS: Ct Abdomen Pelvis Wo Contrast  03/02/2016  CLINICAL DATA:  65 year old female with a history of abdominal pain EXAM: CT ABDOMEN AND PELVIS WITHOUT CONTRAST TECHNIQUE: Multidetector CT imaging of the abdomen and pelvis was performed following the standard protocol without IV contrast. COMPARISON:  Plain film 05/10/2008, CT 11/23/2006 FINDINGS: Lower chest: Unremarkable appearance of the soft tissues of  the chest wall. Heart size within normal limits.  No pericardial fluid/thickening. Calcifications of the coronary arteries. Pathologic adenopathy of the pericardial lymph nodes just above the diaphragm, with nodal mass measuring 3.8 cm by 1.6 cm. Diaphragmatic lymph node on the left measures 2.2 cm. Paraesophageal lymph node measures 3.2 cm by 2.2 cm. No confluent airspace disease. Several small nodules are identified within lingula, right middle lobe, right lower lobe, and left lower lobe in the sulcus. Largest measures 5 mm in the lingula. Abdomen/pelvis: Multiple nodules/masses within the liver parenchyma, new from the comparison CT of 2008. Ill-defined mineralization/calcification within the infiltrative lesions of the left lobe. Surgical changes of splenectomy. Surgical changes of lap banding. Soft tissue mass at the descending duodenum/pancreatic head,  with poor definition of the small bowel and the pancreas. Soft tissue measures 4.9 cm x 4.4 cm. Ill-defined circumferential soft tissue thickening of the splenic flexure with ill-defined mineralization/calcifications extending towards the greater curvature of the stomach. Greatest size of this soft tissue mass measures 4.7 cm x 4.4 cm. There is abrupt transition of both the transverse colon and the descending colon after the soft tissue mass. Multiple soft tissue nodule/peritoneal lymph nodes. Partially calcified/mineralized soft tissue mass in the right upper quadrant, adjacent to the greater curvature. Surgical changes of left nephrectomy. Unremarkable left adrenal gland. Unremarkable right adrenal gland. No evidence of right-sided hydronephrosis. Nonobstructive stone measures 5 mm -6 mm at the superior collecting system. Unremarkable course of the right ureter. Unremarkable appearance of the urinary bladder. Diffuse calcifications of the abdominal vasculature.  No aneurysm. Hysterectomy. No displaced fracture. Multilevel degenerative changes of the visualized  thoracolumbar spine. Facet changes bilaterally. Degenerative changes of the bilateral hips. IMPRESSION: Evidence of metastatic disease involving the liver, multiple nodal stations (including pericardial, paraesophageal, left diaphragmatic, portacaval), and peritoneum of the left upper quadrant. Mineralized/calcified mass inseparable from the left hepatic flexure is suspicious for the primary malignancy. Recommend correlation with colonoscopy. If further imaging is warranted, consider contrast-enhanced CT abdomen. Multiple small nodules of the inferior lungs, suspicious for metastases. Contrast-enhanced chest CT recommended for further evaluation. These results were called by telephone at the time of interpretation on 03/02/2016 at 5:41 pm to Dr. Sloan Leiter who verbally acknowledged these results. Surgical changes of prior left nephrectomy. Nonobstructive kidney stone of the right superior collecting system. Atherosclerosis with evidence of coronary artery disease. Signed, Dulcy Fanny. Earleen Newport, DO [facility] Electronically Signed   By: Corrie Mckusick D.O.   On: 03/02/2016 17:49   Dg Chest 2 View  03/02/2016  CLINICAL DATA:  Weakness for 2 days. EXAM: CHEST  2 VIEW COMPARISON:  07/20/2012 radiograph FINDINGS: Cardiomegaly, coronary stent and mild elevation of the right hemidiaphragm again noted. There is no evidence of focal airspace disease, pulmonary edema, suspicious pulmonary nodule/mass, pleural effusion, or pneumothorax. No acute bony abnormalities are identified. Remote left rib fractures are identified. IMPRESSION: Cardiomegaly without evidence of acute cardiopulmonary disease. Electronically Signed   By: Margarette Canada M.D.   On: 03/02/2016 18:12   Ct Head Wo Contrast  03/02/2016  CLINICAL DATA:  Increasing weakness, anorexia, hypertension, diabetes mellitus, history MI and stroke EXAM: CT HEAD WITHOUT CONTRAST TECHNIQUE: Contiguous axial images were obtained  from the base of the skull through the vertex without intravenous contrast. COMPARISON:  07/19/2012 FINDINGS: Generalized atrophy. Normal ventricular morphology. No midline shift or mass effect. Small vessel chronic ischemic changes of deep cerebral white matter. 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 Old lacunar infarcts BILATERAL thalamus. No intracranial hemorrhage, mass lesion, or acute infarction. Visualized paranasal sinuses and mastoid air cells clear. Bones unremarkable. IMPRESSION: Atrophy with small vessel chronic ischemic changes of deep cerebral white matter. 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 No acute intracranial abnormalities. Electronically Signed   By: Lavonia Dana M.D.   On: 03/02/2016 12:16   Ct Chest Wo Contrast  03/04/2016  CLINICAL DATA:  Evidence of metastatic colon carcinoma by prior CT with small bilateral lung nodules that the visualized lung bases. IV contrast was not administered due to history of contrast allergy and prior nephrectomy. EXAM: CT CHEST WITHOUT CONTRAST TECHNIQUE: Multidetector CT imaging of the chest was performed following the standard protocol without IV contrast. COMPARISON:  CT of  the abdomen and pelvis on 03/02/2016 FINDINGS: Mediastinum/Nodes: Metastatic disease in the chest is present with soft tissue masses in the mediastinum. There is a roughly 1.7 x 2.0 cm mass and an immediate adjacent 1.7 cm mass located in the right infrahilar region just lateral to the right atrium. Elongated soft tissue mass in the anterior mediastinum abutting the pericardium axial may be 2 adjacent masses and measures in conglomerate dimensions approximately 1.6 x 4.3 cm. Cardiophrenic angle mass seen on the abdominal CT measures 1.4 x 2.3 cm. Paraesophageal mass in the posterior mediastinum just above the diaphragm measures approximately 2.6 x 4.0 cm. Proximal mildly prominent right hilar lymph node tissue measuring roughly 1.2 cm in short axis. Coronary atherosclerosis  present with calcified plaque in a 3 vessel distribution. The heart size is normal. No pericardial fluid present. The thoracic aorta is normal in caliber. Lungs/Pleura: The only discrete lung nodule is the inferior lingular nodule seen by abdominal CT measuring approximately 3 x 5 mm. This nodule is noncalcified. No other discrete pulmonary nodules are identified. There is mild atelectasis at both lung bases. Minimal trace left pleural effusion. No edema or airspace consolidation. No airway obstruction. Upper abdomen: Visualized upper abdomen shows a laparoscopic band and calcified lesions in the visible upper liver. Musculoskeletal: No bone metastases or fractures are identified in the chest. IMPRESSION: 1. Metastatic soft tissue masses in the mediastinum most likely representing metastatic lymphadenopathy. The paraesophageal mass may represent direct extension of tumor through the hiatus. 2. The only discrete pulmonary nodule is a well-circumscribed 3 x 5 mm nodule in the inferior lingula which may be benign. 3. Coronary atherosclerosis with calcified plaque in a 3 vessel distribution. Electronically Signed   By: Aletta Edouard M.D.   On: 03/04/2016 19:08     LOS: 3 days   Oren Binet, MD  [facility] Pager:[phone]  If 7PM-7AM, please contact night-coverage [email or web address] Password [email or web address] 03/05/2016, 12:54 PM

## 2016-03-06 ENCOUNTER — Encounter (HOSPITAL_COMMUNITY): Payer: Self-pay | Admitting: Anesthesiology

## 2016-03-06 ENCOUNTER — Telehealth: Payer: Self-pay | Admitting: Neurology

## 2016-03-06 LAB — GLUCOSE, CAPILLARY
GLUCOSE-CAPILLARY: 180 mg/dL — AB (ref 65–99)
GLUCOSE-CAPILLARY: 122 mg/dL — AB (ref 65–99)
GLUCOSE-CAPILLARY: 79 mg/dL (ref 65–99)
Glucose-Capillary: 116 mg/dL — ABNORMAL HIGH (ref 65–99)

## 2016-03-06 LAB — CBC
HEMATOCRIT: 28.8 % — AB (ref 36.0–46.0)
HEMOGLOBIN: 9.5 g/dL — AB (ref 12.0–15.0)
MCH: 28.8 pg (ref 26.0–34.0)
MCHC: 33 g/dL (ref 30.0–36.0)
MCV: 87.3 fL (ref 78.0–100.0)
PLATELETS: 406 10*3/uL — AB (ref 150–400)
RBC: 3.3 MIL/uL — AB (ref 3.87–5.11)
RDW: 17.7 % — ABNORMAL HIGH (ref 11.5–15.5)
WBC: 14.4 10*3/uL — AB (ref 4.0–10.5)

## 2016-03-06 MED ORDER — BOOST / RESOURCE BREEZE PO LIQD
1.0000 | Freq: Three times a day (TID) | ORAL | Status: DC
  Administered 2016-03-06 – 2016-03-07 (×2): 1 via ORAL
  Filled 2016-03-06 (×6): qty 1

## 2016-03-06 MED ORDER — SODIUM CHLORIDE 0.9 % IV SOLN
INTRAVENOUS | Status: DC
  Administered 2016-03-06: 21:00:00 via INTRAVENOUS

## 2016-03-06 NOTE — Progress Notes (Addendum)
PROGRESS NOTE        PATIENT DETAILS Name: SAVANNA HANDLIN Age: 65 y.o. Sex: female Date of Birth: 1951/10/06 Admit Date: 03/02/2016 Admitting Physician Evalee Mutton Kristeen Mans, MD SV:[id number] Bunnie Domino, MD Outpatient Specialists:Dr Jannifer Franklin  Brief Narrative: Patient is a 65 y.o. female medical history significant of recurrent CVAs, hypertension, probable vascular dementia, diabetes, noncompliant with her medications for the past few months-brought to the ED forr evaluation of weight loss, loss of appetite, worsening generalized weakness to the extent that she is now bedbound. She has had significant decline in her functional and cognitive status over the past few months. CT scan of the abdomen positive probable metastatic colon cancer. Workup in progress  Subjective: Sitting at bedside-confused-sipping at GoLYT[facility] prep   Assessment/Plan: Active Problems: Failure to thrive syndrome: Likely secondary to metastatic colon cancer. Given the rapid decline in cognitive and functional status, consulted palliative care consulted for goals of care before proceeding with any further workup at this point. Subsequently oncology consultation was obtained to see if oncology felt that patient would be a candidate for palliative chemotherapy. After further discussion, family wants to pursue biopsy, GI consultation obtained, plans are to proceed with colonoscopy on 5/12 if patient can complete the colonoscopy prep. If colonoscopy not possible, may need to obtain CT-guided liver biopsy.  Probable Metastatic Colon Cancer:see above  Leukocytosis: UA/chest x-ray/CT abd and chest negative for any infective foci. Blood cultures negative so far.No foci evident on exam. Leukocytosis down trending-continue to monitor off antibiotics. Afebrile- continue to monitor off antibiotics at this time.  AKI (with solitary kidney): History of present illness likely secondary to prerenal azotemia in a setting of  poor oral intake and dehydration.Avoid nephrotoxic agents.  History of recurrent CVA: Unfortunately noncompliant with antiplatelets including Plavix for the past few months. Continue Plavix for now.  History of left nephrectomy: Remote history-spouse not aware of exactly why nephrectomy done-but he suspects malignancy was the cause.  History of type 2 diabetes: Noncompliant with oral hypoglycemics-off meds for the past few months-as very poor oral intake-CBGs currently stable with SSI. Avoid restarting oral hypoglycemics at this point.  Hypertension: Noncompliant with antihypertensives-was not taking any medications for the past few months-blood pressure slightly elevated-continue to monitor off antihypertensives for now. Continue as needed hydralazine.  Suspected dementia/hypersomnolence: Follows with neurology in the outpatient setting-suspect excessive sleepiness-likely part of failure to thrive syndrome and natural decline of this patient.  Severe protein calorie malnutrition: Prealbumin of 2.8!!Nutrition consult appreciated-continue supplementes  Palliative care: Unfortunate 65 year old with history of recurrent CVA-she has had significant decline in her functional and cognitive status in the past few months. She has stopped taking all of her medications. She has had significant loss of appetite and weight loss over the past few months-her generalized weakness has worsened to the extent that she has been bedbound for the past 1 month. Further evaluation revealed probable metastatic colon cancer. Per spouse-patient has been refusing oral intake and any medications-she also has a solitary functioning kidney. At this time, not sure if patient can even tolerate colonoscopy preparation. Given mild AKI with a solitary kidney-CT scans without contrast will need to be done to stage the cancer. Given all these complexities-and very poor overall prognoses-we have consulted palliative care prior to  initiation of any workup.Palliative care has started to engage family-and had oncology see patient-however at this  time spouse wants to go ahead and see if we can do a biopsy at this time-hence colonoscopy scheduled for 5/12.This M.D. has had a long discussion with the patient's spouse on multiple occasions, he is aware of very poor overall prognoses and the above noted complexities.  DVT Prophylaxis: Prophylactic Lovenox   Code Status: DNR  Family Communication: None at bedside  Disposition Plan: Remain inpatient- Home Hospice on discharge.   Antimicrobial agents: None  Procedures: None  CONSULTS:  Palliative   Oncology  GI   Time spent: 25 minutes-Greater than 50% of this time was spent in counseling, explanation of diagnosis, planning of further management, and coordination of care.  MEDICATIONS: Anti-infectives    None      Scheduled Meds: . clopidogrel  75 mg Oral Daily  . enoxaparin (LOVENOX) injection  40 mg Subcutaneous Q24H  . feeding supplement  1 Container Oral Q24H  . feeding supplement (ENSURE ENLIVE)  237 mL Oral Q24H  . insulin aspart  0-9 Units Subcutaneous TID WC  . lactose free nutrition  237 mL Oral Q24H  . multivitamin with minerals  1 tablet Oral Daily  . sertraline  50 mg Oral Daily  . sodium chloride flush  3 mL Intravenous Q12H   Continuous Infusions: . sodium chloride 10 mL/hr at 03/06/16 1145   PRN Meds:.acetaminophen **OR** acetaminophen, albuterol, hydrALAZINE, ondansetron **OR** ondansetron (ZOFRAN) IV, polyethylene glycol   PHYSICAL EXAM: Vital signs: Filed Vitals:   03/06/16 0137 03/06/16 0500 03/06/16 0550 03/06/16 0900  BP: 164/86  164/92 141/74  Pulse: 93  92 99  Temp: 99 F (37.2 C)  98.8 F (37.1 C) 99.1 F (37.3 C)  TempSrc: Oral  Oral Oral  Resp: 17  18 18   Weight:  69.083 kg (152 lb 4.8 oz)    SpO2: 99%  100% 100%   Filed Weights   03/04/16 0608 03/05/16 0500 03/06/16 0500  Weight: 68.04 kg (150 lb) 67.314 kg  (148 lb 6.4 oz) 69.083 kg (152 lb 4.8 oz)   Body mass index is 28.79 kg/(m^2).   Gen Exam: Awake and And pleasantly confused. Neck: Supple, No JVD. Chest: B/L Clear.   CVS: S1 S2 Regular, no murmurs.  Abdomen: soft, BS +, non tender, non distended.  Extremities: no edema, lower extremities warm to touch. Neurologic: Non Focal-but with generalized weakness.   LABORATORY DATA: CBC:  Recent Labs Lab 03/02/16 1127 03/02/16 1144 03/03/16 0431 03/04/16 0815 03/05/16 0753 03/06/16 0926  WBC 23.1*  --  25.8* 27.5* 21.7* 14.4*  NEUTROABS 18.5*  --   --   --   --   --   HGB 11.0* 13.6 9.8* 10.0* 10.1* 9.5*  HCT 33.7* 40.0 30.3* 29.7* 30.5* 28.8*  MCV 86.9  --  88.3 87.6 87.1 87.3  PLT 365  --  354 379 422* 406*    Basic Metabolic Panel:  Recent Labs Lab 03/02/16 1127 03/02/16 1144 03/03/16 0431 03/04/16 0815  NA 134* 135 133* 135  K 4.2 4.2 4.3 4.0  CL 97* 100* 100* 105  CO2 21*  --  21* 21*  GLUCOSE 161* 161* 158* 123*  BUN 22* 25* 25* 26*  CREATININE 1.39* 1.30* 1.21* 1.03*  CALCIUM 9.9  --  9.0 9.0    GFR: Estimated Creatinine Clearance: 49.7 mL/min (by C-G formula based on Cr of 1.03).  Liver Function Tests:  Recent Labs Lab 03/02/16 1127 03/03/16 0431  AST 98* 68*  ALT 17 18  ALKPHOS 189* 166*  BILITOT  1.7* 1.6*  PROT 7.8 7.2  ALBUMIN 2.8* 2.4*   No results for input(s): LIPASE, AMYLASE in the last 168 hours. No results for input(s): AMMONIA in the last 168 hours.  Coagulation Profile:  Recent Labs Lab 03/02/16 1127  INR 1.20    Cardiac Enzymes: No results for input(s): CKTOTAL, CKMB, CKMBINDEX, TROPONINI in the last 168 hours.  BNP (last 3 results) No results for input(s): PROBNP in the last 8760 hours.  HbA1C: No results for input(s): HGBA1C in the last 72 hours.  CBG:  Recent Labs Lab 03/05/16 1105 03/05/16 1652 03/05/16 2222 03/06/16 0639 03/06/16 1143  GLUCAP 155* 92 113* 116* 180*    Lipid Profile: No results for  input(s): CHOL, HDL, LDLCALC, TRIG, CHOLHDL, LDLDIRECT in the last 72 hours.  Thyroid Function Tests: No results for input(s): TSH, T4TOTAL, FREET4, T3FREE, THYROIDAB in the last 72 hours.  Anemia Panel: No results for input(s): VITAMINB12, FOLATE, FERRITIN, TIBC, IRON, RETICCTPCT in the last 72 hours.  Urine analysis:    Component Value Date/Time   COLORURINE AMBER* 03/02/2016 1310   APPEARANCEUR CLOUDY* 03/02/2016 1310   LABSPEC 1.020 03/02/2016 1310   PHURINE 5.5 03/02/2016 1310   GLUCOSEU NEGATIVE 03/02/2016 1310   HGBUR NEGATIVE 03/02/2016 1310   BILIRUBINUR SMALL* 03/02/2016 1310   KETONESUR 15* 03/02/2016 1310   PROTEINUR 30* 03/02/2016 1310   UROBILINOGEN 0.2 01/09/2013 1151   NITRITE NEGATIVE 03/02/2016 1310   LEUKOCYTESUR NEGATIVE 03/02/2016 1310    Sepsis Labs: Lactic Acid, Venous No results found for: LATICACIDVEN  MICROBIOLOGY: Recent Results (from the past 240 hour(s))  Culture, blood (Routine X 2) w Reflex to ID Panel     Status: None (Preliminary result)   Collection Time: 03/02/16  5:45 PM  Result Value Ref Range Status   Specimen Description RIGHT ANTECUBITAL  Final   Special Requests BOTTLES DRAWN AEROBIC ONLY 5CC  Final   Culture NO GROWTH 3 DAYS  Final   Report Status PENDING  Incomplete  Culture, blood (Routine X 2) w Reflex to ID Panel     Status: None (Preliminary result)   Collection Time: 03/02/16  5:51 PM  Result Value Ref Range Status   Specimen Description BLOOD RIGHT HAND  Final   Special Requests IN PEDIATRIC BOTTLE 3CC  Final   Culture NO GROWTH 3 DAYS  Final   Report Status PENDING  Incomplete    RADIOLOGY STUDIES/RESULTS: Ct Abdomen Pelvis Wo Contrast  03/02/2016  CLINICAL DATA:  65 year old female with a history of abdominal pain EXAM: CT ABDOMEN AND PELVIS WITHOUT CONTRAST TECHNIQUE: Multidetector CT imaging of the abdomen and pelvis was performed following the standard protocol without IV contrast. COMPARISON:  Plain film  05/10/2008, CT 11/23/2006 FINDINGS: Lower chest: Unremarkable appearance of the soft tissues of the chest wall. Heart size within normal limits.  No pericardial fluid/thickening. Calcifications of the coronary arteries. Pathologic adenopathy of the pericardial lymph nodes just above the diaphragm, with nodal mass measuring 3.8 cm by 1.6 cm. Diaphragmatic lymph node on the left measures 2.2 cm. Paraesophageal lymph node measures 3.2 cm by 2.2 cm. No confluent airspace disease. Several small nodules are identified within lingula, right middle lobe, right lower lobe, and left lower lobe in the sulcus. Largest measures 5 mm in the lingula. Abdomen/pelvis: Multiple nodules/masses within the liver parenchyma, new from the comparison CT of 2008. Ill-defined mineralization/calcification within the infiltrative lesions of the left lobe. Surgical changes of splenectomy. Surgical changes of lap banding. Soft tissue mass at the  descending duodenum/pancreatic head, with poor definition of the small bowel and the pancreas. Soft tissue measures 4.9 cm x 4.4 cm. Ill-defined circumferential soft tissue thickening of the splenic flexure with ill-defined mineralization/calcifications extending towards the greater curvature of the stomach. Greatest size of this soft tissue mass measures 4.7 cm x 4.4 cm. There is abrupt transition of both the transverse colon and the descending colon after the soft tissue mass. Multiple soft tissue nodule/peritoneal lymph nodes. Partially calcified/mineralized soft tissue mass in the right upper quadrant, adjacent to the greater curvature. Surgical changes of left nephrectomy. Unremarkable left adrenal gland. Unremarkable right adrenal gland. No evidence of right-sided hydronephrosis. Nonobstructive stone measures 5 mm -6 mm at the superior collecting system. Unremarkable course of the right ureter. Unremarkable appearance of the urinary bladder. Diffuse calcifications of the abdominal vasculature.  No  aneurysm. Hysterectomy. No displaced fracture. Multilevel degenerative changes of the visualized thoracolumbar spine. Facet changes bilaterally. Degenerative changes of the bilateral hips. IMPRESSION: Evidence of metastatic disease involving the liver, multiple nodal stations (including pericardial, paraesophageal, left diaphragmatic, portacaval), and peritoneum of the left upper quadrant. Mineralized/calcified mass inseparable from the left hepatic flexure is suspicious for the primary malignancy. Recommend correlation with colonoscopy. If further imaging is warranted, consider contrast-enhanced CT abdomen. Multiple small nodules of the inferior lungs, suspicious for metastases. Contrast-enhanced chest CT recommended for further evaluation. These results were called by telephone at the time of interpretation on 03/02/2016 at 5:41 pm to Dr. Sloan Leiter who verbally acknowledged these results. Surgical changes of prior left nephrectomy. Nonobstructive kidney stone of the right superior collecting system. Atherosclerosis with evidence of coronary artery disease. Signed, Dulcy Fanny. Earleen Newport, DO [facility] Electronically Signed   By: Corrie Mckusick D.O.   On: 03/02/2016 17:49   Dg Chest 2 View  03/02/2016  CLINICAL DATA:  Weakness for 2 days. EXAM: CHEST  2 VIEW COMPARISON:  07/20/2012 radiograph FINDINGS: Cardiomegaly, coronary stent and mild elevation of the right hemidiaphragm again noted. There is no evidence of focal airspace disease, pulmonary edema, suspicious pulmonary nodule/mass, pleural effusion, or pneumothorax. No acute bony abnormalities are identified. Remote left rib fractures are identified. IMPRESSION: Cardiomegaly without evidence of acute cardiopulmonary disease. Electronically Signed   By: Margarette Canada M.D.   On: 03/02/2016 18:12   Ct Head Wo Contrast  03/02/2016  CLINICAL DATA:  Increasing weakness, anorexia, hypertension, diabetes mellitus,  history MI and stroke EXAM: CT HEAD WITHOUT CONTRAST TECHNIQUE: Contiguous axial images were obtained from the base of the skull through the vertex without intravenous contrast. COMPARISON:  07/19/2012 FINDINGS: Generalized atrophy. Normal ventricular morphology. No midline shift or mass effect. Small vessel chronic ischemic changes of deep cerebral white matter. 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 Old lacunar infarcts BILATERAL thalamus. No intracranial hemorrhage, mass lesion, or acute infarction. Visualized paranasal sinuses and mastoid air cells clear. Bones unremarkable. IMPRESSION: Atrophy with small vessel chronic ischemic changes of deep cerebral white matter. 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 No acute intracranial abnormalities. Electronically Signed   By: Lavonia Dana M.D.   On: 03/02/2016 12:16   Ct Chest Wo Contrast  03/04/2016  CLINICAL DATA:  Evidence of metastatic colon carcinoma by prior CT with small bilateral lung nodules that the visualized lung bases. IV contrast was not administered due to history of contrast allergy and prior nephrectomy. EXAM: CT CHEST WITHOUT CONTRAST TECHNIQUE: Multidetector CT imaging of the chest was performed following the standard protocol without IV contrast. COMPARISON:  CT of the abdomen and pelvis on 03/02/2016 FINDINGS: Mediastinum/Nodes: Metastatic disease in the chest is present with soft tissue masses in the mediastinum. There is a roughly 1.7 x 2.0 cm mass and an immediate adjacent 1.7 cm mass located in the right infrahilar region just lateral to the right atrium. Elongated soft tissue mass in the anterior mediastinum abutting the pericardium axial may be 2 adjacent masses and measures in conglomerate dimensions approximately 1.6 x 4.3 cm. Cardiophrenic angle mass seen on the abdominal CT measures 1.4 x 2.3 cm. Paraesophageal mass in the posterior mediastinum just above the diaphragm measures approximately 2.6 x 4.0 cm. Proximal mildly  prominent right hilar lymph node tissue measuring roughly 1.2 cm in short axis. Coronary atherosclerosis present with calcified plaque in a 3 vessel distribution. The heart size is normal. No pericardial fluid present. The thoracic aorta is normal in caliber. Lungs/Pleura: The only discrete lung nodule is the inferior lingular nodule seen by abdominal CT measuring approximately 3 x 5 mm. This nodule is noncalcified. No other discrete pulmonary nodules are identified. There is mild atelectasis at both lung bases. Minimal trace left pleural effusion. No edema or airspace consolidation. No airway obstruction. Upper abdomen: Visualized upper abdomen shows a laparoscopic band and calcified lesions in the visible upper liver. Musculoskeletal: No bone metastases or fractures are identified in the chest. IMPRESSION: 1. Metastatic soft tissue masses in the mediastinum most likely representing metastatic lymphadenopathy. The paraesophageal mass may represent direct extension of tumor through the hiatus. 2. The only discrete pulmonary nodule is a well-circumscribed 3 x 5 mm nodule in the inferior lingula which may be benign. 3. Coronary atherosclerosis with calcified plaque in a 3 vessel distribution. Electronically Signed   By: Aletta Edouard M.D.   On: 03/04/2016 19:08     LOS: 4 days   Oren Binet, MD  [facility] Pager:[phone]  If 7PM-7AM, please contact night-coverage [email or web address] Password [email or web address] 03/06/2016, 2:06 PM

## 2016-03-06 NOTE — Telephone Encounter (Signed)
Amy with [facility]ospice [facility] Pall[facility] is calling to see if Dr. Jannifer Franklin is going to be the attending physician while the patient is at [facility]. Please call Amy at [phone] and advise.

## 2016-03-06 NOTE — Progress Notes (Signed)
Notified by Azzie Almas,  CMRN of family request for Hospice and [place] services at home after discharge. Chart and patient information currently under review to confirm hospice eligibility.  Spoke with husband [name]   to initiate education related to hospice philosophy, services and team approach to care. Family verbalized understanding of the information provided. Per discussion plan is for the patient to hopefully have a colonoscopy and biopsy tomorrow to confirm the diagnosis  however he is doubtful that this will be able to be completed.  [facility] will follow up with this patient in the am to confirm discharge plans.    Please send signed completed DNR form home with patient.  Patient will need prescriptions for discharge comfort medications.   DME needs discussed and family requested hospital bed , OBT, BSC and W/c.   [facility] equipment manager Jewel Ysidro Evert notified and will contact [place] to arrange delivery to the home. Spouse is requesting no delivery of DME until Saturday as he needs to make room for hospital bed.     The home address has been verified and is correct in the chart;  [name] is to be  contacted to arrange time of delivery.  [facility] aware of the above.  Completed discharge summary will need to be faxed to [facility] at [phone] when final.  Please notify [facility] when patient is ready to leave unit at discharge-call [phone].  [facility] information and contact numbers have been given to Mr. Quentin Cornwall  during visit.  Above information shared with Azzie Almas,   [facility].  Please call with any questions.    Mickie Kay, [facility]  [phone]

## 2016-03-06 NOTE — Progress Notes (Signed)
Pt refuses to drink her ordered colonoscopy preop; Dr. Sloan Leiter and Dr. Oletta Lamas notified. Pt encouraged to drink; pt spouse at bedside who agree to encourage wife to drink. Will closely monitor. Delia Heady RN

## 2016-03-06 NOTE — Telephone Encounter (Signed)
I called hospice, unable to leave a message. I will be happy to be the attending physician for this patient at hospice.

## 2016-03-06 NOTE — Progress Notes (Signed)
[facility] PROGRESS NOTE Subjective patient is beginning her colonoscopy prep for tomorrow. She denies any gross bleeding or abdominal pain  Objective: Vital signs in last 24 hours: Temp:  [98.8 F (37.1 C)-100 F (37.8 C)] 99.1 F (37.3 C) (05/11 0900) Pulse Rate:  [92-104] 99 (05/11 0900) Resp:  [17-20] 18 (05/11 0900) BP: (141-164)/(74-101) 141/74 mmHg (05/11 0900) SpO2:  [98 %-100 %] 100 % (05/11 0900) Weight:  [69.083 kg (152 lb 4.8 oz)] 69.083 kg (152 lb 4.8 oz) (05/11 0500) Last BM Date:  (PTA)  Intake/Output from previous day: 05/10 0701 - 05/11 0700 In: 360 [P.O.:360] Out: -  Intake/Output this shift:    PE:  Abdomen-- soft and nontender  Lab Results:  Recent Labs  03/04/16 0815 03/05/16 0753 03/06/16 0926  WBC 27.5* 21.7* 14.4*  HGB 10.0* 10.1* 9.5*  HCT 29.7* 30.5* 28.8*  PLT 379 422* 406*   BMET  Recent Labs  03/04/16 0815  NA 135  K 4.0  CL 105  CO2 21*  CREATININE 1.03*   LFT No results for input(s): PROT, AST, ALT, ALKPHOS, BILITOT, BILIDIR, IBILI in the last 72 hours. PT/INR No results for input(s): LABPROT, INR in the last 72 hours. PANCREAS No results for input(s): LIPASE in the last 72 hours.       Studies/Results: Ct Chest Wo Contrast  03/04/2016  CLINICAL DATA:  Evidence of metastatic colon carcinoma by prior CT with small bilateral lung nodules that the visualized lung bases. IV contrast was not administered due to history of contrast allergy and prior nephrectomy. EXAM: CT CHEST WITHOUT CONTRAST TECHNIQUE: Multidetector CT imaging of the chest was performed following the standard protocol without IV contrast. COMPARISON:  CT of the abdomen and pelvis on 03/02/2016 FINDINGS: Mediastinum/Nodes: Metastatic disease in the chest is present with soft tissue masses in the mediastinum. There is a roughly 1.7 x 2.0 cm mass and an immediate adjacent 1.7 cm mass located in the right infrahilar region just lateral to the right  atrium. Elongated soft tissue mass in the anterior mediastinum abutting the pericardium axial may be 2 adjacent masses and measures in conglomerate dimensions approximately 1.6 x 4.3 cm. Cardiophrenic angle mass seen on the abdominal CT measures 1.4 x 2.3 cm. Paraesophageal mass in the posterior mediastinum just above the diaphragm measures approximately 2.6 x 4.0 cm. Proximal mildly prominent right hilar lymph node tissue measuring roughly 1.2 cm in short axis. Coronary atherosclerosis present with calcified plaque in a 3 vessel distribution. The heart size is normal. No pericardial fluid present. The thoracic aorta is normal in caliber. Lungs/Pleura: The only discrete lung nodule is the inferior lingular nodule seen by abdominal CT measuring approximately 3 x 5 mm. This nodule is noncalcified. No other discrete pulmonary nodules are identified. There is mild atelectasis at both lung bases. Minimal trace left pleural effusion. No edema or airspace consolidation. No airway obstruction. Upper abdomen: Visualized upper abdomen shows a laparoscopic band and calcified lesions in the visible upper liver. Musculoskeletal: No bone metastases or fractures are identified in the chest. IMPRESSION: 1. Metastatic soft tissue masses in the mediastinum most likely representing metastatic lymphadenopathy. The paraesophageal mass may represent direct extension of tumor through the hiatus. 2. The only discrete pulmonary nodule is a well-circumscribed 3 x 5 mm nodule in the inferior lingula which may be benign. 3. Coronary atherosclerosis with calcified plaque in a 3 vessel distribution. Electronically Signed   By: Aletta Edouard M.D.   On: 03/04/2016 19:08  Medications: I have reviewed the patient's current medications.  Assessment/Plan: 1. Metastatic cancer in the liver. There is a possible mass in the colon near the splenic flexure. And she has had previous nephrectomy for renal cancer. We will go ahead tomorrow with  colonoscopy to determine if this is truly a colon cancer.   Jaydin Boniface JR,Keanu Lesniak L 03/06/2016, 1:06 PM  This note was created using voice recognition software. Minor errors may Have occurred unintentionally.  Pager: [phone] If no answer or after hours call [phone]

## 2016-03-06 NOTE — Care Management Note (Signed)
Case Management Note  Patient Details  Name: [name] MRN: [id number] Date of Birth: 1951/07/23  Subjective/Objective:                    Action/Plan: Plan is for Colonoscopy for tumor biopsy on Friday. CM continuing to follow for discharge needs.   Expected Discharge Date:                  Expected Discharge Plan:     In-House Referral:     Discharge planning Services     Post Acute Care Choice:    Choice offered to:     DME Arranged:    DME Agency:     HH Arranged:    HH Agency:     Status of Service:  In process, will continue to follow  Medicare Important Message Given:    Date Medicare IM Given:    Medicare IM give by:    Date Additional Medicare IM Given:    Additional Medicare Important Message give by:     If discussed at [facility] of Stay Meetings, dates discussed:    Additional Comments:  Pollie Friar, RN 03/06/2016, 11:07 AM

## 2016-03-06 NOTE — Telephone Encounter (Signed)
Pt recently admitted by hospital neurologist. Was diagnosed with cancer (abdominal and pericardial). Has hx of CVA. Appears that pt will be d/c'd to [facility]ospice [facility].

## 2016-03-06 NOTE — Progress Notes (Signed)
Nutrition Follow-up  INTERVENTION:  Change supplements to Boost Breeze TID while on clear liquids; each supplement provides 250 kcal and 9 grams of protein Change to Boost Plus when diet is advanced  Monitor bowel function (no BM since admission per nursing notes)   NUTRITION DIAGNOSIS:   Inadequate oral intake related to lethargy/confusion, poor appetite as evidenced by per patient/family report, energy intake < 75% for > or equal to 1 month, moderate depletions of muscle mass.  Ongoing  GOAL:   Patient will meet greater than or equal to 90% of their needs  [place]  MONITOR:   PO intake, Supplement acceptance, Labs, Weight trends, I & O's, Skin  REASON FOR ASSESSMENT:   Consult Assessment of nutrition requirement/status  ASSESSMENT:   65 y.o. female with medical history significant of recurrent CVAs, hypertension, probable vascular dementia, diabetes not taking her medications for the past few months brought to the ED for evaluation of weight loss, and generalized weakness for approximately one month.   Pt's diet was changed to clear liquids last night with plans for NPO status at midnight; colonoscopy prep for tomorrow per MD note. Pt resting comfortably in bed at time of visit, states she has been drinking some liquids. [place] ice, half of tea, and half of boost were consumed from lunch tray. Pt agreeable to drinking more Boost breeze while on clear liquids.   Labs: low hemoglobin, low hematocrit  Diet Order:  Diet clear liquid Room service appropriate?: Yes; Fluid consistency:: Thin Diet NPO time specified Except for: Sips with Meds  Skin:  Reviewed, no issues  Last BM:  unknown  Height:   Ht Readings from Last 1 Encounters:  01/30/16 5\' 1"  (1.549 m)    Weight:   Wt Readings from Last 1 Encounters:  03/06/16 152 lb 4.8 oz (69.083 kg)    Ideal Body Weight:  47.7 kg  BMI:  Body mass index is 28.79 kg/(m^2).  Estimated Nutritional Needs:   Kcal:   1450-1650  Protein:  80-90 grams  Fluid:  >/= 1.8 L/day  EDUCATION NEEDS:   No education needs identified at this time  [place], LDN Inpatient Clinical Dietitian Pager: [phone] After Hours Pager: [phone]

## 2016-03-06 NOTE — Care Management Note (Signed)
Case Management Note  Patient Details  Name: [name] MRN: [id number] Date of Birth: 1951/03/19  Subjective/Objective:                    Action/Plan: CM informed that patients husband would like to take her home at discharge with home hospice services. CM met with [name] and provided him a list of Home Hospice agencies in the [place] area. He selected [facility]. CM also asked about equipment needs for the home. He stated he would need a bed and BSC for sure and would like to see what else hospice recommends. Patients husband very torn with trying to be at hospital with patient and being able to get his home ready for patient. CM encouraged him to reach out to his family (son) and the patients relatives to assist him in this time of need. CM spoke with Amy at the [facility] and informed them of the referral. She stated the liaison would call and get DME needs and further information. [name] also interested in getting information on private duty sitters and CM will provide him with the private duty sitter list. CM will continue to follow and assist in discharge needs.   Expected Discharge Date:                  Expected Discharge Plan:     In-House Referral:     Discharge planning Services     Post Acute Care Choice:    Choice offered to:     DME Arranged:    DME Agency:     HH Arranged:    HH Agency:     Status of Service:  In process, will continue to follow  Medicare Important Message Given:    Date Medicare IM Given:    Medicare IM give by:    Date Additional Medicare IM Given:    Additional Medicare Important Message give by:     If discussed at [place] of Stay Meetings, dates discussed:    Additional Comments:  Pollie Friar, RN 03/06/2016, 3:32 PM

## 2016-03-06 NOTE — Anesthesia Preprocedure Evaluation (Deleted)
Anesthesia Evaluation  Patient identified by MRN, date of birth, ID band Patient awake    Reviewed: Allergy & Precautions, H&P , Patient's Chart, lab work & pertinent test results, reviewed documented beta blocker date and time   Airway Mallampati: II  TM Distance: >3 FB Neck ROM: full    Dental no notable dental hx.    Pulmonary    Pulmonary exam normal breath sounds clear to auscultation       Cardiovascular hypertension,  Rhythm:regular Rate:Normal     Neuro/Psych    GI/Hepatic   Endo/Other  diabetes  Renal/GU      Musculoskeletal   Abdominal   Peds  Hematology   Anesthesia Other Findings   Hypertension    Diabetes mellitus   Depression    Hyperlipidemia   Bronchitis    SOB (shortness of breath)   Arthritis     Dizziness    Weakness   Heart attack 2004  Stroke [facility])  left side weaker/ uses a walker, uses most of time.    Sleep apnea study done around 05, hasnt uesd in a whiloe   Hemiparesis and alteration of sensations as late effects of stroke     Occipital stroke [facility])         Reproductive/Obstetrics                             Anesthesia Physical Anesthesia Plan  ASA: II  Anesthesia Plan: MAC   Post-op Pain Management:    Induction: Intravenous  Airway Management Planned: Mask and Natural Airway  Additional Equipment:   Intra-op Plan:   Post-operative Plan:   Informed Consent: I have reviewed the patients History and Physical, chart, labs and discussed the procedure including the risks, benefits and alternatives for the proposed anesthesia with the patient or authorized representative who has indicated his/her understanding and acceptance.   Dental Advisory Given  Plan Discussed with: CRNA and Surgeon  Anesthesia Plan Comments: (Severely sick pt for Colonoscopy and Bx; Long term life expectancy is grim Discussed sedation and potential to need  to place airway or ETT if warranted by clinical changes intra-operatively. We will start procedure as MAC.)       Anesthesia Quick Evaluation

## 2016-03-06 NOTE — Progress Notes (Signed)
Physical Therapy Treatment Patient Details Name: [name] MRN: [id number] DOB: 1950-12-03 Today's Date: 03/06/2016    History of Present Illness Patient is a 65 y.o. female with hx of HTN, DM, depression, HLD, stroke ,CKD, probable vascular dementia, SOB, arthritis, dizziness, weakness, heart attack, and renal cancer who presented with weight loss, loss of appetite, worsening generalized weakness to the extent that she is now bedbound. She also has significant decline in her functional/cognitive function over the past few months. CT scan of the abdomen -positive probable metastatic colon cancer.    PT Comments    Patient more alert and conversive today. Oriented x2 today. Continues to require increased time and multi modal cuing to perform tasks. Requires assist of 2 to get to [facility] and to chair. Able to stand for 30 sec for pericare with Min-Mod A for balance due to bil LE weakness. Demonstrates some memory deficits within session asking about drinking prep for colonoscopy tomorrow. Will follow.  Follow Up Recommendations  SNF     Equipment Recommendations  None recommended by PT    Recommendations for Other Services       Precautions / Restrictions Precautions Precautions: Fall Restrictions Weight Bearing Restrictions: No    Mobility  Bed Mobility Overal bed mobility: Needs Assistance Bed Mobility: Supine to Sit     Supine to sit: Mod assist;+2 for physical assistance;HOB elevated     General bed mobility comments: assist with trunk and LEs. heavy use of rail. Cues for technique.  Transfers Overall transfer level: Needs assistance Equipment used: Rolling walker (2 wheeled) Transfers: Sit to/from Stand Sit to Stand: Mod assist;+2 physical assistance;From elevated surface Stand pivot transfers: Mod assist;+2 physical assistance       General transfer comment: Multiple attemtps to stand with cues to place LUE on walker. SPT bed to [facility] Mod A of 2 with RW, SPT BSC  to chair. Weakness in BLEs.   Ambulation/Gait                 Stairs            Wheelchair Mobility    Modified Rankin (Stroke Patients Only)       Balance Overall balance assessment: Needs assistance Sitting-balance support: Feet supported;No upper extremity supported Sitting balance-Leahy Scale: Poor Sitting balance - Comments: variable assist needed esp with dynamic tasks, 2 major LOB requiring Max A to get to upright. Min A for static sitting balance. Postural control: Posterior lean Standing balance support: During functional activity Standing balance-Leahy Scale: Poor Standing balance comment: Reliant on BUEs for support and Min-Mod A for standing during peri care.                    Cognition Arousal/Alertness: Awake/alert Behavior During Therapy: Flat affect Overall Cognitive Status: Impaired/Different from baseline Area of Impairment: Safety/judgement;Following commands;Problem solving Orientation Level:  (Oriented to place and time today vaguely- May) Current Attention Level: Sustained (More sustained attention today)   Following Commands: Follows one step commands with increased time (repetition and increased multimodal cues) Safety/Judgement: Decreased awareness of safety;Decreased awareness of deficits   Problem Solving: Slow processing;Decreased initiation;Requires verbal cues;Requires tactile cues      Exercises General Exercises - Lower Extremity Ankle Circles/Pumps: Both;15 reps;Seated Long Arc Quad: Both;15 reps;Seated    General Comments        Pertinent Vitals/Pain Pain Assessment: No/denies pain    Home Living  Prior Function            PT Goals (current goals can now be found in the care plan section) Progress towards PT goals: Progressing toward goals    Frequency  Min 3X/week    PT Plan Current plan remains appropriate    Co-evaluation             End of Session Equipment  Utilized During Treatment: Gait belt Activity Tolerance: Patient tolerated treatment well Patient left: in chair;with call bell/phone within reach;with chair alarm set     Time: [id number] PT Time Calculation (min) (ACUTE ONLY): 18 min  Charges:  $Therapeutic Activity: 8-22 mins                    G Codes:      [place] P[place] A Bessie Boyte 03/06/2016, 11:06 AM  Wray Kearns, PT, DPT [phone]

## 2016-03-07 ENCOUNTER — Encounter (HOSPITAL_COMMUNITY): Payer: Self-pay

## 2016-03-07 ENCOUNTER — Encounter (HOSPITAL_COMMUNITY)
Admission: EM | Disposition: A | Discharge status: Hospice | Payer: Self-pay | Source: Home / Self Care | Attending: Internal Medicine

## 2016-03-07 HISTORY — PX: COLONOSCOPY: SHX5424

## 2016-03-07 LAB — GLUCOSE, CAPILLARY
GLUCOSE-CAPILLARY: 82 mg/dL (ref 65–99)
Glucose-Capillary: 310 mg/dL — ABNORMAL HIGH (ref 65–99)
Glucose-Capillary: 80 mg/dL (ref 65–99)
Glucose-Capillary: 85 mg/dL (ref 65–99)
Glucose-Capillary: 89 mg/dL (ref 65–99)

## 2016-03-07 LAB — CULTURE, BLOOD (ROUTINE X 2)
Culture: NO GROWTH
Culture: NO GROWTH

## 2016-03-07 SURGERY — COLONOSCOPY
Anesthesia: Monitor Anesthesia Care

## 2016-03-07 MED ORDER — MIDAZOLAM HCL 5 MG/5ML IJ SOLN
INTRAMUSCULAR | Status: DC | PRN
  Administered 2016-03-07: 1 mg via INTRAVENOUS
  Administered 2016-03-07: 2 mg via INTRAVENOUS
  Administered 2016-03-07 (×2): 1 mg via INTRAVENOUS

## 2016-03-07 MED ORDER — FENTANYL CITRATE (PF) 100 MCG/2ML IJ SOLN
INTRAMUSCULAR | Status: AC
  Filled 2016-03-07: qty 2

## 2016-03-07 MED ORDER — FENTANYL CITRATE (PF) 100 MCG/2ML IJ SOLN
INTRAMUSCULAR | Status: DC | PRN
  Administered 2016-03-07 (×3): 25 ug via INTRAVENOUS

## 2016-03-07 MED ORDER — BOOST PLUS PO LIQD
237.0000 mL | Freq: Two times a day (BID) | ORAL | Status: DC
  Administered 2016-03-08: 237 mL via ORAL
  Filled 2016-03-07 (×4): qty 237

## 2016-03-07 MED ORDER — FENTANYL CITRATE (PF) 100 MCG/2ML IJ SOLN: 25.0000 ug | INTRAMUSCULAR | Status: DC | PRN

## 2016-03-07 MED ORDER — POLYETHYLENE GLYCOL 3350 17 G PO PACK
17.0000 g | PACK | Freq: Three times a day (TID) | ORAL | Status: DC
  Administered 2016-03-07 – 2016-03-08 (×4): 17 g via ORAL
  Filled 2016-03-07 (×4): qty 1

## 2016-03-07 MED ORDER — DEXTROSE 50 % IV SOLN
INTRAVENOUS | Status: DC
Start: 2016-03-07 — End: 2016-03-07
  Filled 2016-03-07: qty 50

## 2016-03-07 MED ORDER — MIDAZOLAM HCL 5 MG/ML IJ SOLN
INTRAMUSCULAR | Status: AC
  Filled 2016-03-07: qty 2

## 2016-03-07 NOTE — Progress Notes (Signed)
Patient had a nearly obstructing mass in the proximal descending: that was biopsy. Were unable to pass beyond this. She did not cooperate with the prep so we were forced to use tapwater enemas will defer further treatment recommendations to oncology. Please call us up we can be of any further help.

## 2016-03-07 NOTE — Telephone Encounter (Signed)
Returned call to Amy w/ Hospice and let her know that Dr. Jannifer Franklin is agreeable to be attending physician. Hospice will do symptom management for pt and contact our office with any additional questions/concerns.

## 2016-03-07 NOTE — Progress Notes (Signed)
Pt continues to refuse her intake of colonoscopy solution prep with encouragement and states "Im not gonna be able to drink all of that." Will continue to monitor.

## 2016-03-07 NOTE — Op Note (Signed)
[facility] Patient Name: [name] Procedure Date : 03/07/2016 MRN: [id number] Attending MD: Nancy Fetter , MD Date of Birth: [birth year] CSN: [id number] Age: 65 Admit Type: Inpatient Procedure:                Colonoscopy Indications:              Abnormal CT of the GI tract, showing metastatic                            cancer to the liver and a mass in the vicinity of                            the proximal descending colon. Providers:                Joyice Faster. Oletta Lamas, MD, Lurline Del, RN, Despina Pole,                            Technician Referring MD:              Medicines:                Fentanyl 75 micrograms IV, Midazolam 5 mg IV Complications:            No immediate complications. Estimated Blood Loss:     Estimated blood loss: none. Procedure:                Pre-Anesthesia Assessment:                           - Prior to the procedure, a History and Physical                            was performed, and patient medications and                            allergies were reviewed. The patient's tolerance of                            previous anesthesia was also reviewed. The risks                            and benefits of the procedure and the sedation                            options and risks were discussed with the patient.                            All questions were answered, and informed consent                            was obtained. Prior Anticoagulants: The patient has                            taken no previous anticoagulant or antiplatelet  agents. ASA Grade Assessment: III - A patient with                            severe systemic disease. After reviewing the risks                            and benefits, the patient was deemed in                            satisfactory condition to undergo the procedure.                           After obtaining informed consent, the colonoscope   was passed under direct vision. Throughout the                            procedure, the patient's blood pressure, pulse, and                            oxygen saturations were monitored continuously. The                            [id number] scope was introduced through                            the anus with the intention of advancing to the                            cecum. The scope was advanced to the descending                            colon before the procedure was aborted. Medications                            were given. The rectum was photographed. The                            colonoscopy was somewhat difficult. The patient                            tolerated the procedure fairly well. The quality of                            the bowel preparation was fair. Scope In: 9:17:28 AM Scope Out: 9:29:43 AM Total Procedure Duration: 0 hours 12 minutes 15 seconds  Findings:      The perianal and digital rectal examinations were normal.      A fungating completely obstructing large mass was found in the proximal       descending colon. The mass was circumferential. Oozing was present. This       was biopsied with a cold forceps for histology. the patient had a good       portion of the prep and there was a lot of stool there as well. With  stool and the large obstructing: mass I do not feel was prudent to try       to pass.      The exam was otherwise without abnormality. Impression:               - Preparation of the colon was fair.                           - Likely malignant completely obstructing tumor in                            the proximal descending colon. Biopsied.                           - The examination was otherwise normal. Moderate Sedation:      Moderate (conscious) sedation was administered by the endoscopy nurse       and supervised by the endoscopist. The following parameters were       monitored: oxygen saturation, heart rate, blood pressure,  respiratory       rate, EKG, adequacy of pulmonary ventilation, and response to care. Recommendation:           - Return patient to hospital ward for ongoing care.                           - Resume previous diet.                           - Continue present medications.                           - Await pathology results.                           - No repeat colonoscopy. Procedure Code(s):        --- Professional ---                           [phone], 65, Colonoscopy, flexible; with biopsy,                            single or multiple Diagnosis Code(s):        --- Professional ---                           D49.0, Neoplasm of unspecified behavior of                            digestive system                           R93.3, Abnormal findings on diagnostic imaging of                            other parts of digestive tract CPT copyright 2016 [employer]. All rights reserved. The codes documented in this report are preliminary and upon coder review may  be revised to meet current compliance requirements. Laurence Spates, MD Jeneen Rinks  Pearletha Forge, MD 03/07/2016 9:45:30 AM This report has been signed electronically. Number of Addenda: 0

## 2016-03-07 NOTE — Progress Notes (Signed)
CM spoke with patients husband and he is preparing the house for patients arrival. He states he has rented a truck to Scientist, physiological. CM will continue to follow for discharge needs.

## 2016-03-07 NOTE — Consult Note (Signed)
[facility] Surgery Consult Note  [name] 1951/06/03  [id number].    Requesting MD: Dr. Sloan Leiter Chief Complaint/Reason for Consult: Metastatic colon cancer  HPI:  The patient is demented and no family is at bedside to assist in history, all history obtained from the medical chart, nursing staff, and team physicians.  65 y/o AA female PMH of recurrent CVAs, HTN, dementia, DM, noncompliant with her medications for the past few months who was brought to the ED forr evaluation of weight loss, loss of appetite, worsening generalized weakness to the extent that she is now bed bound. She has had significant decline in her functional and cognitive status over the past few months. CT scan of the abdomen positive metastatic colon cancer to the mediastinum, LAD, paraesophageal mass, pulmonary nodules, pancreatic head/duodenal mass and splenic flexure mass. Underwent colonoscopy 5/12-which confirmed left splenic flexure mass that was nearly obstructing by Dr. Oletta Lamas, awaiting bx results.  Hospice and palliative care has met wit the patients and the husband is leaning towards hospice, but we were consulted regarding possible diverting colostomy.  H/o nephrectomy, splenectomy, gastric banding, hysterectomy, and cholecystectomy.  The patient denies any abdominal pain or N/V, but is a poor historian. She reportedly is having BM's and was able to be prepped for the colonoscopy.     ROS: All systems reviewed and otherwise negative except for as above  Family History  Problem Relation Age of Onset  . Diabetes Father   . Alzheimer's disease Father   . Kidney cancer Father   . Hypertension Father   . Kidney disease Father     kidney removal  . Hypertension Mother   . Arthritis Mother   . Stroke Brother   . Diabetes Brother   . Hypertension Brother   . Diabetes Sister   . Thyroid disease Sister   . Hyperlipidemia Sister   . Other Sister     thyroidectomy    Past Medical History   Diagnosis Date  . Hypertension   . Diabetes mellitus   . Depression   . Hyperlipidemia   . Bronchitis   . SOB (shortness of breath)   . Arthritis   . Dizziness   . Weakness   . Sore throat   . Sinus problem   . Family history of breast cancer   . Heart attack [facility]) 2004    uses to see Dr Vidal Schwalbe- doesnt see anyone since  . Stroke [facility])     left side weaker/ uses a walker, uses most of time.  . Chronic kidney disease     possible stones - confirm with patient  . Renal cancer ([place])   . Sleep apnea     study done around 05, hasnt uesd in a whiloe  . Hemiparesis and alteration of sensations as late effects of stroke ([place]) 11/13/2015  . Occipital stroke ([facility] [place]) 01/30/2016    Past Surgical History  Procedure Laterality Date  . Spleen removal  2001  . Laparoscopic gastric banding  05/09/2008  . Abdominal hysterectomy  2002  . Cholecystectomy  1999  . Nephrectomy  2001    due to cancer  . Carpal tunnel release    .  fracture achilles Left 12/29/2012    achilles calcaneal fracture      Dr Sharol Given  . Achilles tendon surgery Left 12/29/2012    Procedure: ACHILLES TENDON REPAIR;  Surgeon: Newt Minion, MD;  Location: [place];  Service: Orthopedics;  Laterality: Left;  Resection Left Calcaneal fracture and repair  Achilles    Social History:  reports that she has never smoked. She has never used smokeless tobacco. She reports that she does not drink alcohol or use illicit drugs.  Allergies:  Allergies  Allergen Reactions  . Iohexol      Code: HIVES, Desc: Pt states in 2005 w/ a heart cath, she broke out in large hives and a rash.  She was given $RemoveB'50mg'QCWTuzoO$  benadryl, po.  She tolerated the procedure well w/o complication.  Thanks., Onset Date: [id number]     Medications Prior to Admission  Medication Sig Dispense Refill  . sertraline (ZOLOFT) 50 MG tablet Take 50 mg by mouth daily.  1  . Vitamin D, Ergocalciferol, (DRISDOL) 50000 units CAPS capsule Reported on 03/02/2016  2    Blood pressure  137/80, pulse 89, temperature 97.9 F (36.6 C), temperature source Axillary, resp. rate 20, weight 158 lb 1.6 oz (71.714 kg), last menstrual period 12/28/2012, SpO2 99 %. Physical Exam: General: thin malnourished, alert, disoriented, WD AA female who is laying in bed in NAD HEENT: head is normocephalic, atraumatic.  Sclera are noninjected.  PERRL.  Ears and nose without any masses or lesions.  Mouth is pink and moist Heart: regular, rate, and rhythm.  Murmur heard.  No gallops, or rubs noted.  Palpable pedal pulses bilaterally Lungs: CTAB, no wheezes, rhonchi, or rales noted.  Respiratory effort nonlabored Abd: soft, NT/ND, +BS, no masses, hernias, or organomegaly MS: all 4 extremities are symmetrical with no cyanosis, clubbing, or edema. Skin: warm and dry with no masses, lesions, or rashes Psych: Alert and awake, disoriented to place, time, current events   Results for orders placed or performed during the hospital encounter of 03/02/16 (from the past 48 hour(s))  Glucose, capillary     Status: None   Collection Time: 03/05/16  4:52 PM  Result Value Ref Range   Glucose-Capillary 92 65 - 99 mg/dL  Glucose, capillary     Status: Abnormal   Collection Time: 03/05/16 10:22 PM  Result Value Ref Range   Glucose-Capillary 113 (H) 65 - 99 mg/dL  Glucose, capillary     Status: Abnormal   Collection Time: 03/06/16  6:39 AM  Result Value Ref Range   Glucose-Capillary 116 (H) 65 - 99 mg/dL   Comment 1 Notify RN    Comment 2 Document in Chart   CBC     Status: Abnormal   Collection Time: 03/06/16  9:26 AM  Result Value Ref Range   WBC 14.4 (H) 4.0 - 10.5 K/uL   RBC 3.30 (L) 3.87 - 5.11 MIL/uL   Hemoglobin 9.5 (L) 12.0 - 15.0 g/dL   HCT 28.8 (L) 36.0 - 46.0 %   MCV 87.3 78.0 - 100.0 fL   MCH 28.8 26.0 - 34.0 pg   MCHC 33.0 30.0 - 36.0 g/dL   RDW 17.7 (H) 11.5 - 15.5 %   Platelets 406 (H) 150 - 400 K/uL  Glucose, capillary     Status: Abnormal   Collection Time: 03/06/16 11:43 AM  Result  Value Ref Range   Glucose-Capillary 180 (H) 65 - 99 mg/dL  Glucose, capillary     Status: Abnormal   Collection Time: 03/06/16  4:14 PM  Result Value Ref Range   Glucose-Capillary 122 (H) 65 - 99 mg/dL  Glucose, capillary     Status: None   Collection Time: 03/06/16 10:19 PM  Result Value Ref Range   Glucose-Capillary 79 65 - 99 mg/dL   Comment 1 Notify RN  Comment 2 Document in Chart   Glucose, capillary     Status: None   Collection Time: 03/07/16 12:39 AM  Result Value Ref Range   Glucose-Capillary 89 65 - 99 mg/dL   Comment 1 Notify RN    Comment 2 Document in Chart   Glucose, capillary     Status: None   Collection Time: 03/07/16  6:56 AM  Result Value Ref Range   Glucose-Capillary 85 65 - 99 mg/dL   Comment 1 Notify RN    Comment 2 Document in Chart   Glucose, capillary     Status: None   Collection Time: 03/07/16 11:23 AM  Result Value Ref Range   Glucose-Capillary 82 65 - 99 mg/dL   No results found.    Assessment/Plan Metastatic colon cancer -Pending biopsy from [facility] today -Patient is very malnourished, weak, h/o strokes, bed bound, h/o 5 surgeries does seem to complicate her surgery and recovery. -2 options are palliative care and hospice route without aggressive treatment vs palliative diverting colostomy.  They could attempt to do it laparoscopically.   -Dr. Rosendo Gros will talk with the husband to explain the risks/benefits.   Nat Christen, [facility] 03/07/2016, 12:27 PM Pager: [phone] (7am - 4:30pm M-F; 7am - 11:30am Sa/Su)

## 2016-03-07 NOTE — Progress Notes (Signed)
PROGRESS NOTE        PATIENT DETAILS Name: [name] Age: 65 y.o. Sex: female Date of Birth: 1951-01-24 Admit Date: 03/02/2016 Admitting Physician Evalee Mutton Kristeen Mans, MD [place]:[id number] Bunnie Domino, MD Outpatient Specialists:Dr Jannifer Franklin  Brief Narrative: Patient is a 65 y.o. female medical history significant of recurrent CVAs, hypertension, probable vascular dementia, diabetes, noncompliant with her medications for the past few months-brought to the ED forr evaluation of weight loss, loss of appetite, worsening generalized weakness to the extent that she is now bedbound. She has had significant decline in her functional and cognitive status over the past few months. CT scan of the abdomen positive probable metastatic colon cancer. Underwent colonoscopy 5/12-which confirmed left splenic flexure mass that was nearly obstructing, awaiting bx results.  Subjective: Sleeping comfortably  Assessment/Plan: Active Problems: Failure to thrive syndrome: Likely secondary to metastatic colon cancer. Given the rapid decline in cognitive and functional status, consulted palliative care consulted for goals of care before proceeding with any further workup at this point. Subsequently oncology consultation was obtained to see if oncology felt that patient would be a candidate for palliative chemotherapy. After further discussion, family wanted to pursue biopsy, GI consultation was obtained, and patient underwent a colonoscopy on 5/12-which confirmed a splenic flexure mass.   Probable Metastatic Colon Cancer:Colonoscopy on 5/12- confirmed a splenic flexure mass-which is near obstructing, per GI MD mass near obstructing-not a great candidate for stenting as on mass near splenic flexure, spoke with family-they would like a gen surgery opinion for a diverting colostomy.Have consulted Gen Surgery-but at this time-no obvious obstructive symptoms. If felt not to be a candidate for diverting  colostomy (prealbumin 2.8)-home with hospice on 5/13    Leukocytosis: UA/chest x-ray/CT abd and chest negative for any infective foci. Blood cultures negative so far.No foci evident on exam. Leukocytosis down trending-continue to monitor off antibiotics.   AKI (with solitary kidney):Improved-but high risk of recurrence given ongoing failure to thrive symp and poor oral intake.  Likely secondary to prerenal azotemia in a setting of poor oral intake and dehydration.Avoid nephrotoxic agents.  History of recurrent CVA: Unfortunately noncompliant with antiplatelets including Plavix for the past few months. Continue Plavix for now.  History of left nephrectomy: Remote history-spouse not aware of exactly why nephrectomy done-but he suspects malignancy was the cause.  History of type 2 diabetes: Noncompliant with oral hypoglycemics-off meds for the past few months-as very poor oral intake-CBGs currently stable with SSI. Avoid restarting oral hypoglycemics at this point.  Hypertension: Noncompliant with antihypertensives-was not taking any medications for the past few months-blood pressure slightly elevated-continue to monitor off antihypertensives for now. Continue as needed hydralazine.  Suspected dementia/hypersomnolence: Follows with neurology in the outpatient setting-suspect excessive sleepiness-likely part of failure to thrive syndrome and natural decline of this patient.  Severe protein calorie malnutrition: Prealbumin of 2.8!!Nutrition consult appreciated-continue supplementes  Palliative care: Unfortunate 65 year old with history of recurrent CVA-she has had significant decline in her functional and cognitive status in the past few months. She has stopped taking all of her medications. She has had significant loss of appetite and weight loss over the past few months-her generalized weakness has worsened to the extent that she has been bedbound for the past 1 month. Further evaluation revealed  probable metastatic colon cancer-confirmed on colonoscopy. Mass was nearly obstructing. Poor candidate for aggressive care-after d/w family-DNR  entered. Plans are to discharge to home with hospice, however family wants to treat the treatable and consider palliative diverting colostomy. Awaiting gen surgery opinion, if felt not to be a candidate for diverting colostomy-home with hospice tomorrow.   DVT Prophylaxis: Prophylactic Lovenox   Code Status: DNR  Family Communication: Spouse over the phone  Disposition Plan: [place] on discharge.   Antimicrobial agents: None  Procedures: None  CONSULTS:  Palliative   Oncology  GI   Gen Surgery  Time spent: 25 minutes-Greater than 50% of this time was spent in counseling, explanation of diagnosis, planning of further management, and coordination of care.  MEDICATIONS: Anti-infectives    None      Scheduled Meds: . clopidogrel  75 mg Oral Daily  . feeding supplement  1 Container Oral TID BM  . insulin aspart  0-9 Units Subcutaneous TID WC  . multivitamin with minerals  1 tablet Oral Daily  . polyethylene glycol  17 g Oral TID  . sertraline  50 mg Oral Daily  . sodium chloride flush  3 mL Intravenous Q12H   Continuous Infusions: . sodium chloride 500 mL (03/07/16 0939)   PRN Meds:.acetaminophen **OR** acetaminophen, albuterol, hydrALAZINE, ondansetron **OR** ondansetron (ZOFRAN) IV, polyethylene glycol   PHYSICAL EXAM: Vital signs: Filed Vitals:   03/07/16 0935 03/07/16 0940 03/07/16 0950 03/07/16 1010  BP: 132/76 128/79 130/77 137/80  Pulse: 89 89 88 89  Temp:  96.9 F (36.1 C)  97.9 F (36.6 C)  TempSrc:  Axillary  Axillary  Resp: 19 21 21 20   Weight:      SpO2: 100% 100% 99% 99%   Filed Weights   03/05/16 0500 03/06/16 0500 03/07/16 0419  Weight: 67.314 kg (148 lb 6.4 oz) 69.083 kg (152 lb 4.8 oz) 71.714 kg (158 lb 1.6 oz)   Body mass index is 29.89 kg/(m^2).   Gen Exam: Awake and  And pleasantly confused. Neck: Supple, No JVD. Chest: B/L Clear.   CVS: S1 S2 Regular, no murmurs.  Abdomen: soft, BS +, non tender, non distended.  Extremities: no edema, lower extremities warm to touch. Neurologic: Non Focal-but with generalized weakness.   LABORATORY DATA: CBC:  Recent Labs Lab 03/02/16 1127 03/02/16 1144 03/03/16 0431 03/04/16 0815 03/05/16 0753 03/06/16 0926  WBC 23.1*  --  25.8* 27.5* 21.7* 14.4*  NEUTROABS 18.5*  --   --   --   --   --   HGB 11.0* 13.6 9.8* 10.0* 10.1* 9.5*  HCT 33.7* 40.0 30.3* 29.7* 30.5* 28.8*  MCV 86.9  --  88.3 87.6 87.1 87.3  PLT 365  --  354 379 422* 406*    Basic Metabolic Panel:  Recent Labs Lab 03/02/16 1127 03/02/16 1144 03/03/16 0431 03/04/16 0815  NA 134* 135 133* 135  K 4.2 4.2 4.3 4.0  CL 97* 100* 100* 105  CO2 21*  --  21* 21*  GLUCOSE 161* 161* 158* 123*  BUN 22* 25* 25* 26*  CREATININE 1.39* 1.30* 1.21* 1.03*  CALCIUM 9.9  --  9.0 9.0    GFR: Estimated Creatinine Clearance: 50.7 mL/min (by C-G formula based on Cr of 1.03).  Liver Function Tests:  Recent Labs Lab 03/02/16 1127 03/03/16 0431  AST 98* 68*  ALT 17 18  ALKPHOS 189* 166*  BILITOT 1.7* 1.6*  PROT 7.8 7.2  ALBUMIN 2.8* 2.4*   No results for input(s): LIPASE, AMYLASE in the last 168 hours. No results for input(s): AMMONIA in the last 168  hours.  Coagulation Profile:  Recent Labs Lab 03/02/16 1127  INR 1.20    Cardiac Enzymes: No results for input(s): CKTOTAL, CKMB, CKMBINDEX, TROPONINI in the last 168 hours.  BNP (last 3 results) No results for input(s): PROBNP in the last 8760 hours.  HbA1C: No results for input(s): HGBA1C in the last 72 hours.  CBG:  Recent Labs Lab 03/06/16 1614 03/06/16 2219 03/07/16 0039 03/07/16 0656 03/07/16 1123  GLUCAP 122* 79 89 85 82    Lipid Profile: No results for input(s): CHOL, HDL, LDLCALC, TRIG, CHOLHDL, LDLDIRECT in the last 72 hours.  Thyroid Function Tests: No results  for input(s): TSH, T4TOTAL, FREET4, T3FREE, THYROIDAB in the last 72 hours.  Anemia Panel: No results for input(s): VITAMINB12, FOLATE, FERRITIN, TIBC, IRON, RETICCTPCT in the last 72 hours.  Urine analysis:    Component Value Date/Time   COLORURINE AMBER* 03/02/2016 1310   APPEARANCEUR CLOUDY* 03/02/2016 1310   LABSPEC 1.020 03/02/2016 1310   PHURINE 5.5 03/02/2016 1310   GLUCOSEU NEGATIVE 03/02/2016 1310   HGBUR NEGATIVE 03/02/2016 1310   BILIRUBINUR SMALL* 03/02/2016 1310   KETONESUR 15* 03/02/2016 1310   PROTEINUR 30* 03/02/2016 1310   UROBILINOGEN 0.2 01/09/2013 1151   NITRITE NEGATIVE 03/02/2016 1310   LEUKOCYTESUR NEGATIVE 03/02/2016 1310    Sepsis Labs: Lactic Acid, Venous No results found for: LATICACIDVEN  MICROBIOLOGY: Recent Results (from the past 240 hour(s))  Culture, blood (Routine X 2) w Reflex to ID Panel     Status: None (Preliminary result)   Collection Time: 03/02/16  5:45 PM  Result Value Ref Range Status   Specimen Description RIGHT ANTECUBITAL  Final   Special Requests BOTTLES DRAWN AEROBIC ONLY 5CC  Final   Culture NO GROWTH 4 DAYS  Final   Report Status PENDING  Incomplete  Culture, blood (Routine X 2) w Reflex to ID Panel     Status: None (Preliminary result)   Collection Time: 03/02/16  5:51 PM  Result Value Ref Range Status   Specimen Description BLOOD RIGHT HAND  Final   Special Requests IN PEDIATRIC BOTTLE 3CC  Final   Culture NO GROWTH 4 DAYS  Final   Report Status PENDING  Incomplete    RADIOLOGY STUDIES/RESULTS: Ct Abdomen Pelvis Wo Contrast  03/02/2016  CLINICAL DATA:  65 year old female with a history of abdominal pain EXAM: CT ABDOMEN AND PELVIS WITHOUT CONTRAST TECHNIQUE: Multidetector CT imaging of the abdomen and pelvis was performed following the standard protocol without IV contrast. COMPARISON:  Plain film 05/10/2008, CT 11/23/2006 FINDINGS: Lower chest: Unremarkable appearance of the soft tissues of the chest wall. Heart size  within normal limits.  No pericardial fluid/thickening. Calcifications of the coronary arteries. Pathologic adenopathy of the pericardial lymph nodes just above the diaphragm, with nodal mass measuring 3.8 cm by 1.6 cm. Diaphragmatic lymph node on the left measures 2.2 cm. Paraesophageal lymph node measures 3.2 cm by 2.2 cm. No confluent airspace disease. Several small nodules are identified within lingula, right middle lobe, right lower lobe, and left lower lobe in the sulcus. Largest measures 5 mm in the lingula. Abdomen/pelvis: Multiple nodules/masses within the liver parenchyma, new from the comparison CT of 2008. Ill-defined mineralization/calcification within the infiltrative lesions of the left lobe. Surgical changes of splenectomy. Surgical changes of lap banding. Soft tissue mass at the descending duodenum/pancreatic head, with poor definition of the small bowel and the pancreas. Soft tissue measures 4.9 cm x 4.4 cm. Ill-defined circumferential soft tissue thickening of the splenic flexure with ill-defined  mineralization/calcifications extending towards the greater curvature of the stomach. Greatest size of this soft tissue mass measures 4.7 cm x 4.4 cm. There is abrupt transition of both the transverse colon and the descending colon after the soft tissue mass. Multiple soft tissue nodule/peritoneal lymph nodes. Partially calcified/mineralized soft tissue mass in the right upper quadrant, adjacent to the greater curvature. Surgical changes of left nephrectomy. Unremarkable left adrenal gland. Unremarkable right adrenal gland. No evidence of right-sided hydronephrosis. Nonobstructive stone measures 5 mm -6 mm at the superior collecting system. Unremarkable course of the right ureter. Unremarkable appearance of the urinary bladder. Diffuse calcifications of the abdominal vasculature.  No aneurysm. Hysterectomy. No displaced fracture. Multilevel degenerative changes of the visualized thoracolumbar spine. Facet  changes bilaterally. Degenerative changes of the bilateral hips. IMPRESSION: Evidence of metastatic disease involving the liver, multiple nodal stations (including pericardial, paraesophageal, left diaphragmatic, portacaval), and peritoneum of the left upper quadrant. Mineralized/calcified mass inseparable from the left hepatic flexure is suspicious for the primary malignancy. Recommend correlation with colonoscopy. If further imaging is warranted, consider contrast-enhanced CT abdomen. Multiple small nodules of the inferior lungs, suspicious for metastases. Contrast-enhanced chest CT recommended for further evaluation. These results were called by telephone at the time of interpretation on 03/02/2016 at 5:41 pm to Dr. Sloan Leiter who verbally acknowledged these results. Surgical changes of prior left nephrectomy. Nonobstructive kidney stone of the right superior collecting system. Atherosclerosis with evidence of coronary artery disease. Signed, Dulcy Fanny. Earleen Newport, DO [facility] Electronically Signed   By: Corrie Mckusick D.O.   On: 03/02/2016 17:49   Dg Chest 2 View  03/02/2016  CLINICAL DATA:  Weakness for 2 days. EXAM: CHEST  2 VIEW COMPARISON:  07/20/2012 radiograph FINDINGS: Cardiomegaly, coronary stent and mild elevation of the right hemidiaphragm again noted. There is no evidence of focal airspace disease, pulmonary edema, suspicious pulmonary nodule/mass, pleural effusion, or pneumothorax. No acute bony abnormalities are identified. Remote left rib fractures are identified. IMPRESSION: Cardiomegaly without evidence of acute cardiopulmonary disease. Electronically Signed   By: Margarette Canada M.D.   On: 03/02/2016 18:12   Ct Head Wo Contrast  03/02/2016  CLINICAL DATA:  Increasing weakness, anorexia, hypertension, diabetes mellitus, history MI and stroke EXAM: CT HEAD WITHOUT CONTRAST TECHNIQUE: Contiguous axial images were obtained from the base of the skull  through the vertex without intravenous contrast. COMPARISON:  07/19/2012 FINDINGS: Generalized atrophy. Normal ventricular morphology. No midline shift or mass effect. Small vessel chronic ischemic changes of deep cerebral white matter. 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 Old lacunar infarcts BILATERAL thalamus. No intracranial hemorrhage, mass lesion, or acute infarction. Visualized paranasal sinuses and mastoid air cells clear. Bones unremarkable. IMPRESSION: Atrophy with small vessel chronic ischemic changes of deep cerebral white matter. 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 No acute intracranial abnormalities. Electronically Signed   By: Lavonia Dana M.D.   On: 03/02/2016 12:16   Ct Chest Wo Contrast  03/04/2016  CLINICAL DATA:  Evidence of metastatic colon carcinoma by prior CT with small bilateral lung nodules that the visualized lung bases. IV contrast was not administered due to history of contrast allergy and prior nephrectomy. EXAM: CT CHEST WITHOUT CONTRAST TECHNIQUE: Multidetector CT imaging of the chest was performed following the standard protocol without IV contrast. COMPARISON:  CT of the abdomen and pelvis on 03/02/2016 FINDINGS: Mediastinum/Nodes: Metastatic disease in the chest is present with soft tissue masses in the mediastinum. There is a roughly 1.7 x 2.0  cm mass and an immediate adjacent 1.7 cm mass located in the right infrahilar region just lateral to the right atrium. Elongated soft tissue mass in the anterior mediastinum abutting the pericardium axial may be 2 adjacent masses and measures in conglomerate dimensions approximately 1.6 x 4.3 cm. Cardiophrenic angle mass seen on the abdominal CT measures 1.4 x 2.3 cm. Paraesophageal mass in the posterior mediastinum just above the diaphragm measures approximately 2.6 x 4.0 cm. Proximal mildly prominent right hilar lymph node tissue measuring roughly 1.2 cm in short axis. Coronary atherosclerosis present with calcified  plaque in a 3 vessel distribution. The heart size is normal. No pericardial fluid present. The thoracic aorta is normal in caliber. Lungs/Pleura: The only discrete lung nodule is the inferior lingular nodule seen by abdominal CT measuring approximately 3 x 5 mm. This nodule is noncalcified. No other discrete pulmonary nodules are identified. There is mild atelectasis at both lung bases. Minimal trace left pleural effusion. No edema or airspace consolidation. No airway obstruction. Upper abdomen: Visualized upper abdomen shows a laparoscopic band and calcified lesions in the visible upper liver. Musculoskeletal: No bone metastases or fractures are identified in the chest. IMPRESSION: 1. Metastatic soft tissue masses in the mediastinum most likely representing metastatic lymphadenopathy. The paraesophageal mass may represent direct extension of tumor through the hiatus. 2. The only discrete pulmonary nodule is a well-circumscribed 3 x 5 mm nodule in the inferior lingula which may be benign. 3. Coronary atherosclerosis with calcified plaque in a 3 vessel distribution. Electronically Signed   By: Aletta Edouard M.D.   On: 03/04/2016 19:08     LOS: 5 days   Oren Binet, MD  [facility] Pager:[phone]  If 7PM-7AM, please contact night-coverage [email or web address] Password [facility] 03/07/2016, 11:27 AM

## 2016-03-08 LAB — GLUCOSE, CAPILLARY
GLUCOSE-CAPILLARY: 221 mg/dL — AB (ref 65–99)
Glucose-Capillary: 94 mg/dL (ref 65–99)
Glucose-Capillary: 161 mg/dL — ABNORMAL HIGH (ref 65–99)

## 2016-03-08 MED ORDER — BOOST PLUS PO LIQD: 237.0000 mL | Freq: Two times a day (BID) | ORAL | Status: DC

## 2016-03-08 MED ORDER — CLOPIDOGREL BISULFATE 75 MG PO TABS: 75.0000 mg | ORAL_TABLET | Freq: Every day | ORAL | Status: DC

## 2016-03-08 MED ORDER — MORPHINE SULFATE (CONCENTRATE) 10 MG /0.5 ML PO SOLN: 5.0000 mg | ORAL | Status: AC | PRN

## 2016-03-08 MED ORDER — ONDANSETRON HCL 4 MG PO TABS: 4.0000 mg | ORAL_TABLET | Freq: Four times a day (QID) | ORAL | Status: AC | PRN

## 2016-03-08 MED ORDER — BOOST PLUS PO LIQD: 237.0000 mL | Freq: Two times a day (BID) | ORAL | Status: AC

## 2016-03-08 MED ORDER — POLYETHYLENE GLYCOL 3350 17 G PO PACK: 17.0000 g | PACK | Freq: Two times a day (BID) | ORAL | Status: AC

## 2016-03-08 MED ORDER — SERTRALINE HCL 50 MG PO TABS: 50.0000 mg | ORAL_TABLET | Freq: Every day | ORAL | Status: AC

## 2016-03-08 MED ORDER — POLYETHYLENE GLYCOL 3350 17 G PO PACK: 17.0000 g | PACK | Freq: Two times a day (BID) | ORAL | Status: DC

## 2016-03-08 NOTE — Progress Notes (Signed)
[facility] RN Note Spoke with Mr. Heinbaugh.  He states he will be ready for patient to be discharged around 6pm today.    Please arrange for discharge via  [facility] for 6p.m today.  Please call [phone] when patient has been discharged.     [facility] will follow up with patient upon discharge.   Thank you!    Mickie Kay, [facility]  [phone]

## 2016-03-08 NOTE — Progress Notes (Signed)
Patient discharged home via [facility] in stable  Condition.

## 2016-03-08 NOTE — Discharge Summary (Addendum)
PATIENT DETAILS Name: [name] Age: 65 y.o. Sex: female Date of Birth: 02-21-1951 MRN: [id number]. Admitting Physician: Jonetta Osgood, MD [id number] Bunnie Domino, MD  Admit Date: 03/02/2016 Discharge date: 03/08/2016  Recommendations for Outpatient Follow-up:  1. Being discharged with home hospice  2. Colonoscopy biopsy results are pending, please follow 3. May need one-time follow-up with oncology 4. Slowly optimize comfort measures   PRIMARY DISCHARGE DIAGNOSIS:  Active Problems:   Malignant Neoplasm of the splenic flexure   Hypersomnia with sleep apnea   NEPHRECTOMY, HX OF   HTN (hypertension)   Diabetes mellitus (HCC)   Failure to thrive (0-17)   Generalized weakness   DNR (do not resuscitate) discussion   Palliative care encounter   Abdominal mass   Leukocytosis   Colon cancer metastasized to liver [facility])   DNR (do not resuscitate)      PAST MEDICAL HISTORY: Past Medical History  Diagnosis Date  . Hypertension   . Diabetes mellitus   . Depression   . Hyperlipidemia   . Bronchitis   . SOB (shortness of breath)   . Arthritis   . Dizziness   . Weakness   . Sore throat   . Sinus problem   . Family history of breast cancer   . Heart attack [facility]) 2004    uses to see Dr Vidal Schwalbe- doesnt see anyone since  . Stroke [facility])     left side weaker/ uses a walker, uses most of time.  . Chronic kidney disease     possible stones - confirm with patient  . Renal cancer ([place])   . Sleep apnea     study done around 05, hasnt uesd in a whiloe  . Hemiparesis and alteration of sensations as late effects of stroke ([place]) 11/13/2015  . Occipital stroke ([place]) 01/30/2016    DISCHARGE MEDICATIONS: Current Discharge Medication List    START taking these medications   Details  lactose free nutrition (BOOST PLUS) LIQD Take 237 mLs by mouth 2 (two) times daily between meals. Qty: 60 Can, Refills: 0    Morphine Sulfate (MORPHINE CONCENTRATE) 10 mg / 0.5 ml concentrated  solution Take 0.25 mLs (5 mg total) by mouth every 2 (two) hours as needed for severe pain, anxiety or shortness of breath. Qty: 30 mL, Refills: 0    ondansetron (ZOFRAN) 4 MG tablet Take 1 tablet (4 mg total) by mouth every 6 (six) hours as needed for nausea. Qty: 20 tablet, Refills: 0    polyethylene glycol (MIRALAX / GLYCOLAX) packet Take 17 g by mouth 2 (two) times daily. Qty: 28 each, Refills: 0      CONTINUE these medications which have CHANGED   Details  sertraline (ZOLOFT) 50 MG tablet Take 1 tablet (50 mg total) by mouth daily. Qty: 30 tablet, Refills: 0      STOP taking these medications     Vitamin D, Ergocalciferol, (DRISDOL) 50000 units CAPS capsule         ALLERGIES:   Allergies  Allergen Reactions  . Iohexol      Code: HIVES, Desc: Pt states in 2005 w/ a heart cath, she broke out in large hives and a rash.  She was given 50mg  benadryl, po.  She tolerated the procedure well w/o complication.  Thanks., Onset Date: [id number]     BRIEF HPI:  See H&P, Labs, Consult and Test reports for all details in brief, Patient is a 65 y.o. female medical history significant of recurrent CVAs, hypertension, probable  vascular dementia, diabetes, noncompliant with her medications for the past few months-brought to the ED forr evaluation of weight loss, loss of appetite, worsening generalized weakness to the extent that she is now bedbound. She has had significant decline in her functional and cognitive status over the past few months. CT scan of the abdomen positive probable metastatic colon cancer.   CONSULTATIONS:   GI, hematology/oncology, general surgery and Palliative care  PERTINENT RADIOLOGIC STUDIES: Ct Abdomen Pelvis Wo Contrast  03/02/2016  CLINICAL DATA:  65 year old female with a history of abdominal pain EXAM: CT ABDOMEN AND PELVIS WITHOUT CONTRAST TECHNIQUE: Multidetector CT imaging of the abdomen and pelvis was performed following the standard protocol without IV  contrast. COMPARISON:  Plain film 05/10/2008, CT 11/23/2006 FINDINGS: Lower chest: Unremarkable appearance of the soft tissues of the chest wall. Heart size within normal limits.  No pericardial fluid/thickening. Calcifications of the coronary arteries. Pathologic adenopathy of the pericardial lymph nodes just above the diaphragm, with nodal mass measuring 3.8 cm by 1.6 cm. Diaphragmatic lymph node on the left measures 2.2 cm. Paraesophageal lymph node measures 3.2 cm by 2.2 cm. No confluent airspace disease. Several small nodules are identified within lingula, right middle lobe, right lower lobe, and left lower lobe in the sulcus. Largest measures 5 mm in the lingula. Abdomen/pelvis: Multiple nodules/masses within the liver parenchyma, new from the comparison CT of 2008. Ill-defined mineralization/calcification within the infiltrative lesions of the left lobe. Surgical changes of splenectomy. Surgical changes of lap banding. Soft tissue mass at the descending duodenum/pancreatic head, with poor definition of the small bowel and the pancreas. Soft tissue measures 4.9 cm x 4.4 cm. Ill-defined circumferential soft tissue thickening of the splenic flexure with ill-defined mineralization/calcifications extending towards the greater curvature of the stomach. Greatest size of this soft tissue mass measures 4.7 cm x 4.4 cm. There is abrupt transition of both the transverse colon and the descending colon after the soft tissue mass. Multiple soft tissue nodule/peritoneal lymph nodes. Partially calcified/mineralized soft tissue mass in the right upper quadrant, adjacent to the greater curvature. Surgical changes of left nephrectomy. Unremarkable left adrenal gland. Unremarkable right adrenal gland. No evidence of right-sided hydronephrosis. Nonobstructive stone measures 5 mm -6 mm at the superior collecting system. Unremarkable course of the right ureter. Unremarkable appearance of the urinary bladder. Diffuse calcifications  of the abdominal vasculature.  No aneurysm. Hysterectomy. No displaced fracture. Multilevel degenerative changes of the visualized thoracolumbar spine. Facet changes bilaterally. Degenerative changes of the bilateral hips. IMPRESSION: Evidence of metastatic disease involving the liver, multiple nodal stations (including pericardial, paraesophageal, left diaphragmatic, portacaval), and peritoneum of the left upper quadrant. Mineralized/calcified mass inseparable from the left hepatic flexure is suspicious for the primary malignancy. Recommend correlation with colonoscopy. If further imaging is warranted, consider contrast-enhanced CT abdomen. Multiple small nodules of the inferior lungs, suspicious for metastases. Contrast-enhanced chest CT recommended for further evaluation. These results were called by telephone at the time of interpretation on 03/02/2016 at 5:41 pm to Dr. Sloan Leiter who verbally acknowledged these results. Surgical changes of prior left nephrectomy. Nonobstructive kidney stone of the right superior collecting system. Atherosclerosis with evidence of coronary artery disease. Signed, Dulcy Fanny. Earleen Newport, DO [facility] Radiology Electronically Signed   By: Corrie Mckusick D.O.   On: 03/02/2016 17:49   Dg Chest 2 View  03/02/2016  CLINICAL DATA:  Weakness for 2 days. EXAM: CHEST  2 VIEW COMPARISON:  07/20/2012 radiograph FINDINGS: Cardiomegaly, coronary stent and mild elevation  of the right hemidiaphragm again noted. There is no evidence of focal airspace disease, pulmonary edema, suspicious pulmonary nodule/mass, pleural effusion, or pneumothorax. No acute bony abnormalities are identified. Remote left rib fractures are identified. IMPRESSION: Cardiomegaly without evidence of acute cardiopulmonary disease. Electronically Signed   By: Margarette Canada M.D.   On: 03/02/2016 18:12   Ct Head Wo Contrast  03/02/2016  CLINICAL DATA:  Increasing weakness, anorexia,  hypertension, diabetes mellitus, history MI and stroke EXAM: CT HEAD WITHOUT CONTRAST TECHNIQUE: Contiguous axial images were obtained from the base of the skull through the vertex without intravenous contrast. COMPARISON:  07/19/2012 FINDINGS: Generalized atrophy. Normal ventricular morphology. No midline shift or mass effect. Small vessel chronic ischemic changes of deep cerebral white matter. 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 Old lacunar infarcts BILATERAL thalamus. No intracranial hemorrhage, mass lesion, or acute infarction. Visualized paranasal sinuses and mastoid air cells clear. Bones unremarkable. IMPRESSION: Atrophy with small vessel chronic ischemic changes of deep cerebral white matter. 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 No acute intracranial abnormalities. Electronically Signed   By: Lavonia Dana M.D.   On: 03/02/2016 12:16   Ct Chest Wo Contrast  03/04/2016  CLINICAL DATA:  Evidence of metastatic colon carcinoma by prior CT with small bilateral lung nodules that the visualized lung bases. IV contrast was not administered due to history of contrast allergy and prior nephrectomy. EXAM: CT CHEST WITHOUT CONTRAST TECHNIQUE: Multidetector CT imaging of the chest was performed following the standard protocol without IV contrast. COMPARISON:  CT of the abdomen and pelvis on 03/02/2016 FINDINGS: Mediastinum/Nodes: Metastatic disease in the chest is present with soft tissue masses in the mediastinum. There is a roughly 1.7 x 2.0 cm mass and an immediate adjacent 1.7 cm mass located in the right infrahilar region just lateral to the right atrium. Elongated soft tissue mass in the anterior mediastinum abutting the pericardium axial may be 2 adjacent masses and measures in conglomerate dimensions approximately 1.6 x 4.3 cm. Cardiophrenic angle mass seen on the abdominal CT measures 1.4 x 2.3 cm. Paraesophageal mass in the posterior mediastinum just above the diaphragm measures approximately  2.6 x 4.0 cm. Proximal mildly prominent right hilar lymph node tissue measuring roughly 1.2 cm in short axis. Coronary atherosclerosis present with calcified plaque in a 3 vessel distribution. The heart size is normal. No pericardial fluid present. The thoracic aorta is normal in caliber. Lungs/Pleura: The only discrete lung nodule is the inferior lingular nodule seen by abdominal CT measuring approximately 3 x 5 mm. This nodule is noncalcified. No other discrete pulmonary nodules are identified. There is mild atelectasis at both lung bases. Minimal trace left pleural effusion. No edema or airspace consolidation. No airway obstruction. Upper abdomen: Visualized upper abdomen shows a laparoscopic band and calcified lesions in the visible upper liver. Musculoskeletal: No bone metastases or fractures are identified in the chest. IMPRESSION: 1. Metastatic soft tissue masses in the mediastinum most likely representing metastatic lymphadenopathy. The paraesophageal mass may represent direct extension of tumor through the hiatus. 2. The only discrete pulmonary nodule is a well-circumscribed 3 x 5 mm nodule in the inferior lingula which may be benign. 3. Coronary atherosclerosis with calcified plaque in a 3 vessel distribution. Electronically Signed   By: Aletta Edouard M.D.   On: 03/04/2016 19:08     PERTINENT LAB RESULTS: CBC:  Recent Labs  03/06/16 0926  WBC 14.4*  HGB 9.5*  HCT 28.8*  PLT 406*   CMET CMP  Component Value Date/Time   NA 135 03/04/2016 0815   NA 133* 01/30/2016 1017   K 4.0 03/04/2016 0815   CL 105 03/04/2016 0815   CO2 21* 03/04/2016 0815   GLUCOSE 123* 03/04/2016 0815   GLUCOSE 155* 01/30/2016 1017   BUN 26* 03/04/2016 0815   BUN 12 01/30/2016 1017   CREATININE 1.03* 03/04/2016 0815   CALCIUM 9.0 03/04/2016 0815   PROT 7.2 03/03/2016 0431   PROT 7.1 01/30/2016 1017   ALBUMIN 2.4* 03/03/2016 0431   ALBUMIN 3.1* 01/30/2016 1017   AST 68* 03/03/2016 0431   ALT 18  03/03/2016 0431   ALKPHOS 166* 03/03/2016 0431   BILITOT 1.6* 03/03/2016 0431   BILITOT 0.8 01/30/2016 1017   GFRNONAA 57* 03/04/2016 0815   GFRAA >60 03/04/2016 0815    GFR Estimated Creatinine Clearance: 50.7 mL/min (by C-G formula based on Cr of 1.03). No results for input(s): LIPASE, AMYLASE in the last 72 hours. No results for input(s): CKTOTAL, CKMB, CKMBINDEX, TROPONINI in the last 72 hours. Invalid input(s): POCBNP No results for input(s): DDIMER in the last 72 hours. No results for input(s): HGBA1C in the last 72 hours. No results for input(s): CHOL, HDL, LDLCALC, TRIG, CHOLHDL, LDLDIRECT in the last 72 hours. No results for input(s): TSH, T4TOTAL, T3FREE, THYROIDAB in the last 72 hours.  Invalid input(s): FREET3 No results for input(s): VITAMINB12, FOLATE, FERRITIN, TIBC, IRON, RETICCTPCT in the last 72 hours. Coags: No results for input(s): INR in the last 72 hours.  Invalid input(s): PT Microbiology: Recent Results (from the past 240 hour(s))  Culture, blood (Routine X 2) w Reflex to ID Panel     Status: None   Collection Time: 03/02/16  5:45 PM  Result Value Ref Range Status   Specimen Description RIGHT ANTECUBITAL  Final   Special Requests BOTTLES DRAWN AEROBIC ONLY 5CC  Final   Culture NO GROWTH 5 DAYS  Final   Report Status 03/07/2016 FINAL  Final  Culture, blood (Routine X 2) w Reflex to ID Panel     Status: None   Collection Time: 03/02/16  5:51 PM  Result Value Ref Range Status   Specimen Description BLOOD RIGHT HAND  Final   Special Requests IN PEDIATRIC BOTTLE 3CC  Final   Culture NO GROWTH 5 DAYS  Final   Report Status 03/07/2016 FINAL  Final     BRIEF HOSPITAL COURSE:  Brief Narrative: Patient is a 65 y.o. female medical history significant of recurrent CVAs, hypertension, probable vascular dementia, diabetes, noncompliant with her medications for the past few months-brought to the ED forr evaluation of weight loss, loss of appetite, worsening  generalized weakness to the extent that she is now bedbound. She has had significant decline in her functional and cognitive status over the past few months. CT scan of the abdomen positive probable metastatic colon cancer. Underwent colonoscopy 5/12-which confirmed left splenic flexure mass that was nearly obstructing.Subsequently seen by general surgery to see if patient was a candidate for a palliative diverting colostomy, however not felt to be a candidate for any surgical procedure. Biopsy Results are still pending at the time of discharge. Seen by palliative care, Gen. surgery, gastroenterology and oncology during this hospital stay.  Hospital course by problem list Failure to thrive syndrome: Likely secondary to metastatic colon cancer. Given the rapid decline in cognitive and functional status, consulted palliative care consulted for goals of care before proceeding with any further workup at this point. Subsequently oncology consultation was obtained to see  if oncology felt that patient would be a candidate for palliative chemotherapy. After further discussion, family wanted to pursue biopsy, GI consultation was obtained, and patient underwent a colonoscopy on 5/12-which confirmed a splenic flexure mass. Continues to exhibit failure to thrive syndrome with very poor oral intake, she is mostly confused. She is felt to have very poor overall prognoses and these being discharged home with hospice services.  Malignant Neoplasm of the splenic flexure:Colonoscopy on 5/12- confirmed a splenic flexure mass-which is near obstructing, per GI MD -not a great candidate for stenting as mass near splenic flexure, after speaking with patient's family-husband-a general surgery consultation was obtained. Spoke with Dr.Burke Grandville Silos on 5/13, he advised the patient is not a candidate for palliative diverting colostomy. He felt it was appropriate to discharge patient home with hospice. Please note, patient's prealbumin  level was 2.8. At this time, patient does not have any obstructive symptoms or signs. Please also note, patient's colonoscopy biopsy results are still pending and will need to be followed. I have left a message for new patient navigator the cancer center to see if the patient can get a follow-up appointment. I've also sent a message via Epic to Dr. Irene Limbo, to see if patient can get a one-time follow-up visit as well.  Leukocytosis: UA/chest x-ray/CT abd and chest negative for any infective foci. Blood cultures negative so far.No foci evident on exam. Leukocytosis down trending-patient was monitored off  antibiotics.  AKI (with solitary kidney):Improved-but high risk of recurrence given ongoing failure to thrive symp and poor oral intake. Likely secondary to prerenal azotemia in a setting of poor oral intake and dehydration.Avoid nephrotoxic agents.  History of recurrent CVA: Unfortunately noncompliant with antiplatelets including Plavix for the past few months. Continue supportive care  History of left nephrectomy: Remote history-spouse not aware of exactly why nephrectomy done-but he suspects malignancy was the cause.  History of type 2 diabetes: Noncompliant with oral hypoglycemics-off meds for the past few months-as very poor oral intake-CBGs stable with SSI while inpatient. Hemoglobin A1c 5.4.  Avoid restarting oral hypoglycemics at this point.  Hypertension: Noncompliant with antihypertensives-was not taking any medications for the past few months-blood pressure is reasonably controlled without any antihypertensives.   Suspected dementia/hypersomnolence: Follows with neurology in the outpatient setting-suspect excessive sleepiness-likely part of failure to thrive syndrome and natural decline of this patient.  Severe protein calorie malnutrition: Prealbumin of 2.8!!Nutrition consult appreciated-continue supplement on discharge  Palliative care: Unfortunate 65 year old with history of recurrent  CVA-she has had significant decline in her functional and cognitive status in the past few months. She has stopped taking all of her medications. She has had significant loss of appetite and weight loss over the past few months-her generalized weakness has worsened to the extent that she has been bedbound for the past 1 month. Further evaluation revealed probable metastatic colon cancer-confirmed on colonoscopy. Mass was nearly obstructing, seen by general surgery and not felt to be a candidate for diverting colostomy. During this hospital stay, patient's spouse has had extensive discussion with this M.D and the palliative care team. In fact before any further workup was started, we engaged the palliative care team to talk with family as the patient was felt to be a very poor candidate for any sort of treatment. After long discussion, patient was transitioned to DO NOT RESUSCITATE status. Family is agreeable to take patient home with hospice care. Patient is not yet fully comfort care at this point, but suspect that with continued deterioration-will need  to be transitioned to full comfort care measures alone in the near future  TODAY-DAY OF DISCHARGE:  Subjective:   Threasa Heads today Remains pleasantly confused.  Objective:   Blood pressure 145/74, pulse 105, temperature 98.7 F (37.1 C), temperature source Oral, resp. rate 20, weight 71.714 kg (158 lb 1.6 oz), last menstrual period 12/28/2012, SpO2 100 %.  Intake/Output Summary (Last 24 hours) at 03/08/16 1600 Last data filed at 03/08/16 1415  Gross per 24 hour  Intake    380 ml  Output      6 ml  Net    374 ml   Filed Weights   03/05/16 0500 03/06/16 0500 03/07/16 0419  Weight: 67.314 kg (148 lb 6.4 oz) 69.083 kg (152 lb 4.8 oz) 71.714 kg (158 lb 1.6 oz)    Exam Awake , Pleasantly confused.No new F.N deficits [place].AT,PERRAL Supple Neck,No JVD, No cervical lymphadenopathy appriciated.  Symmetrical Chest wall movement, Good air  movement bilaterally, CTAB RRR,No Gallops,Rubs or new Murmurs, No Parasternal Heave +ve B.Sounds, Abd Soft, Non tender, No organomegaly appriciated, No rebound -guarding or rigidity. No Cyanosis, Clubbing or edema, No new Rash or bruise  DISCHARGE CONDITION: Stable  DISPOSITION: Home with Hospice  DISCHARGE INSTRUCTIONS:    Activity:  As tolerated with Full fall precautions use walker/cane & assistance as needed  Get Medicines reviewed and adjusted: Please take all your medications with you for your next visit with your Primary MD  Please request your Primary MD to go over all hospital tests and procedure/radiological results at the follow up, please ask your Primary MD to get all Hospital records sent to his/her office.  If you experience worsening of your admission symptoms, develop shortness of breath, life threatening emergency, suicidal or homicidal thoughts you must seek medical attention immediately by calling 911 or calling your MD immediately  if symptoms less severe.  You must read complete instructions/literature along with all the possible adverse reactions/side effects for all the Medicines you take and that have been prescribed to you. Take any new Medicines after you have completely understood and accpet all the possible adverse reactions/side effects.   Do not drive when taking Pain medications.   Do not take more than prescribed Pain, Sleep and Anxiety Medications  Special Instructions: If you have smoked or chewed Tobacco  in the last 2 yrs please stop smoking, stop any regular Alcohol  and or any Recreational drug use.  Wear Seat belts while driving.  Please note  You were cared for by a hospitalist during your hospital stay. Once you are discharged, your primary care physician will handle any further medical issues. Please note that NO REFILLS for any discharge medications will be authorized once you are discharged, as it is imperative that you return to your  primary care physician (or establish a relationship with a primary care physician if you do not have one) for your aftercare needs so that they can reassess your need for medications and monitor your lab values.   Diet recommendation: Regular diet   Discharge Instructions    Call MD for:  difficulty breathing, headache or visual disturbances    Complete by:  As directed      Call MD for:  severe uncontrolled pain    Complete by:  As directed      Diet - low sodium heart healthy    Complete by:  As directed      Increase activity slowly    Complete by:  As directed  Follow-up Information    Follow up with Hospice at [facility].   Specialty:  Hospice and Palliative Medicine   Contact information:   [place] [address] [phone]       Schedule an appointment as soon as possible for a visit with Philis Fendt, MD.   Specialty:  Internal Medicine   Why:  As needed   Contact information:   [place] [address] [phone]       Schedule an appointment as soon as possible for a visit with Lenor Coffin, MD.   Specialty:  Neurology   Why:  As needed   Contact information:   [address] [place] [address] [phone]       Follow up with Sullivan Lone, MD. Schedule an appointment as soon as possible for a visit in 1 week.   Specialties:  Hematology, Oncology   Why:  Hospital follow up-follow results of colonoscopy biopsy (at cancer doctors office)   Contact information:   [address] [place] [address] [phone]       Total Time spent on discharge equals  45 minutes.  SignedOren Binet 03/08/2016 4:00 PM

## 2016-03-08 NOTE — Progress Notes (Addendum)
Spoke with [place] who has been in constant contact with husband. Planning to receive pt home today, still in the process of getting DME into home. Collie Siad has encouraged him to call [facility] and have DME delivered sooner rather than later. MD has approved discharge for today and [facility] will transport. RN will need to let [employer] know when pt is discharged by calling Collie Siad @ [phone].

## 2016-03-08 NOTE — Progress Notes (Addendum)
Received call from [place],  Bedside RN on 19M to assess if arrangements with Hospice had been made for this pt in anticipation of discharge to home today. Last RNCM note indicate husband planned to receive DME today but had to move some furniture in his home this am wieir sh "[employer]"  in order to do so. Spouse also cares for their daughter with Cerebral Palsy. Called [facility] @ [phone] and LM for return call as Gillian Shields, RNCM had initiated this process and it looks like all involved anticipated discharge today including Ghimire, MD.  Received call from [place] for [facility] to [facility] transfers [phone]). She has spoken with Mr Makarewicz who is in process of moving furniture and DME is to be delivered. Admission to [facility] is scheduled for 1400 today. CM spoke with Sloan Leiter, MD to make him aware of need for hard prescriptions for anti anxiety agent and analgesic as pt has been taking Versed and Fentanyl while in the hospital. MD reports that surgery will need to clear pt today before she can be discharged. Pt is unaware that she is being discharged to hospice.

## 2016-03-08 NOTE — Progress Notes (Signed)
Spoke with general surgeon-Dr. Grandville Silos He advised that the patient was not a candidate for any sort of surgical procedure and advised that we discharge patient home with hospice. Please see discharge summary for further details

## 2016-03-08 NOTE — Progress Notes (Signed)
RN spoke with pt spouse Mr. Chesebro who said pt home hospital bed was still not delivered and was waiting for them to come deliver and set up between 6 and 630pm so RN should reschedule wife transportation. [facility] called and rescheduled to [phone]. Pt cleaned and changed into clean hospital gown; telemetry and IV removed; pt discharge education and instructions completed. Pt discharge home with [facility] to transport her to disposition. Pt awaiting on [facility] for transportion; pt pm insulin dose held d/t pt refusing dinner. Reported off to coming RN to continue to monitor pt till pt pick up. P. Angelica Pou RN

## 2016-03-08 NOTE — Progress Notes (Signed)
[place] [facility] RN Liason Note   I have been in contact with Mr. Lide regarding status of discharge.  He is currently working on making space in the home to receive the Hospital bed and other ordered DME.  [facility] will be coordinating delivery with Mr. Muffoletto for later this afternoon.  Per chart review patient  Still waiting on surgical consult to determine if patient might be a candidate for a diverting colostomy.   Spouse verbalized that his preference would be for patient to discharge tomorrow rather than today if at all possible.  Discussed mode of transportation with him and he has agreed to transport her home via [place].    Please send patient home with RX for comfort medications.  Please send signed DNR with patient upon discharge.   Please notify [facility] when patient is ready for discharge at [phone]  Thank you.   Call with any Hospice related questions or concerns.    Mickie Kay, [facility] Liason  [phone]

## 2016-03-09 ENCOUNTER — Encounter (HOSPITAL_COMMUNITY): Payer: Self-pay | Admitting: Gastroenterology

## 2016-03-12 ENCOUNTER — Telehealth: Payer: Self-pay | Admitting: Hematology

## 2016-03-12 NOTE — Telephone Encounter (Signed)
S.w. Pt and confirmed appt....the patient ok and aware

## 2016-03-13 ENCOUNTER — Encounter: Payer: Self-pay | Admitting: *Deleted

## 2016-03-13 NOTE — Progress Notes (Signed)
[facility] Work  Clinical Social Work was referred by scheduling department for transportation concerns regarding upcoming appointment.  Clinical Social Worker contacted patient's spouse to offer support and assess for needs.  Mr. Bulger reported patient is currently under hospice care, he met with Hospice RN and LCSW yesterday.  Patient's spouse shared he is not sleeping well- he has not shared patient's condition with their adult daughter with cerebral palsy.  CSW provided brief emotional support and validated patient's spouses feelings of sadness and anxiety.    Patient agreed to wait and see how spouse feels in the next week and call cancer center to determine if he can bring patient into center to meet with medical oncologist.  \  CSW left voicemail with Anthoney Harada, LCSW, to discuss case.   Polo Riley, MSW, LCSW, OSW-C Clinical Social Worker [facility] [phone]

## 2016-03-17 ENCOUNTER — Inpatient Hospital Stay: Payer: Medicare (Managed Care) | Admitting: Hematology

## 2016-03-20 ENCOUNTER — Inpatient Hospital Stay: Payer: Medicare (Managed Care) | Admitting: Hematology

## 2016-04-15 ENCOUNTER — Telehealth: Payer: Self-pay | Admitting: Neurology

## 2016-04-15 NOTE — Telephone Encounter (Signed)
I called Stanton Kidney, left a message. Okay to discontinue the Seroquel for this patient, this can be restarted if needed in the future.

## 2016-04-15 NOTE — Telephone Encounter (Signed)
Returned call and spoke to [facility]. She reports that pt is to the point that she is comfort care only. Pt's husband was hospitalized d/t CVA last wk and has had subsequent confusion. Due to husband's illness and presence of multiple caregivers, Stanton Kidney says that pt has not had her Seroquel in over a week but has tolerated missed doses well. She was calling to ask if all right just to continue prn meds for comfort and to not restart Seroquel.

## 2016-04-15 NOTE — Telephone Encounter (Signed)
[name] [id number] called regarding update on patient status and need to discontinue medication, wishes to get authorization.

## 2016-04-22 ENCOUNTER — Telehealth: Payer: Self-pay | Admitting: Neurology

## 2016-04-22 NOTE — Telephone Encounter (Signed)
Orders placed in Dr. Jannifer Franklin' office for signature.

## 2016-04-22 NOTE — Telephone Encounter (Signed)
[name] with Hospice and Palliative Care with [facility] needs a certification of terminal illness signed along with two sets of orders. She has faxed it once , 04/01/16 , 04/11/16. I have asked her to fax to my office [phone] . Please fax to [phone], phone: [phone] direct to [name].

## 2016-05-21 ENCOUNTER — Telehealth: Payer: Self-pay | Admitting: Neurology

## 2016-05-21 MED ORDER — DIAZEPAM 1 MG/ML PO SOLN: 2.0000 mg | Freq: Four times a day (QID) | ORAL | 1 refills | Status: AC | PRN

## 2016-05-21 NOTE — Telephone Encounter (Signed)
[facility]y/[facility] [phone] called to advise, patient's husband has requested DNR be reversed, patient is now in full code, states patient is declining, nurse will go out today.

## 2016-05-21 NOTE — Telephone Encounter (Signed)
I called [name], left a message, I will write a prescription for diazepam for agitation. This will be sent to her pharmacy.

## 2016-05-21 NOTE — Telephone Encounter (Signed)
Spoke to Hospice RN, Almyra Free. Reports that she has seen pt and counseled family on code status. She feels that pt's husband understands that resuscitating pt will more than likely not change outcome. Family will make a decision by tomorrow whether or not to continue DNR. Pt's sister says that pt is not sleeping well at night, "very restless and picking." Pt is currently not taking POs but tolerating liquid morphine or sublingual medications. Family/Hospice would like an order for medication to help w/ agitation/restlessness sent to [facility]'s [facility] on [address] as well as to [facility] office.

## 2016-05-21 NOTE — Telephone Encounter (Signed)
Julie/Hospice called to advise, patient is transitioning toward end of life, husband reversed code status, patient is having restlestness and agitation, usually they have standing orders, Dr. Jannifer Franklin hasn't approved yet. Skyped nurse Anderson Malta who took the call.

## 2016-05-22 ENCOUNTER — Telehealth: Payer: Self-pay | Admitting: Neurology

## 2016-05-22 ENCOUNTER — Telehealth: Payer: Self-pay | Admitting: *Deleted

## 2016-05-22 NOTE — Telephone Encounter (Signed)
Valium rx signed faxed and confirmed to [facility] at [phone].

## 2016-05-22 NOTE — Telephone Encounter (Signed)
Abigail Butts with [facility] called to advise the patient died at 3pm today.

## 2016-05-27 DEATH — deceased

## 2016-08-07 ENCOUNTER — Ambulatory Visit: Payer: Medicare Other | Admitting: Neurology

## 2017-06-26 IMAGING — CT CT HEAD W/O CM
3 series · 14 of 47 positions shown, 16 images · non-contrast
Comparison: 07/19/2012

CLINICAL DATA: Increasing weakness, anorexia, hypertension,
diabetes mellitus, history MI and stroke

EXAM:
CT HEAD WITHOUT CONTRAST
TECHNIQUE: Contiguous axial images were obtained from the base of the skull
through the vertex without intravenous contrast.

[Series 3: head 3.0 mpr · coronal · 0.29mm/px · 3 of 66 slices shown (1 of 2)]
[im 22/66  brain]
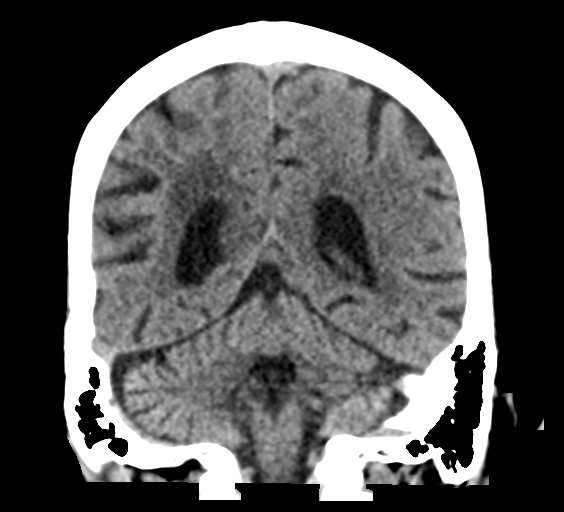
[im 29/66  brain]
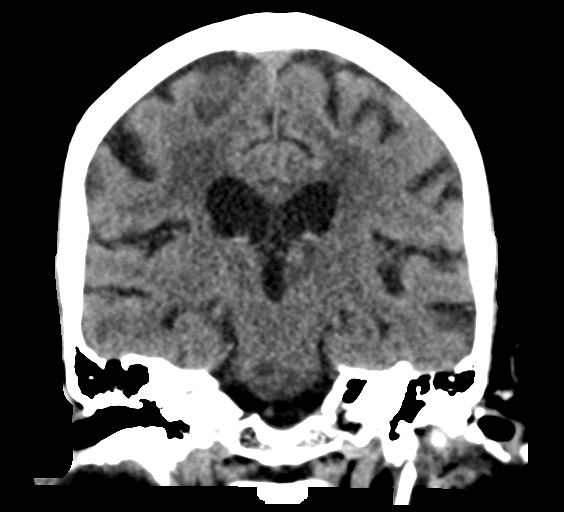
[im 37/66  brain]
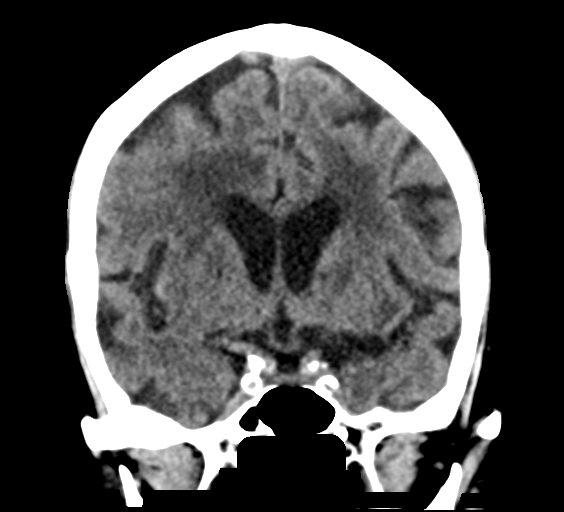

[Series 4: head 3.0 mpr · sagittal · 0.29mm/px · 3 of 54 slices shown (2 of 2)]
[im 18/54  brain]
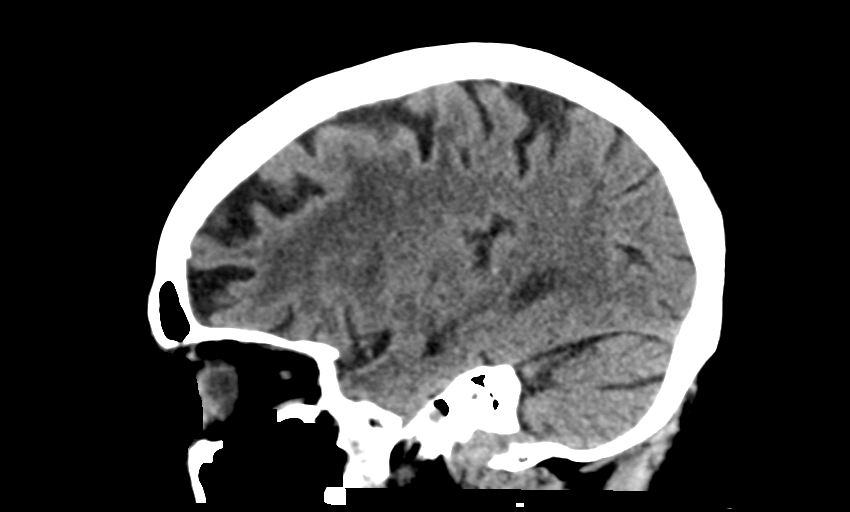
[im 27/54  brain]
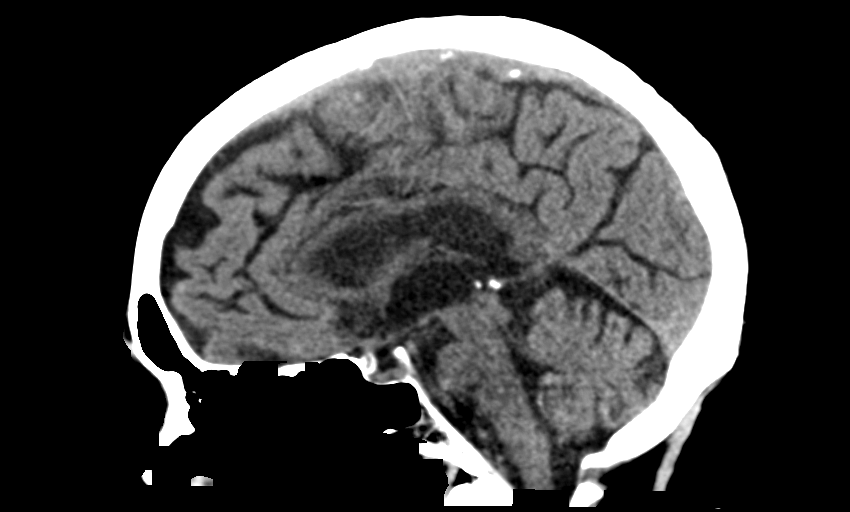
[im 36/54  brain]
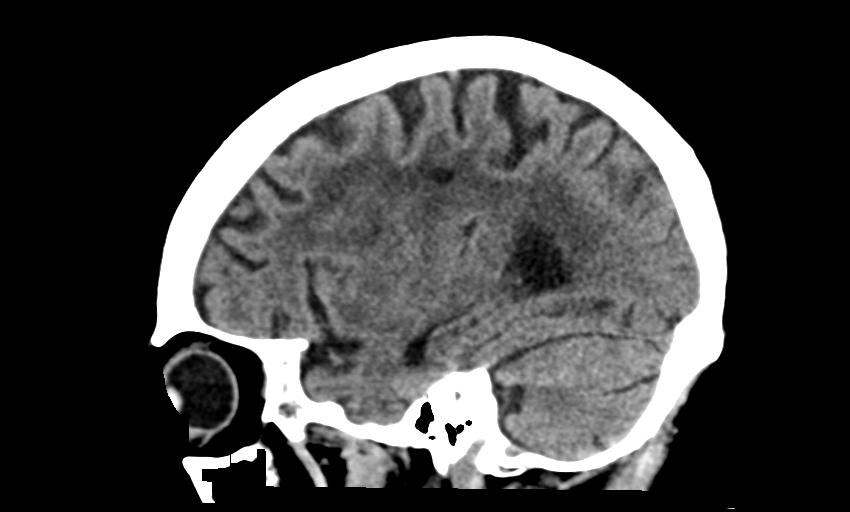

[Series 5: head 5.0 h30s · axial · 0.41mm/px · z∈[+1103,+1228]mm · 8 of 30 slices shown, 10 images]
[im 3/30  brain]
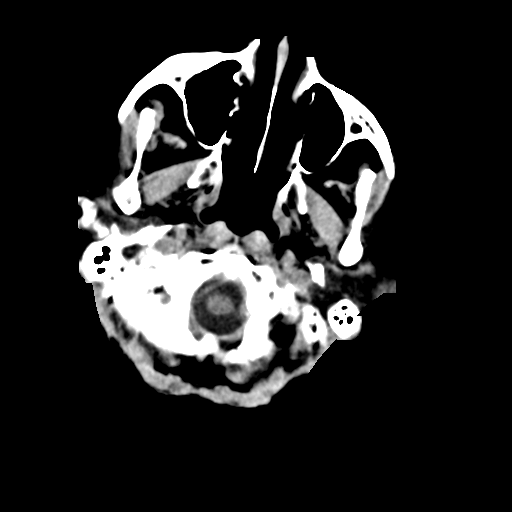
[im 3/30  bone]
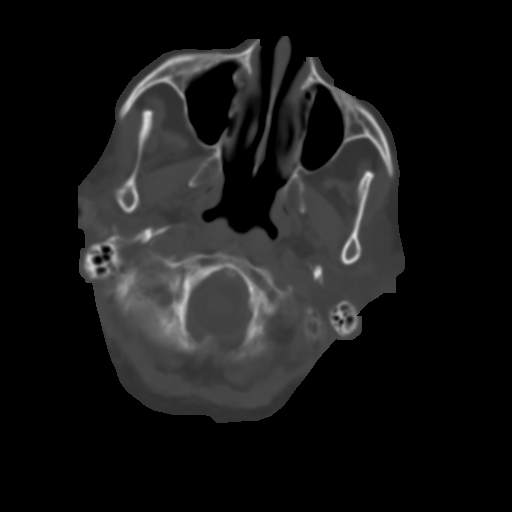
[im 7/30  brain]
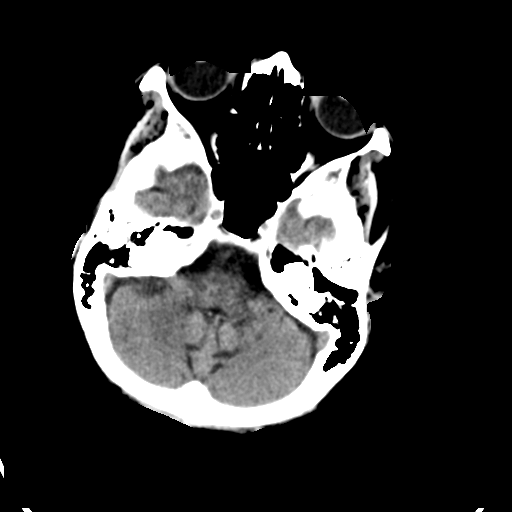
[im 10/30  brain]
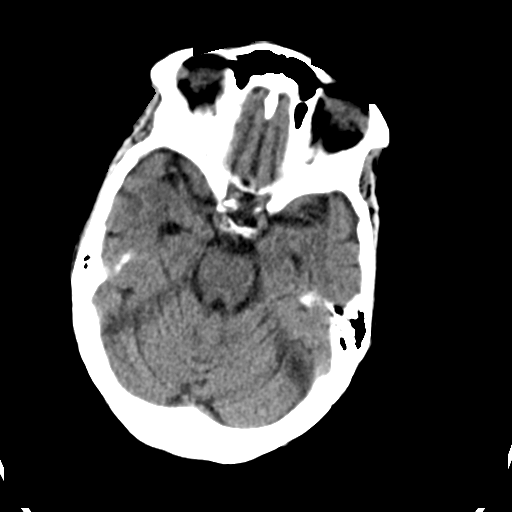
[im 14/30  brain]
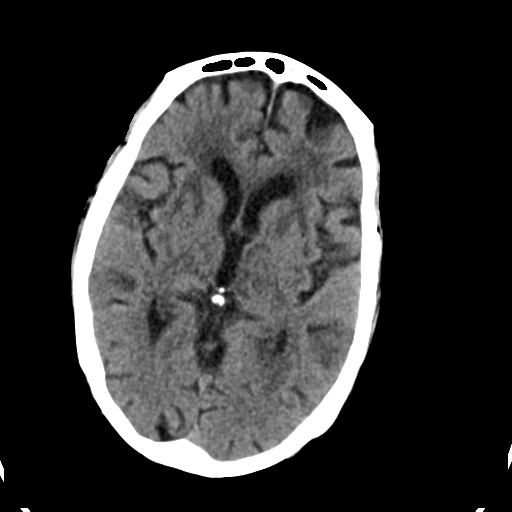
[im 17/30  brain]
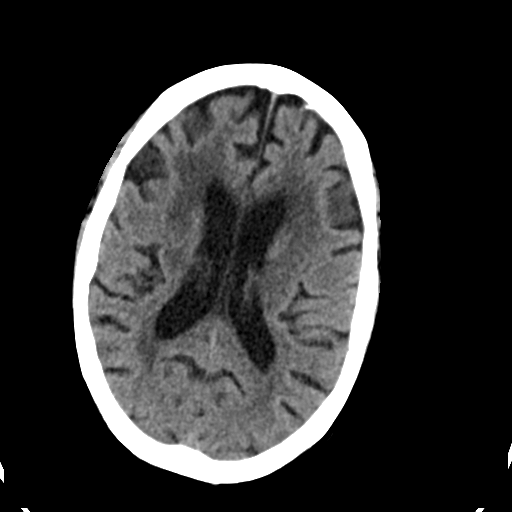
[im 17/30  bone]
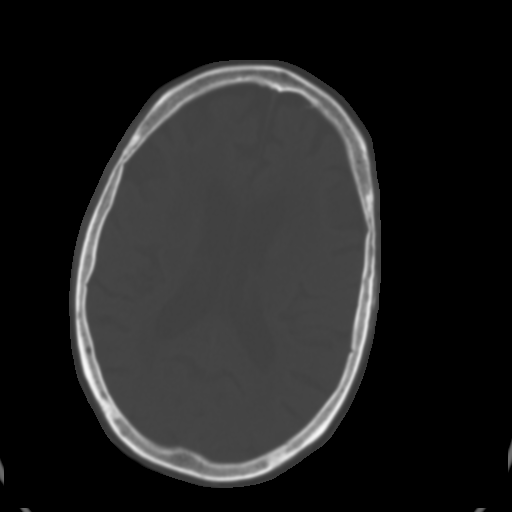
[im 21/30  brain]
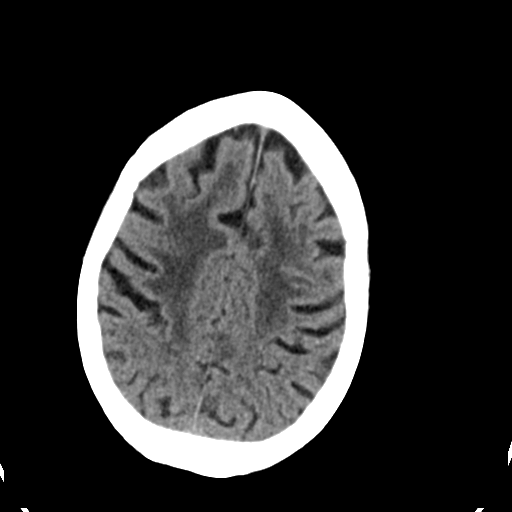
[im 24/30  brain]
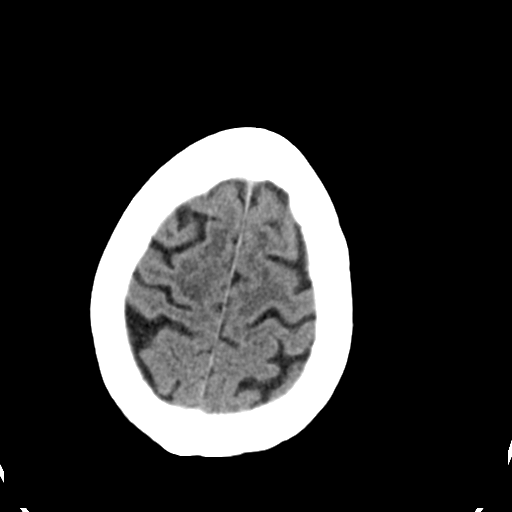
[im 28/30  brain]
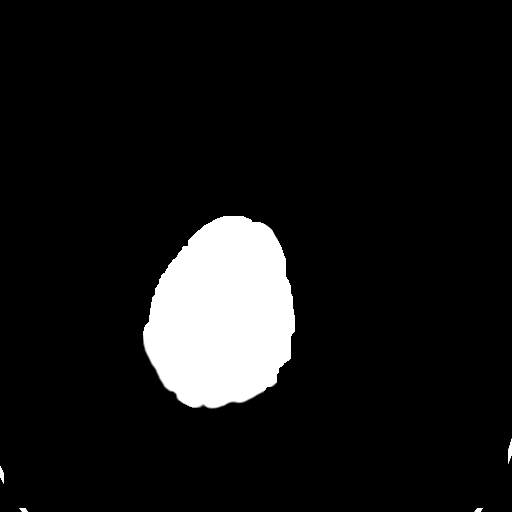

[14 of 47 positions shown; findings below may reference images not displayed]

FINDINGS: Generalized atrophy.

Normal ventricular morphology.

No midline shift or mass effect.

Small vessel chronic ischemic changes of deep cerebral white matter.

Old RIGHT central pontine infarct.

Old lacunar infarcts BILATERAL thalamus.

No intracranial hemorrhage, mass lesion, or acute infarction.

Visualized paranasal sinuses and mastoid air cells clear.

Bones unremarkable.
IMPRESSION: Atrophy with small vessel chronic ischemic changes of deep cerebral
white matter.

Old lacunar infarcts RIGHT pons and BILATERAL thalamus .

No acute intracranial abnormalities.

## 2017-12-23 NOTE — Telephone Encounter (Signed)
Error
